# Patient Record
Sex: Female | Born: 1985
Health system: Southern US, Community
[De-identification: ages and names within clinical notes are randomized; demographics above are authoritative.]

## PROBLEM LIST (undated history)

## (undated) ENCOUNTER — Inpatient Hospital Stay (HOSPITAL_COMMUNITY): Payer: Self-pay

## (undated) DIAGNOSIS — F32A Depression, unspecified: Secondary | ICD-10-CM

## (undated) DIAGNOSIS — T7840XA Allergy, unspecified, initial encounter: Secondary | ICD-10-CM

## (undated) DIAGNOSIS — J45909 Unspecified asthma, uncomplicated: Secondary | ICD-10-CM

## (undated) DIAGNOSIS — F419 Anxiety disorder, unspecified: Secondary | ICD-10-CM

## (undated) DIAGNOSIS — N200 Calculus of kidney: Secondary | ICD-10-CM

## (undated) DIAGNOSIS — Z8659 Personal history of other mental and behavioral disorders: Secondary | ICD-10-CM

## (undated) DIAGNOSIS — N159 Renal tubulo-interstitial disease, unspecified: Secondary | ICD-10-CM

## (undated) DIAGNOSIS — Z3400 Encounter for supervision of normal first pregnancy, unspecified trimester: Secondary | ICD-10-CM

## (undated) HISTORY — DX: Allergy, unspecified, initial encounter: T78.40XA

## (undated) HISTORY — PX: APPENDECTOMY: SHX54

## (undated) HISTORY — DX: Encounter for supervision of normal first pregnancy, unspecified trimester: Z34.00

## (undated) HISTORY — DX: Personal history of other mental and behavioral disorders: Z86.59

## (undated) HISTORY — DX: Depression, unspecified: F32.A

## (undated) HISTORY — DX: Anxiety disorder, unspecified: F41.9

---
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
## 2007-05-10 ENCOUNTER — Other Ambulatory Visit: Admission: RE | Admit: 2007-05-10 | Discharge: 2007-05-10 | Payer: Self-pay | Admitting: Obstetrics & Gynecology

## 2007-07-03 ENCOUNTER — Emergency Department (HOSPITAL_COMMUNITY): Admission: EM | Admit: 2007-07-03 | Discharge: 2007-07-03 | Payer: Self-pay | Admitting: Emergency Medicine

## 2008-05-29 ENCOUNTER — Other Ambulatory Visit: Admission: RE | Admit: 2008-05-29 | Discharge: 2008-05-29 | Payer: Self-pay | Admitting: Obstetrics and Gynecology

## 2009-01-14 ENCOUNTER — Emergency Department (HOSPITAL_COMMUNITY): Admission: EM | Admit: 2009-01-14 | Discharge: 2009-01-14 | Payer: Self-pay | Admitting: Emergency Medicine

## 2009-01-14 ENCOUNTER — Ambulatory Visit: Payer: Self-pay | Admitting: *Deleted

## 2009-01-14 ENCOUNTER — Inpatient Hospital Stay (HOSPITAL_COMMUNITY): Admission: AD | Admit: 2009-01-14 | Discharge: 2009-01-17 | Payer: Self-pay | Admitting: *Deleted

## 2009-01-15 ENCOUNTER — Emergency Department (HOSPITAL_COMMUNITY): Admission: EM | Admit: 2009-01-15 | Discharge: 2009-01-15 | Payer: Self-pay | Admitting: Emergency Medicine

## 2009-05-14 ENCOUNTER — Ambulatory Visit: Payer: Self-pay | Admitting: Diagnostic Radiology

## 2009-05-14 ENCOUNTER — Emergency Department (HOSPITAL_BASED_OUTPATIENT_CLINIC_OR_DEPARTMENT_OTHER): Admission: EM | Admit: 2009-05-14 | Discharge: 2009-05-14 | Payer: Self-pay | Admitting: Emergency Medicine

## 2010-07-20 LAB — DIFFERENTIAL
Band Neutrophils: 3 % (ref 0–10)
Basophils Relative: 0 % (ref 0–1)
Eosinophils Relative: 1 % (ref 0–5)
Lymphocytes Relative: 7 % — ABNORMAL LOW (ref 12–46)
Lymphs Abs: 2.1 10*3/uL (ref 0.7–4.0)
Monocytes Absolute: 1.8 10*3/uL — ABNORMAL HIGH (ref 0.1–1.0)

## 2010-07-20 LAB — BASIC METABOLIC PANEL
CO2: 24 mEq/L (ref 19–32)
Chloride: 101 mEq/L (ref 96–112)
GFR calc Af Amer: >60 mL/min (ref >60)
Sodium: 137 mEq/L (ref 135–145)

## 2010-07-20 LAB — URINALYSIS, ROUTINE W REFLEX MICROSCOPIC
Glucose, UA: NEGATIVE mg/dL
Ketones, ur: 15 mg/dL — AB
Nitrite: NEGATIVE
Specific Gravity, Urine: 1.02 (ref 1.005–1.030)
pH: 6.5 (ref 5.0–8.0)

## 2010-07-20 LAB — CBC
Hemoglobin: 12.6 g/dL (ref 12.0–15.0)
MCHC: 34.3 g/dL (ref 30.0–36.0)
MCV: 83.4 fL (ref 78.0–100.0)
RBC: 4.41 MIL/uL (ref 3.87–5.11)
WBC: 29.4 10*3/uL — ABNORMAL HIGH (ref 4.0–10.5)

## 2010-07-20 LAB — PREGNANCY, URINE: Preg Test, Ur: NEGATIVE

## 2010-07-20 LAB — URINE MICROSCOPIC-ADD ON

## 2010-08-08 LAB — BASIC METABOLIC PANEL
BUN: 9 mg/dL (ref 6–23)
CO2: 25 mEq/L (ref 19–32)
Calcium: 9 mg/dL (ref 8.4–10.5)
Creatinine, Ser: 0.87 mg/dL (ref 0.4–1.2)
GFR calc non Af Amer: >60 mL/min (ref >60)
Glucose, Bld: 121 mg/dL — ABNORMAL HIGH (ref 70–99)
Sodium: 136 mEq/L (ref 135–145)

## 2010-08-08 LAB — URINALYSIS, ROUTINE W REFLEX MICROSCOPIC
Bilirubin Urine: NEGATIVE
Bilirubin Urine: NEGATIVE
Glucose, UA: NEGATIVE mg/dL
Leukocytes, UA: NEGATIVE
Nitrite: NEGATIVE
Protein, ur: NEGATIVE mg/dL
Specific Gravity, Urine: 1.029 (ref 1.005–1.030)
Urobilinogen, UA: 0.2 mg/dL (ref 0.0–1.0)
pH: 6 (ref 5.0–8.0)

## 2010-08-08 LAB — HEPATIC FUNCTION PANEL
ALT: 18 U/L (ref 0–35)
AST: 26 U/L (ref 0–37)
Albumin: 3.2 g/dL — ABNORMAL LOW (ref 3.5–5.2)
Total Bilirubin: 0.4 mg/dL (ref 0.3–1.2)

## 2010-08-08 LAB — RAPID URINE DRUG SCREEN, HOSP PERFORMED
Barbiturates: NOT DETECTED
Benzodiazepines: NOT DETECTED
Cocaine: NOT DETECTED

## 2010-08-08 LAB — PREGNANCY, URINE: Preg Test, Ur: NEGATIVE

## 2010-08-08 LAB — URINE MICROSCOPIC-ADD ON

## 2010-08-08 LAB — DIFFERENTIAL
Basophils Absolute: 0 10*3/uL (ref 0.0–0.1)
Basophils Relative: 0 % (ref 0–1)
Lymphocytes Relative: 22 % (ref 12–46)
Monocytes Absolute: 1 10*3/uL (ref 0.1–1.0)
Neutro Abs: 11.7 10*3/uL — ABNORMAL HIGH (ref 1.7–7.7)
Neutrophils Relative %: 71 % (ref 43–77)

## 2010-08-08 LAB — CBC
MCHC: 34.5 g/dL (ref 30.0–36.0)
Platelets: 445 10*3/uL — ABNORMAL HIGH (ref 150–400)
RDW: 12.1 % (ref 11.5–15.5)

## 2010-08-08 LAB — ACETAMINOPHEN LEVEL: Acetaminophen (Tylenol), Serum: <10 ug/mL — ABNORMAL LOW (ref 10–30)

## 2011-01-26 LAB — POCT PREGNANCY, URINE: Operator id: 285491

## 2011-04-10 ENCOUNTER — Emergency Department (HOSPITAL_BASED_OUTPATIENT_CLINIC_OR_DEPARTMENT_OTHER)
Admission: EM | Admit: 2011-04-10 | Discharge: 2011-04-11 | Disposition: A | Discharge status: Home / Self Care | Payer: BC Managed Care – PPO | Attending: Emergency Medicine | Admitting: Emergency Medicine

## 2011-04-10 DIAGNOSIS — R51 Headache: Secondary | ICD-10-CM | POA: Insufficient documentation

## 2011-04-10 DIAGNOSIS — F17200 Nicotine dependence, unspecified, uncomplicated: Secondary | ICD-10-CM | POA: Insufficient documentation

## 2011-04-10 HISTORY — DX: Renal tubulo-interstitial disease, unspecified: N15.9

## 2011-04-10 HISTORY — DX: Calculus of kidney: N20.0

## 2011-04-10 NOTE — ED Notes (Signed)
Pt states that she has a migraine headache with photo and phono-phobia.  Pt states that she also has nausea, pain 10/10.  Pt states that she was followed by headache specialist but stopped going because it wasn't helping, also has RX for nasal imitrex but never took it because "last time it didn't help so this time I took nothing and came here".

## 2011-04-11 MED ORDER — METOCLOPRAMIDE HCL 5 MG/ML IJ SOLN
10.0000 mg | Freq: Once | INTRAMUSCULAR | Status: AC
  Administered 2011-04-11: 10 mg via INTRAVENOUS
  Filled 2011-04-11: qty 2

## 2011-04-11 MED ORDER — KETOROLAC TROMETHAMINE 15 MG/ML IJ SOLN
15.0000 mg | Freq: Once | INTRAMUSCULAR | Status: AC
  Administered 2011-04-11: 15 mg via INTRAVENOUS
  Filled 2011-04-11: qty 1

## 2011-04-11 MED ORDER — SODIUM CHLORIDE 0.9 % IV BOLUS (SEPSIS)
1000.0000 mL | Freq: Once | INTRAVENOUS | Status: AC
  Administered 2011-04-11: 1000 mL via INTRAVENOUS

## 2011-04-11 MED ORDER — DIPHENHYDRAMINE HCL 50 MG/ML IJ SOLN
25.0000 mg | Freq: Once | INTRAMUSCULAR | Status: AC
  Administered 2011-04-11: 25 mg via INTRAVENOUS
  Filled 2011-04-11: qty 1

## 2011-04-11 NOTE — ED Provider Notes (Signed)
History    25yf with ha. No trauma. Gradual onset a few days prior. No fever/chills. Diffuse and reltively constant. No appreciable exacerbating or relieving factors. Nausea. No vomting. Photo/phonopobia. Hx of similar HAs. Denies use of bleeding diathesis.   CSN: [id number]  Arrival date & time: 04/10/2011 11:09 PM   First MD Initiated Contact with Patient 04/11/11 0110      Chief Complaint  Patient presents with  . Headache    (Consider location/radiation/quality/duration/timing/severity/associated sxs/prior treatment) HPI  Past Medical History  Diagnosis Date  . Migraine   . Kidney stone   . Kidney infection   . Concussion     History reviewed. No pertinent past surgical history.  History reviewed. No pertinent family history.  History  Substance Use Topics  . Smoking status: Current Everyday Smoker -- 0.2 packs/day for 6 years    Types: Cigarettes  . Smokeless tobacco: Never Used  . Alcohol Use: Yes    OB History    Grav Para Term Preterm Abortions TAB SAB Ect Mult Living                  Review of Systems   Review of symptoms negative unless otherwise noted in HPI.   Allergies  Review of patient's allergies indicates no known allergies.  Home Medications   Current Outpatient Rx  Name Route Sig Dispense Refill  . ALPRAZOLAM 0.5 MG PO TABS Oral Take 0.5 mg by mouth 2 (two) times daily as needed. For anxiety    . ONE-DAILY MULTI VITAMINS PO TABS Oral Take 1 tablet by mouth daily.      Azzie Roup ACE-ETH ESTRAD-FE 1.5-30 MG-MCG PO TABS Oral Take 1 tablet by mouth daily.      . SUMATRIPTAN 20 MG/ACT NA SOLN Nasal Place 1 spray into the nose every 2 (two) hours as needed. For migraine      BP 102/54  Pulse 104  Temp(Src) 97.9 F (36.6 C) (Oral)  Resp 15  Ht 5\' 1"  (1.549 m)  Wt 180 lb (81.647 kg)  BMI 34.01 kg/m2  SpO2 98%  LMP 03/27/2011  Physical Exam  Nursing note and vitals reviewed. Constitutional: She is oriented to person, place, and  time. She appears well-developed and well-nourished. No distress.  HENT:  Head: Normocephalic and atraumatic.  Right Ear: External ear normal.  Left Ear: External ear normal.  Mouth/Throat: Oropharynx is clear and moist.  Eyes: Conjunctivae are normal. Pupils are equal, round, and reactive to light. Right eye exhibits no discharge. Left eye exhibits no discharge.  Neck: Normal range of motion. Neck supple.  Cardiovascular: Normal rate, regular rhythm and normal heart sounds.  Exam reveals no gallop and no friction rub.   No murmur heard. Pulmonary/Chest: Effort normal and breath sounds normal. No respiratory distress.  Abdominal: Soft. She exhibits no distension. There is no tenderness.  Musculoskeletal: She exhibits no edema and no tenderness.  Lymphadenopathy:    She has no cervical adenopathy.  Neurological: She is alert and oriented to person, place, and time. A cranial nerve deficit is present. She exhibits normal muscle tone. Coordination normal.       Normal appearing gait. Good finger to nose b/l.  Skin: Skin is warm and dry.  Psychiatric: She has a normal mood and affect. Her behavior is normal. Thought content normal.    ED Course  Procedures (including critical care time)  Labs Reviewed - No data to display No results found.   1. Headache  MDM  25yF with HA. Atraumatic. Suspect primary HA. Hx of similar. Nofocal neuro exam. Hx of prior HAs. Consider secondary causes such as bleed, infectious, mass/psuedo tumor, venous thrombosis, CO poisoning but clinically doubt. Symptoms improved after tx and no new complaints prior to DC. Requesting to go home. Outpt fu.         Raeford Razor, MD 04/15/11 [phone]

## 2011-04-11 NOTE — ED Notes (Signed)
Pt states that she now feels better and wishes to go home.

## 2011-08-28 ENCOUNTER — Encounter (HOSPITAL_BASED_OUTPATIENT_CLINIC_OR_DEPARTMENT_OTHER): Payer: Self-pay | Admitting: *Deleted

## 2011-08-28 ENCOUNTER — Emergency Department (HOSPITAL_BASED_OUTPATIENT_CLINIC_OR_DEPARTMENT_OTHER)
Admission: EM | Admit: 2011-08-28 | Discharge: 2011-08-28 | Disposition: A | Discharge status: Home / Self Care | Payer: BC Managed Care – PPO | Attending: Emergency Medicine | Admitting: Emergency Medicine

## 2011-08-28 DIAGNOSIS — F17200 Nicotine dependence, unspecified, uncomplicated: Secondary | ICD-10-CM | POA: Insufficient documentation

## 2011-08-28 DIAGNOSIS — N390 Urinary tract infection, site not specified: Secondary | ICD-10-CM | POA: Insufficient documentation

## 2011-08-28 DIAGNOSIS — M549 Dorsalgia, unspecified: Secondary | ICD-10-CM | POA: Insufficient documentation

## 2011-08-28 DIAGNOSIS — Z87442 Personal history of urinary calculi: Secondary | ICD-10-CM | POA: Insufficient documentation

## 2011-08-28 LAB — URINE MICROSCOPIC-ADD ON

## 2011-08-28 LAB — URINALYSIS, ROUTINE W REFLEX MICROSCOPIC
Nitrite: NEGATIVE
Specific Gravity, Urine: 1.041 — ABNORMAL HIGH (ref 1.005–1.030)
pH: 5.5 (ref 5.0–8.0)

## 2011-08-28 LAB — PREGNANCY, URINE: Preg Test, Ur: NEGATIVE

## 2011-08-28 MED ORDER — SULFAMETHOXAZOLE-TRIMETHOPRIM 800-160 MG PO TABS: 1.0000 | ORAL_TABLET | Freq: Two times a day (BID) | ORAL | Status: AC

## 2011-08-28 MED ORDER — PHENAZOPYRIDINE HCL 200 MG PO TABS: 200.0000 mg | ORAL_TABLET | Freq: Three times a day (TID) | ORAL | Status: AC

## 2011-08-28 MED ORDER — PHENAZOPYRIDINE HCL 200 MG PO TABS: 200.0000 mg | ORAL_TABLET | Freq: Three times a day (TID) | ORAL | Status: DC

## 2011-08-28 NOTE — ED Notes (Signed)
Pt c/o lower back pain, difficulty with urination, painful urination, x 2 days

## 2011-08-28 NOTE — ED Provider Notes (Signed)
History     CSN: [id number]  Arrival date & time 08/28/11  1949   First MD Initiated Contact with Patient 08/28/11 1958      Chief Complaint  Patient presents with  . Back Pain    (Consider location/radiation/quality/duration/timing/severity/associated sxs/prior treatment) HPI Comments: Pt c/o lower back pain bilaterally:pain urination and hesitancy for the last 2 days:pt denies fever, n/v/d, or abdominal pain:pt denies vaginal discharge:pt has a history of kidney stone,but states that this doesn't feel similar  The history is provided by the patient. No language interpreter was used.    Past Medical History  Diagnosis Date  . Migraine   . Kidney stone   . Kidney infection   . Concussion     History reviewed. No pertinent past surgical history.  No family history on file.  History  Substance Use Topics  . Smoking status: Current Everyday Smoker -- 0.2 packs/day for 6 years    Types: Cigarettes  . Smokeless tobacco: Never Used  . Alcohol Use: Yes    OB History    Grav Para Term Preterm Abortions TAB SAB Ect Mult Living                  Review of Systems  Constitutional: Negative.   HENT: Negative.   Respiratory: Negative.   Cardiovascular: Negative.   Neurological: Negative.     Allergies  Review of patient's allergies indicates no known allergies.  Home Medications   Current Outpatient Rx  Name Route Sig Dispense Refill  . ALPRAZOLAM 0.5 MG PO TABS Oral Take 0.5 mg by mouth 2 (two) times daily as needed. For anxiety    . AMOXICILLIN 875 MG PO TABS Oral Take 875 mg by mouth 2 (two) times daily. Patient used this medication for her tooth infection.    Azzie Roup ACE-ETH ESTRAD-FE 1.5-30 MG-MCG PO TABS Oral Take 1 tablet by mouth daily.        BP 122/82  Pulse 120  Temp(Src) 98.4 F (36.9 C) (Oral)  Resp 20  Ht 5\' 1"  (1.549 m)  Wt 180 lb (81.647 kg)  BMI 34.01 kg/m2  SpO2 99%  LMP 08/19/2011  Physical Exam  Nursing note and vitals  reviewed. Constitutional: She is oriented to person, place, and time. She appears well-developed and well-nourished.  HENT:  Head: Normocephalic and atraumatic.  Eyes: EOM are normal.  Neck: Neck supple.  Cardiovascular: Normal rate and regular rhythm.   Pulmonary/Chest: Effort normal and breath sounds normal.  Abdominal: Soft. Bowel sounds are normal. There is no tenderness. There is no CVA tenderness.  Musculoskeletal: Normal range of motion.  Neurological: She is alert and oriented to person, place, and time.  Skin: Skin is warm and dry.  Psychiatric: She has a normal mood and affect.    ED Course  Procedures (including critical care time)  Labs Reviewed  URINALYSIS, ROUTINE W REFLEX MICROSCOPIC - Abnormal; Notable for the following:    Color, Urine AMBER (*) BIOCHEMICALS MAY BE AFFECTED BY COLOR   APPearance CLOUDY (*)    Specific Gravity, Urine 1.041 (*)    Bilirubin Urine SMALL (*)    Ketones, ur 15 (*)    Protein, ur 30 (*)    Leukocytes, UA SMALL (*)    All other components within normal limits  URINE MICROSCOPIC-ADD ON - Abnormal; Notable for the following:    Squamous Epithelial / LPF MANY (*)    Bacteria, UA FEW (*)    Crystals CA OXALATE CRYSTALS (*)  All other components within normal limits  PREGNANCY, URINE   No results found.   1. UTI (lower urinary tract infection)       MDM  Will treat for simple uti:pt is not pregnant        Teressa Lower, NP 08/28/11 2108

## 2011-08-28 NOTE — Discharge Instructions (Signed)

## 2011-08-28 NOTE — ED Notes (Signed)
Lower back/ flank pain. Increase in urinary frequency, burning when voiding.

## 2011-08-30 NOTE — ED Provider Notes (Signed)
Medical screening examination/treatment/procedure(s) were performed by non-physician practitioner and as supervising physician I was immediately available for consultation/collaboration.  Cyndra Numbers, MD 08/30/11 1011

## 2012-06-15 ENCOUNTER — Encounter (HOSPITAL_COMMUNITY): Payer: Self-pay | Admitting: *Deleted

## 2012-06-15 ENCOUNTER — Emergency Department (HOSPITAL_COMMUNITY)
Admission: EM | Admit: 2012-06-15 | Discharge: 2012-06-15 | Disposition: A | Discharge status: Home / Self Care | Payer: BC Managed Care – PPO | Attending: Emergency Medicine | Admitting: Emergency Medicine

## 2012-06-15 ENCOUNTER — Emergency Department (HOSPITAL_COMMUNITY): Payer: BC Managed Care – PPO

## 2012-06-15 DIAGNOSIS — R55 Syncope and collapse: Secondary | ICD-10-CM | POA: Insufficient documentation

## 2012-06-15 DIAGNOSIS — Z87442 Personal history of urinary calculi: Secondary | ICD-10-CM | POA: Insufficient documentation

## 2012-06-15 DIAGNOSIS — G43909 Migraine, unspecified, not intractable, without status migrainosus: Secondary | ICD-10-CM | POA: Insufficient documentation

## 2012-06-15 DIAGNOSIS — Z3202 Encounter for pregnancy test, result negative: Secondary | ICD-10-CM | POA: Insufficient documentation

## 2012-06-15 DIAGNOSIS — Z87828 Personal history of other (healed) physical injury and trauma: Secondary | ICD-10-CM | POA: Insufficient documentation

## 2012-06-15 DIAGNOSIS — R110 Nausea: Secondary | ICD-10-CM | POA: Insufficient documentation

## 2012-06-15 DIAGNOSIS — H53149 Visual discomfort, unspecified: Secondary | ICD-10-CM | POA: Insufficient documentation

## 2012-06-15 DIAGNOSIS — Z87440 Personal history of urinary (tract) infections: Secondary | ICD-10-CM | POA: Insufficient documentation

## 2012-06-15 DIAGNOSIS — Z79899 Other long term (current) drug therapy: Secondary | ICD-10-CM | POA: Insufficient documentation

## 2012-06-15 DIAGNOSIS — F17200 Nicotine dependence, unspecified, uncomplicated: Secondary | ICD-10-CM | POA: Insufficient documentation

## 2012-06-15 DIAGNOSIS — R51 Headache: Secondary | ICD-10-CM

## 2012-06-15 LAB — URINALYSIS, ROUTINE W REFLEX MICROSCOPIC
Bilirubin Urine: NEGATIVE
Glucose, UA: NEGATIVE mg/dL
Hgb urine dipstick: NEGATIVE
Ketones, ur: NEGATIVE mg/dL
Protein, ur: NEGATIVE mg/dL

## 2012-06-15 MED ORDER — DIPHENHYDRAMINE HCL 50 MG/ML IJ SOLN
25.0000 mg | Freq: Once | INTRAMUSCULAR | Status: AC
  Administered 2012-06-15: 25 mg via INTRAVENOUS
  Filled 2012-06-15: qty 1

## 2012-06-15 MED ORDER — SODIUM CHLORIDE 0.9 % IV BOLUS (SEPSIS)
1000.0000 mL | Freq: Once | INTRAVENOUS | Status: AC
  Administered 2012-06-15: 1000 mL via INTRAVENOUS

## 2012-06-15 MED ORDER — METOCLOPRAMIDE HCL 5 MG/ML IJ SOLN
20.0000 mg | Freq: Once | INTRAVENOUS | Status: AC
  Administered 2012-06-15: 20 mg via INTRAVENOUS
  Filled 2012-06-15: qty 4

## 2012-06-15 MED ORDER — DEXAMETHASONE SODIUM PHOSPHATE 10 MG/ML IJ SOLN
10.0000 mg | Freq: Once | INTRAMUSCULAR | Status: AC
  Administered 2012-06-15: 10 mg via INTRAVENOUS
  Filled 2012-06-15: qty 1

## 2012-06-15 MED ORDER — BUTALBITAL-APAP-CAFFEINE 50-325-40 MG PO TABS: 1.0000 | ORAL_TABLET | Freq: Two times a day (BID) | ORAL | Status: DC | PRN

## 2012-06-15 MED ORDER — KETOROLAC TROMETHAMINE 30 MG/ML IJ SOLN
30.0000 mg | Freq: Once | INTRAMUSCULAR | Status: AC
  Administered 2012-06-15: 30 mg via INTRAVENOUS
  Filled 2012-06-15: qty 1

## 2012-06-15 MED ORDER — METOCLOPRAMIDE HCL 10 MG PO TABS: 10.0000 mg | ORAL_TABLET | Freq: Four times a day (QID) | ORAL | Status: DC

## 2012-06-15 NOTE — ED Notes (Signed)
Pt states, "Caleb Popp is going to be picking me up & driving me home."

## 2012-06-15 NOTE — ED Notes (Signed)
Pt reports having migraine x 4 days, hx of same, no relief with meds at home. Had syncopal episode this am when getting out of shower, unsure if she hit her head. Having nausea, denies vomiting.

## 2012-06-15 NOTE — ED Provider Notes (Addendum)
History     CSN: [id number]  Arrival date & time 06/15/12  1046   First MD Initiated Contact with Patient 06/15/12 1129      Chief Complaint  Patient presents with  . Migraine  . Loss of Consciousness    (Consider location/radiation/quality/duration/timing/severity/associated sxs/prior treatment) HPI This patient has a long history of migraine headaches. She notes over the past 3 days she's had a persistent anterior bilateral throbbing sensation. There is an associated mild nausea, but no vomiting, no ataxia, no desquamation, no confusion. There is mild photophobia. Today, in the hours prior to presentation the patient had a syncopal episode. She is amnestic to the actual event, remembers awakening on the floor of her bathroom. No subsequent new head pain, new confusion, new chest pain, new dyspnea.  She states over the past 2 days her headache is not appreciably changed with any medication.  No clear exacerbating factors.  She has seen a neurologist in the past, has a formal diagnosis of migraines.  She has not seen a neurologist recently, nor had any imaging in at least 10 years.  Past Medical History  Diagnosis Date  . Migraine   . Kidney stone   . Kidney infection   . Concussion     History reviewed. No pertinent past surgical history.  History reviewed. No pertinent family history.  History  Substance Use Topics  . Smoking status: Current Every Day Smoker -- 0.25 packs/day for 6 years    Types: Cigarettes  . Smokeless tobacco: Never Used  . Alcohol Use: Yes    OB History   Grav Para Term Preterm Abortions TAB SAB Ect Mult Living                  Review of Systems  Neurological: Positive for syncope and headaches. Negative for weakness and numbness.    Allergies  Review of patient's allergies indicates no known allergies.  Home Medications   Current Outpatient Rx  Name  Route  Sig  Dispense  Refill  . ALPRAZolam (XANAX) 0.5 MG tablet   Oral   Take 0.5  mg by mouth 2 (two) times daily as needed for anxiety.          . butalbital-acetaminophen-caffeine (FIORICET, ESGIC) 50-325-40 MG per tablet   Oral   Take 1 tablet by mouth 2 (two) times daily as needed for headache.          . norethindrone-ethinyl estradiol (JUNEL FE,GILDESS FE,LOESTRIN FE) 1-20 MG-MCG tablet   Oral   Take 1 tablet by mouth daily.         . SUMAtriptan (IMITREX) 100 MG tablet   Oral   Take 100 mg by mouth every 2 (two) hours as needed for migraine.            BP 122/86  Pulse 101  Temp(Src) 98.3 F (36.8 C) (Oral)  Resp 18  SpO2 98%  LMP 06/01/2012  Physical Exam  Nursing note and vitals reviewed. Constitutional: She is oriented to person, place, and time. She appears well-developed and well-nourished. No distress.  HENT:  Head: Normocephalic and atraumatic.  Eyes: Conjunctivae and EOM are normal.  Neck: Full passive range of motion without pain. Neck supple.  Cardiovascular: Normal rate and regular rhythm.   Pulmonary/Chest: Effort normal and breath sounds normal. No stridor. No respiratory distress.  Abdominal: She exhibits no distension.  Musculoskeletal: She exhibits no edema.  Neurological: She is alert and oriented to person, place, and time. No cranial nerve  deficit. She exhibits normal muscle tone. Coordination normal.  Skin: Skin is warm and dry.  Psychiatric: She has a normal mood and affect.    ED Course  Procedures (including critical care time)  Labs Reviewed  URINALYSIS, ROUTINE W REFLEX MICROSCOPIC  POCT PREGNANCY, URINE   Ct Head Wo Contrast  06/15/2012  *RADIOLOGY REPORT*  Clinical Data: Headache.  Syncope.  CT HEAD WITHOUT CONTRAST  Technique:  Contiguous axial images were obtained from the base of the skull through the vertex without contrast.  Comparison: None.  Findings: The brain appears normal without infarct, hemorrhage, mass lesion, mass effect, midline shift or abnormal extra-axial fluid collection.  No  hydrocephalus or pneumocephalus.  Calvarium intact.  Imaged paranasal sinuses and mastoid air cells are clear.  IMPRESSION: Negative study.   Original Report Authenticated By: Holley Dexter, M.D.    O2- 99%ra, normal  No diagnosis found.  2:34 PM Patient is substantially better   MDM  This young female with history of migraine headaches presents after a more severe, more prolonged headache, and a syncopal episode.  The patient's amnesia to the event, unknown downtime, indicated the need for imaging.  This was unremarkable, and the patient improved substantially with analgesics, fluids.  She was discharged in stable condition.  With neurology follow-up        Gerhard Munch, MD 06/15/12 1435  Gerhard Munch, MD 06/15/12 1601

## 2012-06-15 NOTE — ED Notes (Signed)
Patient transported to CT 

## 2012-06-20 ENCOUNTER — Emergency Department (HOSPITAL_BASED_OUTPATIENT_CLINIC_OR_DEPARTMENT_OTHER): Payer: BC Managed Care – PPO

## 2012-06-20 ENCOUNTER — Emergency Department (HOSPITAL_BASED_OUTPATIENT_CLINIC_OR_DEPARTMENT_OTHER)
Admission: EM | Admit: 2012-06-20 | Discharge: 2012-06-20 | Disposition: A | Discharge status: Home / Self Care | Payer: BC Managed Care – PPO | Attending: Emergency Medicine | Admitting: Emergency Medicine

## 2012-06-20 ENCOUNTER — Encounter (HOSPITAL_BASED_OUTPATIENT_CLINIC_OR_DEPARTMENT_OTHER): Payer: Self-pay | Admitting: Family Medicine

## 2012-06-20 DIAGNOSIS — R300 Dysuria: Secondary | ICD-10-CM | POA: Insufficient documentation

## 2012-06-20 DIAGNOSIS — Z79899 Other long term (current) drug therapy: Secondary | ICD-10-CM | POA: Insufficient documentation

## 2012-06-20 DIAGNOSIS — N200 Calculus of kidney: Secondary | ICD-10-CM | POA: Insufficient documentation

## 2012-06-20 DIAGNOSIS — Z3202 Encounter for pregnancy test, result negative: Secondary | ICD-10-CM | POA: Insufficient documentation

## 2012-06-20 DIAGNOSIS — R319 Hematuria, unspecified: Secondary | ICD-10-CM | POA: Insufficient documentation

## 2012-06-20 DIAGNOSIS — Z87448 Personal history of other diseases of urinary system: Secondary | ICD-10-CM | POA: Insufficient documentation

## 2012-06-20 DIAGNOSIS — F17200 Nicotine dependence, unspecified, uncomplicated: Secondary | ICD-10-CM | POA: Insufficient documentation

## 2012-06-20 DIAGNOSIS — Z87828 Personal history of other (healed) physical injury and trauma: Secondary | ICD-10-CM | POA: Insufficient documentation

## 2012-06-20 DIAGNOSIS — G43909 Migraine, unspecified, not intractable, without status migrainosus: Secondary | ICD-10-CM | POA: Insufficient documentation

## 2012-06-20 LAB — CBC WITH DIFFERENTIAL/PLATELET
Basophils Relative: 0 % (ref 0–1)
Eosinophils Absolute: 0.3 10*3/uL (ref 0.0–0.7)
HCT: 39.6 % (ref 36.0–46.0)
Hemoglobin: 13.5 g/dL (ref 12.0–15.0)
Lymphs Abs: 4.1 10*3/uL — ABNORMAL HIGH (ref 0.7–4.0)
MCH: 28.7 pg (ref 26.0–34.0)
MCHC: 34.1 g/dL (ref 30.0–36.0)
MCV: 84.1 fL (ref 78.0–100.0)
Monocytes Absolute: 1.1 10*3/uL — ABNORMAL HIGH (ref 0.1–1.0)
Monocytes Relative: 6 % (ref 3–12)
RBC: 4.71 MIL/uL (ref 3.87–5.11)

## 2012-06-20 LAB — URINALYSIS, ROUTINE W REFLEX MICROSCOPIC
Hgb urine dipstick: NEGATIVE
Ketones, ur: 15 mg/dL — AB
Nitrite: NEGATIVE
Specific Gravity, Urine: 1.023 (ref 1.005–1.030)
Urobilinogen, UA: 1 mg/dL (ref 0.0–1.0)
pH: 6 (ref 5.0–8.0)

## 2012-06-20 LAB — BASIC METABOLIC PANEL
BUN: 12 mg/dL (ref 6–23)
Creatinine, Ser: 1 mg/dL (ref 0.50–1.10)
GFR calc Af Amer: >90 mL/min (ref >90)
GFR calc non Af Amer: 78 mL/min — ABNORMAL LOW (ref >90)
Glucose, Bld: 90 mg/dL (ref 70–99)

## 2012-06-20 MED ORDER — OXYCODONE-ACETAMINOPHEN 5-325 MG PO TABS: 2.0000 | ORAL_TABLET | ORAL | Status: DC | PRN

## 2012-06-20 MED ORDER — MORPHINE SULFATE 4 MG/ML IJ SOLN
4.0000 mg | Freq: Once | INTRAMUSCULAR | Status: AC
  Administered 2012-06-20: 4 mg via INTRAVENOUS
  Filled 2012-06-20: qty 1

## 2012-06-20 MED ORDER — HYDROMORPHONE HCL PF 1 MG/ML IJ SOLN
1.0000 mg | Freq: Once | INTRAMUSCULAR | Status: AC
  Administered 2012-06-20: 1 mg via INTRAVENOUS
  Filled 2012-06-20: qty 1

## 2012-06-20 MED ORDER — SODIUM CHLORIDE 0.9 % IV BOLUS (SEPSIS)
1000.0000 mL | Freq: Once | INTRAVENOUS | Status: AC
  Administered 2012-06-20: 1000 mL via INTRAVENOUS

## 2012-06-20 MED ORDER — ONDANSETRON HCL 4 MG/2ML IJ SOLN
4.0000 mg | Freq: Once | INTRAMUSCULAR | Status: AC
  Administered 2012-06-20: 4 mg via INTRAVENOUS
  Filled 2012-06-20: qty 2

## 2012-06-20 MED ORDER — KETOROLAC TROMETHAMINE 30 MG/ML IJ SOLN
30.0000 mg | Freq: Once | INTRAMUSCULAR | Status: AC
  Administered 2012-06-20: 30 mg via INTRAVENOUS
  Filled 2012-06-20: qty 1

## 2012-06-20 NOTE — ED Notes (Signed)
Pt states that while her pain has improved she would like another dose of pain medication.  Pt states that she was also told she would have a CT scan performed, none ordered at present.  EDP notified.

## 2012-06-20 NOTE — ED Provider Notes (Signed)
History     CSN: [id number]  Arrival date & time 06/20/12  1825   First MD Initiated Contact with Patient 06/20/12 1831      Chief Complaint  Patient presents with  . Flank Pain  . Hematuria    (Consider location/radiation/quality/duration/timing/severity/associated sxs/prior treatment) HPI Comments: Patient is a 27 year old female with a past medical history of kidney stones who presents with left flank pain that started this morning. Patient reports sudden onset and progressive worsening of pain since the onset. The pain is sharp and severe and radiates down her back. Patient reports this is exactly what her kidney stones feel like. She has not tried anything for pain. No aggravating/alleviating factors. Associated symptoms include hematuria and dysuria.    Past Medical History  Diagnosis Date  . Migraine   . Kidney stone   . Kidney infection   . Concussion     History reviewed. No pertinent past surgical history.  No family history on file.  History  Substance Use Topics  . Smoking status: Current Every Day Smoker -- 0.25 packs/day for 6 years    Types: Cigarettes  . Smokeless tobacco: Never Used  . Alcohol Use: Yes    OB History   Grav Para Term Preterm Abortions TAB SAB Ect Mult Living                  Review of Systems  Genitourinary: Positive for dysuria, hematuria and flank pain.  All other systems reviewed and are negative.    Allergies  Review of patient's allergies indicates no known allergies.  Home Medications   Current Outpatient Rx  Name  Route  Sig  Dispense  Refill  . ALPRAZolam (XANAX) 0.5 MG tablet   Oral   Take 0.5 mg by mouth 2 (two) times daily as needed for anxiety.          . butalbital-acetaminophen-caffeine (FIORICET, ESGIC) 50-325-40 MG per tablet   Oral   Take 1 tablet by mouth 2 (two) times daily as needed for headache.   14 tablet   0   . metoCLOPramide (REGLAN) 10 MG tablet   Oral   Take 1 tablet (10 mg total) by  mouth every 6 (six) hours.   20 tablet   0   . norethindrone-ethinyl estradiol (JUNEL FE,GILDESS FE,LOESTRIN FE) 1-20 MG-MCG tablet   Oral   Take 1 tablet by mouth daily.         . SUMAtriptan (IMITREX) 100 MG tablet   Oral   Take 100 mg by mouth every 2 (two) hours as needed for migraine.            BP 127/76  Pulse 90  Temp(Src) 97.8 F (36.6 C) (Oral)  Resp 18  SpO2 100%  LMP 06/18/2012  Physical Exam  Nursing note and vitals reviewed. Constitutional: She is oriented to person, place, and time. She appears well-developed and well-nourished. No distress.  HENT:  Head: Normocephalic and atraumatic.  Eyes: Conjunctivae and EOM are normal.  Neck: Normal range of motion.  Cardiovascular: Normal rate and regular rhythm.  Exam reveals no gallop and no friction rub.   No murmur heard. Pulmonary/Chest: Effort normal and breath sounds normal. She has no wheezes. She has no rales. She exhibits no tenderness.  Abdominal: Soft. She exhibits no distension. There is no tenderness. There is no rebound.  Genitourinary:  Left CVA tenderness   Musculoskeletal: Normal range of motion.  Neurological: She is alert and oriented to person,  place, and time. Coordination normal.  Speech is goal-oriented. Moves limbs without ataxia.   Skin: Skin is warm and dry.  Psychiatric: She has a normal mood and affect. Her behavior is normal.    ED Course  Procedures (including critical care time)  Labs Reviewed  CBC WITH DIFFERENTIAL - Abnormal; Notable for the following:    WBC 18.7 (*)    Platelets 446 (*)    Neutro Abs 13.2 (*)    Lymphs Abs 4.1 (*)    Monocytes Absolute 1.1 (*)    All other components within normal limits  BASIC METABOLIC PANEL - Abnormal; Notable for the following:    GFR calc non Af Amer 78 (*)    All other components within normal limits  URINALYSIS, ROUTINE W REFLEX MICROSCOPIC - Abnormal; Notable for the following:    APPearance CLOUDY (*)    Bilirubin Urine  SMALL (*)    Ketones, ur 15 (*)    All other components within normal limits  PREGNANCY, URINE   Ct Abdomen Pelvis Wo Contrast  06/20/2012  *RADIOLOGY REPORT*  Clinical Data: Right flank pain  CT ABDOMEN AND PELVIS WITHOUT CONTRAST  Technique:  Multidetector CT imaging of the abdomen and pelvis was performed following the standard protocol without intravenous contrast.  Comparison: Prior CT abdomen/pelvis 05/14/2009  Findings:  Lower Chest:  The lung bases are clear.  Imaged cardiac structures within normal limits for size.  Unremarkable distal thoracic esophagus.  Abdomen: Limited evaluation of the abdominal organs and vasculature in the absence of intravenous contrast material.  Within these limitations, unremarkable CT appearance of the stomach, duodenum, spleen, adrenal glands, pancreas, and liver. Gallbladder is unremarkable. No intra or extrahepatic biliary ductal dilatation.  Moderate left hydronephrosis.  There are bilateral renal calculi. At least three punctate calculi are identified in the left renal collecting system measuring up to 3 mm in diameter.  There is a single 1 - 2 mm calculus in the lower pole collecting system in the right kidney. Overall, the stone disease appears slightly progressed compared to 05/14/2009.  Nondilated bowel throughout the abdomen.  No evidence of obstruction or focal bowel wall thickening.  Normal appendix in the right lower quadrant.  Pelvis: There is a 5 x 6 mm at least partially obstructing stone in the distal left ureter just proximal to the UVJ.  The bladder is underdistended.  There is mild left ureterectasis.  No free fluid or suspicious adenopathy.  Unremarkable uterus and adnexa.  Bones: No acute fracture or aggressive appearing lytic or blastic osseous lesion.  Vascular: Limited evaluation the absence of intravenous contrast material.  No focal vascular abnormality.  IMPRESSION:  1.  At least partially obstructing 5 x 6 mm distal left ureteral stone with  associated moderate left hydronephrosis.  2.  Additional nonobstructing renal calculi bilaterally with the stone burden greater on the left than the right.   Original Report Authenticated By: Malachy Moan, M.D.      1. Kidney stone       MDM  7:05 PM Labs and urinalysis pending. Patient will have fluids, toradol, morphine and zofran for symptoms.   10:37 PM Patient reports relief with pain medication. CT scan shows a 5x52mm stone which is likely causing the patient's pain. Labs unremarkable. WBC elevation noted on previous lab studies. Urinalysis shows no infection. Patient not pregnant. Patient instructed to drink plenty of fluids. Vitals stable and patient afebrile. Patient will be discharged with Percocet for pain and Urology follow up. Patient  instructed to return with worsening or concerning symptoms.       Emilia Beck, PA-C 06/20/12 2242

## 2012-06-20 NOTE — ED Provider Notes (Signed)
  Medical screening examination/treatment/procedure(s) were performed by non-physician practitioner and as supervising physician I was immediately available for consultation/collaboration.    Derius Ghosh, MD 06/20/12 2334 

## 2012-06-20 NOTE — ED Notes (Signed)
Pt sent here from [facility] for acute onset left flank pain this morning and hematuria. Pt has h/o kidney stones.

## 2012-08-23 ENCOUNTER — Encounter (HOSPITAL_COMMUNITY): Payer: Self-pay | Admitting: Emergency Medicine

## 2012-08-23 ENCOUNTER — Emergency Department (HOSPITAL_COMMUNITY)
Admission: EM | Admit: 2012-08-23 | Discharge: 2012-08-23 | Disposition: A | Discharge status: Home / Self Care | Payer: BC Managed Care – PPO | Attending: Emergency Medicine | Admitting: Emergency Medicine

## 2012-08-23 DIAGNOSIS — Z87442 Personal history of urinary calculi: Secondary | ICD-10-CM | POA: Insufficient documentation

## 2012-08-23 DIAGNOSIS — K0381 Cracked tooth: Secondary | ICD-10-CM | POA: Insufficient documentation

## 2012-08-23 DIAGNOSIS — G43909 Migraine, unspecified, not intractable, without status migrainosus: Secondary | ICD-10-CM | POA: Insufficient documentation

## 2012-08-23 DIAGNOSIS — K0889 Other specified disorders of teeth and supporting structures: Secondary | ICD-10-CM

## 2012-08-23 DIAGNOSIS — Z87828 Personal history of other (healed) physical injury and trauma: Secondary | ICD-10-CM | POA: Insufficient documentation

## 2012-08-23 DIAGNOSIS — Z79899 Other long term (current) drug therapy: Secondary | ICD-10-CM | POA: Insufficient documentation

## 2012-08-23 DIAGNOSIS — F17200 Nicotine dependence, unspecified, uncomplicated: Secondary | ICD-10-CM | POA: Insufficient documentation

## 2012-08-23 DIAGNOSIS — K089 Disorder of teeth and supporting structures, unspecified: Secondary | ICD-10-CM | POA: Insufficient documentation

## 2012-08-23 DIAGNOSIS — Z87440 Personal history of urinary (tract) infections: Secondary | ICD-10-CM | POA: Insufficient documentation

## 2012-08-23 DIAGNOSIS — K029 Dental caries, unspecified: Secondary | ICD-10-CM | POA: Insufficient documentation

## 2012-08-23 MED ORDER — HYDROCODONE-ACETAMINOPHEN 5-325 MG PO TABS: 1.0000 | ORAL_TABLET | ORAL | Status: DC | PRN

## 2012-08-23 MED ORDER — PENICILLIN V POTASSIUM 500 MG PO TABS: 500.0000 mg | ORAL_TABLET | Freq: Three times a day (TID) | ORAL | Status: DC

## 2012-08-23 NOTE — ED Provider Notes (Signed)
Medical screening examination/treatment/procedure(s) were performed by non-physician practitioner and as supervising physician I was immediately available for consultation/collaboration.  Caden Fukushima M Nesanel Aguila, MD 08/23/12 0604 

## 2012-08-23 NOTE — ED Notes (Signed)
PT. REPORTS LEFT LOWER MOLAR PAIN " IT CRACKED"  ONSET THIS EVENING UNRELIEVED BY OTC TYLENOL AND IBUPROFEN .

## 2012-08-23 NOTE — ED Provider Notes (Signed)
History     CSN: [id number]  Arrival date & time 08/23/12  0120   None     Chief Complaint  Patient presents with  . Dental Pain    (Consider location/radiation/quality/duration/timing/severity/associated sxs/prior treatment) HPI History provided by pt.   Pt presents w/ severe L lower toothache x 2 days, since breaking it on piece of steak.  Radiates to entire L jaw.  No associated fever.  Relieved by ibuprofen initially only.  Has a dentist but has not scheduled follow up yet.  Past Medical History  Diagnosis Date  . Migraine   . Kidney stone   . Kidney infection   . Concussion     History reviewed. No pertinent past surgical history.  No family history on file.  History  Substance Use Topics  . Smoking status: Current Every Day Smoker -- 0.25 packs/day for 6 years    Types: Cigarettes  . Smokeless tobacco: Never Used  . Alcohol Use: Yes    OB History   Grav Para Term Preterm Abortions TAB SAB Ect Mult Living                  Review of Systems  All other systems reviewed and are negative.    Allergies  Review of patient's allergies indicates no known allergies.  Home Medications   Current Outpatient Rx  Name  Route  Sig  Dispense  Refill  . ALPRAZolam (XANAX) 0.5 MG tablet   Oral   Take 0.5 mg by mouth 2 (two) times daily as needed for anxiety.          Marland Kitchen buPROPion (WELLBUTRIN XL) 150 MG 24 hr tablet   Oral   Take 150 mg by mouth daily.         . butalbital-acetaminophen-caffeine (FIORICET, ESGIC) 50-325-40 MG per tablet   Oral   Take 1 tablet by mouth 2 (two) times daily as needed for headache.   14 tablet   0   . norethindrone-ethinyl estradiol (JUNEL FE,GILDESS FE,LOESTRIN FE) 1-20 MG-MCG tablet   Oral   Take 1 tablet by mouth daily.         . ondansetron (ZOFRAN) 4 MG tablet   Oral   Take 4 mg by mouth every 8 (eight) hours as needed for nausea.         . SUMAtriptan (IMITREX) 100 MG tablet   Oral   Take 100 mg by mouth every  2 (two) hours as needed for migraine.          Marland Kitchen HYDROcodone-acetaminophen (NORCO/VICODIN) 5-325 MG per tablet   Oral   Take 1 tablet by mouth every 4 (four) hours as needed for pain.   12 tablet   0   . penicillin v potassium (VEETID) 500 MG tablet   Oral   Take 1 tablet (500 mg total) by mouth 3 (three) times daily.   30 tablet   0     BP 138/92  Pulse 102  Temp(Src) 98.7 F (37.1 C) (Oral)  Resp 14  SpO2 98%  LMP 08/15/2012  Physical Exam  Nursing note and vitals reviewed. Constitutional: She is oriented to person, place, and time. She appears well-developed and well-nourished.  HENT:  Head: Normocephalic and atraumatic. No trismus in the jaw.  Mouth/Throat: Uvula is midline and oropharynx is clear and moist.  Fracture of left lower second molar.  Carie in center of tooth.  Ttp.  Adjacent gingiva appears normal.  No edema of buccal mucosa.  Eyes:  Normal appearance  Neck: Normal range of motion. Neck supple.  No submandibular edema  Lymphadenopathy:    She has no cervical adenopathy.  Neurological: She is alert and oriented to person, place, and time.  Psychiatric: She has a normal mood and affect. Her behavior is normal.    ED Course  Procedures (including critical care time)  Labs Reviewed - No data to display No results found.   1. Pain, dental       MDM  27yo F presents w/ dental fracture/pain.  Carie in center of tooth and ttp.  Will treat for possible periapical abscess w/ penicillin as well as 12 vicodin.  She will contact her dentist in am for f/u.  Return precautions discussed. 3:41 AM   Otilio Miu, PA-C 08/23/12 [phone]

## 2012-08-23 NOTE — ED Notes (Signed)
The patient is AOx4 and comfortable with the discharge instructions. 

## 2012-10-31 ENCOUNTER — Ambulatory Visit: Payer: BC Managed Care – PPO | Admitting: Family Medicine

## 2012-10-31 DIAGNOSIS — Z0289 Encounter for other administrative examinations: Secondary | ICD-10-CM

## 2012-11-23 ENCOUNTER — Encounter (HOSPITAL_COMMUNITY): Payer: Self-pay | Admitting: *Deleted

## 2012-11-23 ENCOUNTER — Emergency Department (HOSPITAL_COMMUNITY)
Admission: EM | Admit: 2012-11-23 | Discharge: 2012-11-23 | Disposition: A | Discharge status: Home / Self Care | Payer: BC Managed Care – PPO | Attending: Emergency Medicine | Admitting: Emergency Medicine

## 2012-11-23 DIAGNOSIS — R110 Nausea: Secondary | ICD-10-CM | POA: Insufficient documentation

## 2012-11-23 DIAGNOSIS — R300 Dysuria: Secondary | ICD-10-CM | POA: Insufficient documentation

## 2012-11-23 DIAGNOSIS — Z87828 Personal history of other (healed) physical injury and trauma: Secondary | ICD-10-CM | POA: Insufficient documentation

## 2012-11-23 DIAGNOSIS — R109 Unspecified abdominal pain: Secondary | ICD-10-CM | POA: Insufficient documentation

## 2012-11-23 DIAGNOSIS — Z79899 Other long term (current) drug therapy: Secondary | ICD-10-CM | POA: Insufficient documentation

## 2012-11-23 DIAGNOSIS — M545 Low back pain, unspecified: Secondary | ICD-10-CM | POA: Insufficient documentation

## 2012-11-23 DIAGNOSIS — Z87442 Personal history of urinary calculi: Secondary | ICD-10-CM

## 2012-11-23 DIAGNOSIS — G43909 Migraine, unspecified, not intractable, without status migrainosus: Secondary | ICD-10-CM | POA: Insufficient documentation

## 2012-11-23 DIAGNOSIS — F17200 Nicotine dependence, unspecified, uncomplicated: Secondary | ICD-10-CM | POA: Insufficient documentation

## 2012-11-23 DIAGNOSIS — R319 Hematuria, unspecified: Secondary | ICD-10-CM | POA: Insufficient documentation

## 2012-11-23 DIAGNOSIS — Z3202 Encounter for pregnancy test, result negative: Secondary | ICD-10-CM | POA: Insufficient documentation

## 2012-11-23 DIAGNOSIS — Z87448 Personal history of other diseases of urinary system: Secondary | ICD-10-CM | POA: Insufficient documentation

## 2012-11-23 LAB — URINALYSIS, ROUTINE W REFLEX MICROSCOPIC
Bilirubin Urine: NEGATIVE
Glucose, UA: NEGATIVE mg/dL
Ketones, ur: NEGATIVE mg/dL
Nitrite: NEGATIVE
Protein, ur: NEGATIVE mg/dL
Specific Gravity, Urine: 1.017 (ref 1.005–1.030)
Urobilinogen, UA: 0.2 mg/dL (ref 0.0–1.0)
pH: 6.5 (ref 5.0–8.0)

## 2012-11-23 LAB — URINE MICROSCOPIC-ADD ON

## 2012-11-23 LAB — POCT PREGNANCY, URINE: Preg Test, Ur: NEGATIVE

## 2012-11-23 MED ORDER — IBUPROFEN 800 MG PO TABS: 800.0000 mg | ORAL_TABLET | Freq: Three times a day (TID) | ORAL | Status: DC

## 2012-11-23 MED ORDER — OXYCODONE-ACETAMINOPHEN 5-325 MG PO TABS: ORAL_TABLET | ORAL | Status: DC

## 2012-11-23 NOTE — ED Provider Notes (Signed)
History    CSN: [id number] Arrival date & time 11/23/12  1021  First MD Initiated Contact with Patient 11/23/12 1130     Chief Complaint  Patient presents with  . Back Pain  . Nausea   (Consider location/radiation/quality/duration/timing/severity/associated sxs/prior Treatment) HPI Pt is a 27yo female with hx of kidney stone and kidney infection c/o right lower back pain and dysuria associated with some nausea that started this morning.  Pain is achy and sharp in right lower back, 7/10, radiating into suprapubic region.  Pt states its difficult to get into comfortable position.  Has noticed burning with urination and very dark urine. Has not taken any medication PTA.  Reports similar symptoms as last time when she was dx with kidney stone in Feb 2014.  Reports 3-4 other previous episodes of stones and infections but has never required stents or lithotripsy.  Has been referred to urology but has not gone yet. States she feels like it's time to go now that she keeps getting these symptoms.  Reports younger sister has required stents in past for stones.  Denies fever, vomiting or diarrhea.   Past Medical History  Diagnosis Date  . Migraine   . Kidney stone   . Kidney infection   . Concussion    History reviewed. No pertinent past surgical history. No family history on file. History  Substance Use Topics  . Smoking status: Current Every Day Smoker -- 0.25 packs/day for 6 years    Types: Cigarettes  . Smokeless tobacco: Never Used  . Alcohol Use: Yes   OB History   Grav Para Term Preterm Abortions TAB SAB Ect Mult Living                 Review of Systems  Constitutional: Negative for fever and chills.  Gastrointestinal: Positive for nausea. Negative for vomiting, abdominal pain and diarrhea.  Genitourinary: Positive for dysuria, hematuria and flank pain. Negative for urgency, frequency, decreased urine volume, vaginal bleeding, vaginal discharge, vaginal pain and pelvic pain.   All other systems reviewed and are negative.    Allergies  Review of patient's allergies indicates no known allergies.  Home Medications   Current Outpatient Rx  Name  Route  Sig  Dispense  Refill  . buPROPion (WELLBUTRIN XL) 150 MG 24 hr tablet   Oral   Take 150 mg by mouth daily.         Marland Kitchen ibuprofen (ADVIL,MOTRIN) 200 MG tablet   Oral   Take 800 mg by mouth every 6 (six) hours as needed for pain.         Marland Kitchen norethindrone-ethinyl estradiol (JUNEL FE,GILDESS FE,LOESTRIN FE) 1-20 MG-MCG tablet   Oral   Take 1 tablet by mouth daily.         . ondansetron (ZOFRAN) 4 MG tablet   Oral   Take 4 mg by mouth every 8 (eight) hours as needed for nausea.         . SUMAtriptan (IMITREX) 100 MG tablet   Oral   Take 100 mg by mouth every 2 (two) hours as needed for migraine.          Marland Kitchen ibuprofen (ADVIL,MOTRIN) 800 MG tablet   Oral   Take 1 tablet (800 mg total) by mouth 3 (three) times daily.   21 tablet   0   . oxyCODONE-acetaminophen (PERCOCET/ROXICET) 5-325 MG per tablet      Take 1-2 tabs every 4-6 hours as needed for pain.   6 tablet  0    BP 123/83  Pulse 94  Temp(Src) 97.6 F (36.4 C) (Oral)  Resp 18  Ht 5\' 1"  (1.549 m)  Wt 190 lb (86.183 kg)  BMI 35.92 kg/m2  SpO2 100% Physical Exam  Nursing note and vitals reviewed. Constitutional: She appears well-developed and well-nourished.  Pt lying in exam bed, appears uncomfortable.   HENT:  Head: Normocephalic and atraumatic.  Eyes: Conjunctivae are normal. No scleral icterus.  Neck: Normal range of motion. Neck supple.  Cardiovascular: Normal rate, regular rhythm and normal heart sounds.   Pulmonary/Chest: Effort normal and breath sounds normal. No respiratory distress. She has no wheezes. She has no rales. She exhibits no tenderness.  Abdominal: Soft. Bowel sounds are normal. She exhibits no distension and no mass. There is no tenderness. There is no rebound and no guarding.  Right CVAT.  Abd-soft, NDNT   Musculoskeletal: Normal range of motion.  Neurological: She is alert.  Skin: Skin is warm and dry.    ED Course  Procedures (including critical care time) Labs Reviewed  URINALYSIS, ROUTINE W REFLEX MICROSCOPIC - Abnormal; Notable for the following:    APPearance CLOUDY (*)    Hgb urine dipstick LARGE (*)    Leukocytes, UA TRACE (*)    All other components within normal limits  URINE MICROSCOPIC-ADD ON - Abnormal; Notable for the following:    Squamous Epithelial / LPF MANY (*)    All other components within normal limits  POCT PREGNANCY, URINE   No results found. 1. History of kidney stones   2. Hematuria   3. Right low back pain   4. Dysuria     MDM  Pt reports hx of kidney stones, last being in Feb 2014.  Similar symptoms today.  UA: hematuria, not concerning for UTI.  Will have pt f/u with [facility] as this is her 3 or 4th episode of probable kidney stones and reports family hx of same in younger sister.  Provided info packet on kidney stones and proper diet. Rx: percocet and ibuprofen.  Encouraged to drink plenty of water and clear fluids today to help facilitate passage of stone.  Strict return precautions given for signs of possible infection.   Pt verbalized understanding and agreement with tx plan. Vitals: unremarkable. Discharged in stable condition.       Junius Finner, PA-C 11/23/12 1337

## 2012-11-23 NOTE — ED Notes (Signed)
Pt is here with right lower back pain since early am associated with some nausea.  History of kidney stones

## 2012-11-27 NOTE — ED Provider Notes (Signed)
Medical screening examination/treatment/procedure(s) were performed by non-physician practitioner and as supervising physician I was immediately available for consultation/collaboration.  Raeford Razor, MD 11/27/12 1022

## 2013-04-24 ENCOUNTER — Encounter: Payer: Self-pay | Admitting: Family Medicine

## 2013-04-24 ENCOUNTER — Ambulatory Visit (INDEPENDENT_AMBULATORY_CARE_PROVIDER_SITE_OTHER): Payer: BC Managed Care – PPO | Admitting: Family Medicine

## 2013-04-24 VITALS — BP 126/82 | HR 96 | Temp 98.2°F | Ht 61.0 in | Wt 183.5 lb

## 2013-04-24 DIAGNOSIS — F341 Dysthymic disorder: Secondary | ICD-10-CM

## 2013-04-24 DIAGNOSIS — Z0000 Encounter for general adult medical examination without abnormal findings: Secondary | ICD-10-CM | POA: Insufficient documentation

## 2013-04-24 DIAGNOSIS — Z23 Encounter for immunization: Secondary | ICD-10-CM

## 2013-04-24 DIAGNOSIS — Z131 Encounter for screening for diabetes mellitus: Secondary | ICD-10-CM

## 2013-04-24 DIAGNOSIS — F418 Other specified anxiety disorders: Secondary | ICD-10-CM

## 2013-04-24 DIAGNOSIS — L680 Hirsutism: Secondary | ICD-10-CM | POA: Insufficient documentation

## 2013-04-24 DIAGNOSIS — N912 Amenorrhea, unspecified: Secondary | ICD-10-CM

## 2013-04-24 DIAGNOSIS — Z87442 Personal history of urinary calculi: Secondary | ICD-10-CM

## 2013-04-24 DIAGNOSIS — L709 Acne, unspecified: Secondary | ICD-10-CM

## 2013-04-24 DIAGNOSIS — Z13220 Encounter for screening for lipoid disorders: Secondary | ICD-10-CM

## 2013-04-24 DIAGNOSIS — G43909 Migraine, unspecified, not intractable, without status migrainosus: Secondary | ICD-10-CM

## 2013-04-24 DIAGNOSIS — L708 Other acne: Secondary | ICD-10-CM

## 2013-04-24 MED ORDER — ONDANSETRON HCL 4 MG PO TABS: 4.0000 mg | ORAL_TABLET | Freq: Three times a day (TID) | ORAL | Status: DC | PRN

## 2013-04-24 MED ORDER — DROSPIRENONE-ETHINYL ESTRADIOL 3-0.03 MG PO TABS: 1.0000 | ORAL_TABLET | Freq: Every day | ORAL | Status: DC

## 2013-04-24 MED ORDER — ALPRAZOLAM 0.5 MG PO TABS: 0.5000 mg | ORAL_TABLET | Freq: Two times a day (BID) | ORAL | Status: DC | PRN

## 2013-04-24 MED ORDER — SUMATRIPTAN SUCCINATE 100 MG PO TABS: 100.0000 mg | ORAL_TABLET | ORAL | Status: DC | PRN

## 2013-04-24 MED ORDER — BUPROPION HCL ER (XL) 150 MG PO TB24: 150.0000 mg | ORAL_TABLET | Freq: Every day | ORAL | Status: DC

## 2013-04-24 NOTE — Assessment & Plan Note (Signed)
Pt uses zofran and imitrex for rescue  Refills given

## 2013-04-24 NOTE — Assessment & Plan Note (Signed)
Lab today Strong fam hx CAD Also adv to quit nicotine entirely

## 2013-04-24 NOTE — Assessment & Plan Note (Signed)
Suspect poss PCOS Glucose level today Disc the poss of insulin insensitivity - and adv to work on wt loss

## 2013-04-24 NOTE — Progress Notes (Signed)
Pre-visit discussion using our clinic review tool. No additional management support is needed unless otherwise documented below in the visit note.  

## 2013-04-24 NOTE — Assessment & Plan Note (Signed)
?   If poss PCOD- did change OC to Yasmin and will disc further at f/u

## 2013-04-24 NOTE — Assessment & Plan Note (Signed)
Will change OC to yasmin - to see if alt progestin helps  Also disc poss of PCOD  Will disc at f/u in Feb

## 2013-04-24 NOTE — Patient Instructions (Signed)
Finish this pill pack and placebo week  Then start the generic yasmin  Schedule physical and gyn appt end of Feb (any 30 min visit)  Work on [employer] and exercise  Stop the ecig and choose a non nicotine option   Labs today
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
## 2013-04-24 NOTE — Assessment & Plan Note (Addendum)
Lab today Expect from stress and lo est pill  Change to yasmin  Long discussion re: way to take OC properly and avoidance of smoking  Risks of blood clots outlined as well as possible side eff Pt aware that this does not prevent stds and condoms should still be used inst that it may take up to 3 months for menses to fall into rhythm or side eff to stop  Adv to call if problems or questions   F/u Feb

## 2013-04-24 NOTE — Assessment & Plan Note (Signed)
Stable on current meds Refilled  Would eventually like to get her off xanax

## 2013-04-25 ENCOUNTER — Encounter: Payer: Self-pay | Admitting: Family Medicine

## 2013-04-25 DIAGNOSIS — E785 Hyperlipidemia, unspecified: Secondary | ICD-10-CM | POA: Insufficient documentation

## 2013-04-25 LAB — CBC WITH DIFFERENTIAL/PLATELET
Basophils Absolute: 0.1 10*3/uL (ref 0.0–0.1)
Basophils Relative: 1 % (ref 0.0–3.0)
Eosinophils Absolute: 0.2 10*3/uL (ref 0.0–0.7)
Lymphocytes Relative: 23.4 % (ref 12.0–46.0)
MCHC: 33.8 g/dL (ref 30.0–36.0)
MCV: 84.5 fl (ref 78.0–100.0)
Monocytes Absolute: 0.5 10*3/uL (ref 0.1–1.0)
Neutrophils Relative %: 70.7 % (ref 43.0–77.0)
RBC: 4.62 Mil/uL (ref 3.87–5.11)
RDW: 12.8 % (ref 11.5–14.6)

## 2013-04-25 LAB — COMPREHENSIVE METABOLIC PANEL
ALT: 14 U/L (ref 0–35)
AST: 18 U/L (ref 0–37)
Albumin: 3.6 g/dL (ref 3.5–5.2)
Alkaline Phosphatase: 64 U/L (ref 39–117)
BUN: 9 mg/dL (ref 6–23)
Calcium: 8.9 mg/dL (ref 8.4–10.5)
Chloride: 105 mEq/L (ref 96–112)
Creatinine, Ser: 0.8 mg/dL (ref 0.4–1.2)
Potassium: 3.9 mEq/L (ref 3.5–5.1)

## 2013-04-25 LAB — LIPID PANEL
Cholesterol: 263 mg/dL — ABNORMAL HIGH (ref 0–200)
Total CHOL/HDL Ratio: 8
Triglycerides: 162 mg/dL — ABNORMAL HIGH (ref 0.0–149.0)
VLDL: 32.4 mg/dL (ref 0.0–40.0)

## 2013-04-25 LAB — HCG, SERUM, QUALITATIVE: Preg, Serum: NEGATIVE

## 2013-06-09 ENCOUNTER — Encounter: Payer: Self-pay | Admitting: Radiology

## 2013-06-12 ENCOUNTER — Encounter: Payer: Self-pay | Admitting: Family Medicine

## 2013-06-12 ENCOUNTER — Other Ambulatory Visit (HOSPITAL_COMMUNITY)
Admission: RE | Admit: 2013-06-12 | Discharge: 2013-06-12 | Disposition: A | Discharge status: Home / Self Care | Payer: BC Managed Care – PPO | Source: Ambulatory Visit | Attending: Family Medicine | Admitting: Family Medicine

## 2013-06-12 ENCOUNTER — Ambulatory Visit (INDEPENDENT_AMBULATORY_CARE_PROVIDER_SITE_OTHER): Payer: BC Managed Care – PPO | Admitting: Family Medicine

## 2013-06-12 VITALS — BP 122/82 | HR 95 | Temp 98.1°F | Ht 61.0 in | Wt 180.0 lb

## 2013-06-12 DIAGNOSIS — Z113 Encounter for screening for infections with a predominantly sexual mode of transmission: Secondary | ICD-10-CM | POA: Insufficient documentation

## 2013-06-12 DIAGNOSIS — E785 Hyperlipidemia, unspecified: Secondary | ICD-10-CM

## 2013-06-12 DIAGNOSIS — Z01419 Encounter for gynecological examination (general) (routine) without abnormal findings: Secondary | ICD-10-CM | POA: Insufficient documentation

## 2013-06-12 DIAGNOSIS — Z0000 Encounter for general adult medical examination without abnormal findings: Secondary | ICD-10-CM

## 2013-06-12 DIAGNOSIS — D72829 Elevated white blood cell count, unspecified: Secondary | ICD-10-CM

## 2013-06-12 LAB — CBC WITH DIFFERENTIAL/PLATELET
BASOS PCT: 0.2 % (ref 0.0–3.0)
Basophils Absolute: 0 10*3/uL (ref 0.0–0.1)
EOS PCT: 1.4 % (ref 0.0–5.0)
Eosinophils Absolute: 0.2 10*3/uL (ref 0.0–0.7)
HCT: 40.3 % (ref 36.0–46.0)
HEMOGLOBIN: 13.4 g/dL (ref 12.0–15.0)
LYMPHS PCT: 19.2 % (ref 12.0–46.0)
Lymphs Abs: 2.4 10*3/uL (ref 0.7–4.0)
MCHC: 33.3 g/dL (ref 30.0–36.0)
MCV: 87.1 fl (ref 78.0–100.0)
MONOS PCT: 6.5 % (ref 3.0–12.0)
Monocytes Absolute: 0.8 10*3/uL (ref 0.1–1.0)
NEUTROS ABS: 8.9 10*3/uL — AB (ref 1.4–7.7)
Neutrophils Relative %: 72.7 % (ref 43.0–77.0)
Platelets: 451 10*3/uL — ABNORMAL HIGH (ref 150.0–400.0)
RBC: 4.63 Mil/uL (ref 3.87–5.11)
RDW: 12.9 % (ref 11.5–14.6)
WBC: 12.3 10*3/uL — AB (ref 4.5–10.5)

## 2013-06-12 MED ORDER — ATORVASTATIN CALCIUM 20 MG PO TABS: 20.0000 mg | ORAL_TABLET | Freq: Every day | ORAL | Status: DC

## 2013-06-12 NOTE — Assessment & Plan Note (Signed)
Gc/ chlamydia and rpr/ hiv done today No symptoms  Disc std protection/prevention

## 2013-06-12 NOTE — Assessment & Plan Note (Signed)
Reviewed health habits including diet and exercise and skin cancer prevention Reviewed appropriate screening tests for age  Also reviewed health mt list, fam hx and immunization status , as well as social and family history   Labs reviewed  

## 2013-06-12 NOTE — Assessment & Plan Note (Signed)
Doing better with yasmin - will need several more mo to see if it helps with menses/ acne/ hirsuitism Pap done today Also STD screening

## 2013-06-12 NOTE — Assessment & Plan Note (Signed)
Lipids are very high Disc goals for lipids and reasons to control them Rev labs with pt Rev low sat fat diet in detail Strong fam hx of lipidemia and cad  Will tx with atorvastatin 20 mg daily Re check in 6 wk

## 2013-06-12 NOTE — Patient Instructions (Signed)
We will update you when labs return  Start atorvastatin (lipitor) 20 mg once daily - in evening  Avoid red meat/ fried foods/ egg yolks/ fatty breakfast meats/ butter, cheese and high fat dairy/ and shellfish   Schedule fasting labs in about 6 weeks   Fat and Cholesterol Control Diet Fat and cholesterol levels in your blood and organs are influenced by your diet. High levels of fat and cholesterol may lead to diseases of the heart, small and large blood vessels, gallbladder, liver, and pancreas. CONTROLLING FAT AND CHOLESTEROL WITH DIET Although exercise and lifestyle factors are important, your diet is key. That is because certain foods are known to raise cholesterol and others to lower it. The goal is to balance foods for their effect on cholesterol and more importantly, to replace saturated and trans fat with other types of fat, such as monounsaturated fat, polyunsaturated fat, and omega-3 fatty acids. On average, a person should consume no more than 15 to 17 g of saturated fat daily. Saturated and trans fats are considered "bad" fats, and they will raise LDL cholesterol. Saturated fats are primarily found in animal products such as meats, butter, and cream. However, that does not mean you need to give up all your favorite foods. Today, there are good tasting, low-fat, low-cholesterol substitutes for most of the things you like to eat. Choose low-fat or nonfat alternatives. Choose round or loin cuts of red meat. These types of cuts are lowest in fat and cholesterol. Chicken (without the skin), fish, veal, and ground [place] breast are great choices. Eliminate fatty meats, such as hot dogs and salami. Even shellfish have little or no saturated fat. Have a 3 oz (85 g) portion when you eat lean meat, poultry, or fish. Trans fats are also called "partially hydrogenated oils." They are oils that have been scientifically manipulated so that they are solid at room temperature resulting in a longer shelf life  and improved taste and texture of foods in which they are added. Trans fats are found in stick margarine, some tub margarines, cookies, crackers, and baked goods.  When baking and cooking, oils are a great substitute for butter. The monounsaturated oils are especially beneficial since it is believed they lower LDL and raise HDL. The oils you should avoid entirely are saturated tropical oils, such as coconut and palm.  Remember to eat a lot from food groups that are naturally free of saturated and trans fat, including fish, fruit, vegetables, beans, grains (barley, rice, couscous, bulgur wheat), and pasta (without cream sauces).  IDENTIFYING FOODS THAT LOWER FAT AND CHOLESTEROL  Soluble fiber may lower your cholesterol. This type of fiber is found in fruits such as apples, vegetables such as broccoli, potatoes, and carrots, legumes such as beans, peas, and lentils, and grains such as barley. Foods fortified with plant sterols (phytosterol) may also lower cholesterol. You should eat at least 2 g per day of these foods for a cholesterol lowering effect.  Read package labels to identify low-saturated fats, trans fat free, and low-fat foods at the supermarket. Select cheeses that have only 2 to 3 g saturated fat per ounce. Use a heart-healthy tub margarine that is free of trans fats or partially hydrogenated oil. When buying baked goods (cookies, crackers), avoid partially hydrogenated oils. Breads and muffins should be made from whole grains (whole-wheat or whole oat flour, instead of "flour" or "enriched flour"). Buy non-creamy canned soups with reduced salt and no added fats.  FOOD PREPARATION TECHNIQUES  Never deep-fry.  If you must fry, either stir-fry, which uses very little fat, or use non-stick cooking sprays. When possible, broil, bake, or roast meats, and steam vegetables. Instead of putting butter or margarine on vegetables, use lemon and herbs, applesauce, and cinnamon (for squash and sweet potatoes).  Use nonfat yogurt, salsa, and low-fat dressings for salads.  LOW-SATURATED FAT / LOW-FAT FOOD SUBSTITUTES Meats / Saturated Fat (g)  Avoid: Steak, marbled (3 oz/85 g) / 11 g  Choose: Steak, lean (3 oz/85 g) / 4 g  Avoid: Hamburger (3 oz/85 g) / 7 g  Choose: Hamburger, lean (3 oz/85 g) / 5 g  Avoid: Ham (3 oz/85 g) / 6 g  Choose: Ham, lean cut (3 oz/85 g) / 2.4 g  Avoid: Chicken, with skin, dark meat (3 oz/85 g) / 4 g  Choose: Chicken, skin removed, dark meat (3 oz/85 g) / 2 g  Avoid: Chicken, with skin, light meat (3 oz/85 g) / 2.5 g  Choose: Chicken, skin removed, light meat (3 oz/85 g) / 1 g Dairy / Saturated Fat (g)  Avoid: Whole milk (1 cup) / 5 g  Choose: Low-fat milk, 2% (1 cup) / 3 g  Choose: Low-fat milk, 1% (1 cup) / 1.5 g  Choose: Skim milk (1 cup) / 0.3 g  Avoid: Hard cheese (1 oz/28 g) / 6 g  Choose: Skim milk cheese (1 oz/28 g) / 2 to 3 g  Avoid: Cottage cheese, 4% fat (1 cup) / 6.5 g  Choose: Low-fat cottage cheese, 1% fat (1 cup) / 1.5 g  Avoid: Ice cream (1 cup) / 9 g  Choose: Sherbet (1 cup) / 2.5 g  Choose: Nonfat frozen yogurt (1 cup) / 0.3 g  Choose: Frozen fruit bar / trace  Avoid: Whipped cream (1 tbs) / 3.5 g  Choose: Nondairy whipped topping (1 tbs) / 1 g Condiments / Saturated Fat (g)  Avoid: Mayonnaise (1 tbs) / 2 g  Choose: Low-fat mayonnaise (1 tbs) / 1 g  Avoid: Butter (1 tbs) / 7 g  Choose: Extra light margarine (1 tbs) / 1 g  Avoid: Coconut oil (1 tbs) / 11.8 g  Choose: Olive oil (1 tbs) / 1.8 g  Choose: Corn oil (1 tbs) / 1.7 g  Choose: Safflower oil (1 tbs) / 1.2 g  Choose: Sunflower oil (1 tbs) / 1.4 g  Choose: Soybean oil (1 tbs) / 2.4 g  Choose: Canola oil (1 tbs) / 1 g Document Released: 04/20/2005 Document Revised: 08/15/2012 Document Reviewed: 10/09/2010 [employer] Patient Information 2014 [place], [place].

## 2013-06-12 NOTE — Progress Notes (Signed)
Pre-visit discussion using our clinic review tool. No additional management support is needed unless otherwise documented below in the visit note.  

## 2013-06-12 NOTE — Progress Notes (Signed)
Subjective:    Patient ID: Yvonne Trujillo, female    DOB: May 13, 1985, 28 y.o.   MRN: [id number]  HPI Here for health maintenance exam and to review chronic medical problems    Doing well overall  Went to [facility] on Friday- thought she had the flu - and had neg flu test and strep test  Still gave her tamiflu  - because she had classic symptoms    Wt is down 3 lb - she has been working on it - eating less and eating healthier  Next step will be exercise   Flu vaccine 9/14  Pap 2/14 - due today  Menses has had one on yasmin- lasted 6 days and pretty normal  On yasmin -no problems- likes it so far  Does want to get std tests today incl HIV   Wonders about poss PCOS -? And not ready to eval further with endo yet  Has had one ovarian cyst   hpv status - never had it  Has had the series of vaccines   Tdap 12/14   Last labs - wbc up  No symptoms  No urinary symptoms  Lab Results  Component Value Date   WBC 13.9* 04/24/2013   HGB 13.2 04/24/2013   HCT 39.0 04/24/2013   MCV 84.5 04/24/2013   PLT 430.0* 04/24/2013     Hyperlipidemia Lab Results  Component Value Date   CHOL 263* 04/24/2013   Lab Results  Component Value Date   HDL 34.50* 04/24/2013   No results found for this basename: [facility]   Lab Results  Component Value Date   TRIG 162.0* 04/24/2013   Lab Results  Component Value Date   CHOLHDL 8 04/24/2013   Lab Results  Component Value Date   LDLDIRECT 223.2 04/24/2013   no exercise  Has been eating better  Parents both have high cholesterol Mother CAD at 5934 - with bypass    Mood- has been good overall  On wellbutrin  Xanax - takes it occ -has not had it lately  Due for toxicology screen today  Patient Active Problem List   Diagnosis Date Noted  . Encounter for routine gynecological examination 06/12/2013  . Screen for STD (sexually transmitted disease) 06/12/2013  . Leukocytosis, unspecified 06/12/2013  . Hyperlipidemia 04/25/2013  . Amenorrhea  04/24/2013  . Migraine 04/24/2013  . Depression with anxiety 04/24/2013  . Hirsutism 04/24/2013  . Acne 04/24/2013  . History of kidney stones 04/24/2013  . Screening for lipoid disorders 04/24/2013  . Screening for diabetes mellitus 04/24/2013  . Routine general medical examination at a health care facility 04/24/2013   Past Medical History  Diagnosis Date  . Migraine   . Kidney stone   . Kidney infection   . Concussion   . History of depression    No past surgical history on file. History  Substance Use Topics  . Smoking status: Former Smoker -- 6 years    Types: Cigarettes  . Smokeless tobacco: Never Used  . Alcohol Use: Yes     Comment: socially   Family History  Problem Relation Age of Onset  . Alcohol abuse Maternal Uncle   . Hyperlipidemia Mother   . Hyperlipidemia Father   . Hypertension Father   . Hypertension Mother   . Heart disease Mother   . Diabetes Cousin    No Known Allergies Current Outpatient Prescriptions on File Prior to Visit  Medication Sig Dispense Refill  . ALPRAZolam (XANAX) 0.5 MG tablet Take 1  tablet (0.5 mg total) by mouth 2 (two) times daily as needed for sleep.  60 tablet  3  . buPROPion (WELLBUTRIN XL) 150 MG 24 hr tablet Take 1 tablet (150 mg total) by mouth daily.  90 tablet  3  . drospirenone-ethinyl estradiol (YASMIN,ZARAH,SYEDA) 3-0.03 MG tablet Take 1 tablet by mouth daily.  1 Package  11  . ondansetron (ZOFRAN) 4 MG tablet Take 1 tablet (4 mg total) by mouth every 8 (eight) hours as needed for nausea or vomiting.  90 tablet  3  . SUMAtriptan (IMITREX) 100 MG tablet Take 1 tablet (100 mg total) by mouth every 2 (two) hours as needed for migraine.  30 tablet  3  . valACYclovir (VALTREX) 1000 MG tablet Take 1,000 mg by mouth as needed.       No current facility-administered medications on file prior to visit.    Review of Systems Review of Systems  Constitutional: Negative for fever, appetite change, fatigue and unexpected weight  change.  Eyes: Negative for pain and visual disturbance.  Respiratory: Negative for cough and shortness of breath.   Cardiovascular: Negative for cp or palpitations    Gastrointestinal: Negative for nausea, diarrhea and constipation.  Genitourinary: Negative for urgency and frequency.  Skin: Negative for pallor or rash  pos for acne and facial hair growth  Neurological: Negative for weakness, light-headedness, numbness and headaches.  Hematological: Negative for adenopathy. Does not bruise/bleed easily.  Psychiatric/Behavioral: Negative for dysphoric mood. The patient is anxious at times        Objective:   Physical Exam  Constitutional: She appears well-developed and well-nourished. No distress.  obese and well appearing   HENT:  Head: Normocephalic and atraumatic.  Right Ear: External ear normal.  Left Ear: External ear normal.  Nose: Nose normal.  Mouth/Throat: Oropharynx is clear and moist.  Eyes: Conjunctivae and EOM are normal. Pupils are equal, round, and reactive to light. Right eye exhibits no discharge. Left eye exhibits no discharge. No scleral icterus.  Neck: Normal range of motion. Neck supple. No JVD present. No thyromegaly present.  Cardiovascular: Normal rate, regular rhythm, normal heart sounds and intact distal pulses.  Exam reveals no gallop.   Pulmonary/Chest: Effort normal and breath sounds normal. No respiratory distress. She has no wheezes. She has no rales.  Abdominal: Soft. Bowel sounds are normal. She exhibits no distension and no mass. There is no tenderness.  Genitourinary: No breast swelling, tenderness, discharge or bleeding. There is no rash, tenderness or lesion on the right labia. There is no rash, tenderness or lesion on the left labia. Uterus is not enlarged and not tender. Cervix exhibits no motion tenderness, no discharge and no friability. Right adnexum displays no mass, no tenderness and no fullness. Left adnexum displays no mass, no tenderness and  no fullness. No erythema, tenderness or bleeding around the vagina.  Breast exam: No mass, nodules, thickening, tenderness, bulging, retraction, inflamation, nipple discharge or skin changes noted.  No axillary or clavicular LA.  Chaperoned exam.    Musculoskeletal: She exhibits no edema and no tenderness.  Lymphadenopathy:    She has no cervical adenopathy.  Neurological: She is alert. She has normal reflexes. No cranial nerve deficit. She exhibits normal muscle tone. Coordination normal.  Skin: Skin is warm and dry. No rash noted. No erythema. No pallor.  Psychiatric: She has a normal mood and affect.          Assessment & Plan:

## 2013-06-12 NOTE — Assessment & Plan Note (Signed)
No symptoms of infection  Re check today

## 2013-06-13 LAB — RPR

## 2013-06-13 LAB — HIV ANTIBODY (ROUTINE TESTING W REFLEX): HIV: NONREACTIVE

## 2013-07-25 ENCOUNTER — Other Ambulatory Visit: Payer: BC Managed Care – PPO

## 2013-07-27 ENCOUNTER — Encounter: Payer: Self-pay | Admitting: Internal Medicine

## 2013-07-27 ENCOUNTER — Other Ambulatory Visit (INDEPENDENT_AMBULATORY_CARE_PROVIDER_SITE_OTHER): Payer: BC Managed Care – PPO

## 2013-07-27 ENCOUNTER — Ambulatory Visit (INDEPENDENT_AMBULATORY_CARE_PROVIDER_SITE_OTHER): Payer: BC Managed Care – PPO | Admitting: Internal Medicine

## 2013-07-27 VITALS — BP 114/70 | HR 99 | Temp 98.4°F | Ht 61.0 in | Wt 185.2 lb

## 2013-07-27 DIAGNOSIS — E785 Hyperlipidemia, unspecified: Secondary | ICD-10-CM

## 2013-07-27 DIAGNOSIS — N390 Urinary tract infection, site not specified: Secondary | ICD-10-CM

## 2013-07-27 DIAGNOSIS — IMO0001 Reserved for inherently not codable concepts without codable children: Secondary | ICD-10-CM

## 2013-07-27 DIAGNOSIS — R3915 Urgency of urination: Secondary | ICD-10-CM

## 2013-07-27 DIAGNOSIS — R350 Frequency of micturition: Secondary | ICD-10-CM

## 2013-07-27 DIAGNOSIS — R300 Dysuria: Secondary | ICD-10-CM

## 2013-07-27 LAB — LIPID PANEL
CHOLESTEROL: 182 mg/dL (ref 0–200)
HDL: 49.9 mg/dL (ref >39.00)
LDL Cholesterol: 96 mg/dL (ref 0–99)
TRIGLYCERIDES: 181 mg/dL — AB (ref 0.0–149.0)
Total CHOL/HDL Ratio: 4
VLDL: 36.2 mg/dL (ref 0.0–40.0)

## 2013-07-27 LAB — AST: AST: 13 U/L (ref 0–37)

## 2013-07-27 LAB — POCT URINALYSIS DIPSTICK
Bilirubin, UA: NEGATIVE
Glucose, UA: NEGATIVE
KETONES UA: NEGATIVE
Nitrite, UA: POSITIVE
PH UA: 7.5
Spec Grav, UA: 1.01
Urobilinogen, UA: 0.2

## 2013-07-27 LAB — ALT: ALT: 15 U/L (ref 0–35)

## 2013-07-27 MED ORDER — NITROFURANTOIN MONOHYD MACRO 100 MG PO CAPS: 100.0000 mg | ORAL_CAPSULE | Freq: Two times a day (BID) | ORAL | Status: DC

## 2013-07-27 NOTE — Progress Notes (Signed)
HPI  Pt presents to the clinic today with c/o urinary urgency, frequency and dysuria. She reports this started 2 days ago. She denies fever, chills, low back pain, nausea and vomiting. She has taken AZO OTC.   Review of Systems  Past Medical History  Diagnosis Date  . Migraine   . Kidney stone   . Kidney infection   . Concussion   . History of depression     Family History  Problem Relation Age of Onset  . Alcohol abuse Maternal Uncle   . Hyperlipidemia Mother   . Hyperlipidemia Father   . Hypertension Father   . Hypertension Mother   . Heart disease Mother   . Diabetes Cousin     History   Social History  . Marital Status: Single    Spouse Name: N/A    Number of Children: N/A  . Years of Education: N/A   Occupational History  . Not on file.   Social History Main Topics  . Smoking status: Former Smoker -- 6 years    Types: Cigarettes  . Smokeless tobacco: Never Used  . Alcohol Use: Yes     Comment: socially  . Drug Use: No  . Sexual Activity: Not on file   Other Topics Concern  . Not on file   Social History Narrative  . No narrative on file    No Known Allergies  Constitutional: Denies fever, malaise, fatigue, headache or abrupt weight changes.   GU: Pt reports urgency, frequency and pain with urination. Denies burning sensation, blood in urine, odor or discharge. Skin: Denies redness, rashes, lesions or ulcercations.   No other specific complaints in a complete review of systems (except as listed in HPI above).    Objective:   Physical Exam  BP 114/70  Pulse 99  Temp(Src) 98.4 F (36.9 C) (Oral)  Ht 5\' 1"  (1.549 m)  Wt 185 lb 4 oz (84.029 kg)  BMI 35.02 kg/m2  SpO2 97% Wt Readings from Last 3 Encounters:  07/27/13 185 lb 4 oz (84.029 kg)  06/12/13 180 lb (81.647 kg)  04/24/13 183 lb 8 oz (83.235 kg)    General: Appears her stated age, well developed, well nourished in NAD. Cardiovascular: Normal rate and rhythm. S1,S2 noted.  No  murmur, rubs or gallops noted. No JVD or BLE edema. No carotid bruits noted. Pulmonary/Chest: Normal effort and positive vesicular breath sounds. No respiratory distress. No wheezes, rales or ronchi noted.  Abdomen: Soft and nontender. Normal bowel sounds, no bruits noted. No distention or masses noted. Liver, spleen and kidneys non palpable. Tender to palpation over the bladder area. No CVA tenderness.      Assessment & Plan:   Urgency, Frequency, Dysuria secondary to UTI:  Urinalysis: large blood, mod leuks eRx sent if for Macrobid 100 mg BID x 5 days OK to take AZO OTC Drink plenty of fluids  RTC as needed or if symptoms persist.

## 2013-07-27 NOTE — Addendum Note (Signed)
Addended by: Patience MuscaISLEY, RENA M on: 07/27/2013 01:33 PM   Modules accepted: Orders

## 2013-07-27 NOTE — Patient Instructions (Addendum)
Urinary Tract Infection  Urinary tract infections (UTIs) can develop anywhere along your urinary tract. Your urinary tract is your body's drainage system for removing wastes and extra water. Your urinary tract includes two kidneys, two ureters, a bladder, and a urethra. Your kidneys are a pair of bean-shaped organs. Each kidney is about the size of your fist. They are located below your ribs, one on each side of your spine.  CAUSES  Infections are caused by microbes, which are microscopic organisms, including fungi, viruses, and bacteria. These organisms are so small that they can only be seen through a microscope. Bacteria are the microbes that most commonly cause UTIs.  SYMPTOMS   Symptoms of UTIs may vary by age and gender of the patient and by the location of the infection. Symptoms in young women typically include a frequent and intense urge to urinate and a painful, burning feeling in the bladder or urethra during urination. Older women and men are more likely to be tired, shaky, and weak and have muscle aches and abdominal pain. A fever may mean the infection is in your kidneys. Other symptoms of a kidney infection include pain in your back or sides below the ribs, nausea, and vomiting.  DIAGNOSIS  To diagnose a UTI, your caregiver will ask you about your symptoms. Your caregiver also will ask to provide a urine sample. The urine sample will be tested for bacteria and white blood cells. White blood cells are made by your body to help fight infection.  TREATMENT   Typically, UTIs can be treated with medication. Because most UTIs are caused by a bacterial infection, they usually can be treated with the use of antibiotics. The choice of antibiotic and length of treatment depend on your symptoms and the type of bacteria causing your infection.  HOME CARE INSTRUCTIONS   If you were prescribed antibiotics, take them exactly as your caregiver instructs you. Finish the medication even if you feel better after you  have only taken some of the medication.   Drink enough water and fluids to keep your urine clear or pale yellow.   Avoid caffeine, tea, and carbonated beverages. They tend to irritate your bladder.   Empty your bladder often. Avoid holding urine for long periods of time.   Empty your bladder before and after sexual intercourse.   After a bowel movement, women should cleanse from front to back. Use each tissue only once.  SEEK MEDICAL CARE IF:    You have back pain.   You develop a fever.   Your symptoms do not begin to resolve within 3 days.  SEEK IMMEDIATE MEDICAL CARE IF:    You have severe back pain or lower abdominal pain.   You develop chills.   You have nausea or vomiting.   You have continued burning or discomfort with urination.  MAKE SURE YOU:    Understand these instructions.   Will watch your condition.   Will get help right away if you are not doing well or get worse.  Document Released: 01/28/2005 Document Revised: 10/20/2011 Document Reviewed: 05/29/2011  [employer] Patient Information 2014 [employer].

## 2013-07-27 NOTE — Progress Notes (Signed)
Pre visit review using our clinic review tool, if applicable. No additional management support is needed unless otherwise documented below in the visit note. 

## 2013-07-28 ENCOUNTER — Encounter: Payer: Self-pay | Admitting: *Deleted

## 2013-09-27 ENCOUNTER — Other Ambulatory Visit: Payer: Self-pay | Admitting: Family Medicine

## 2013-09-27 NOTE — Telephone Encounter (Signed)
Rx called in as prescribed 

## 2013-09-27 NOTE — Telephone Encounter (Signed)
Electronic refill request, please advise  

## 2013-09-27 NOTE — Telephone Encounter (Signed)
Px written for call in   

## 2013-10-24 ENCOUNTER — Ambulatory Visit (INDEPENDENT_AMBULATORY_CARE_PROVIDER_SITE_OTHER): Payer: BC Managed Care – PPO | Admitting: Family Medicine

## 2013-10-24 ENCOUNTER — Encounter: Payer: Self-pay | Admitting: Family Medicine

## 2013-10-24 VITALS — BP 100/62 | HR 112 | Temp 98.3°F | Wt 183.2 lb

## 2013-10-24 DIAGNOSIS — Z113 Encounter for screening for infections with a predominantly sexual mode of transmission: Secondary | ICD-10-CM

## 2013-10-24 NOTE — Progress Notes (Signed)
Subjective:   Patient ID: Yvonne Trujillo, female    DOB: [birth year], 28 y.o.   MRN: [id number]  Yvonne Trujillo is a pleasant 28 y.o. year old female pt of Dr. Milinda Antisower who presents to clinic today for  STD testing  on 10/24/2013  HPI: Sexually active with new partner. Wants HIV testing only.  Not using condoms.  She is on OCPs.  Denies any vaginal burning, dysuria, abnormal vaginal discharge or bleeding. No genital sores.  Current Outpatient Prescriptions on File Prior to Visit  Medication Sig Dispense Refill  . ALPRAZolam (XANAX) 0.5 MG tablet Take 1 tablet (0.5 mg total) by mouth 2 (two) times daily as needed for anxiety or sleep.  60 tablet  3  . atorvastatin (LIPITOR) 20 MG tablet Take 1 tablet (20 mg total) by mouth daily.  30 tablet  11  . buPROPion (WELLBUTRIN XL) 150 MG 24 hr tablet Take 1 tablet (150 mg total) by mouth daily.  90 tablet  3  . drospirenone-ethinyl estradiol (YASMIN,ZARAH,SYEDA) 3-0.03 MG tablet Take 1 tablet by mouth daily.  1 Package  11  . ondansetron (ZOFRAN) 4 MG tablet Take 1 tablet (4 mg total) by mouth every 8 (eight) hours as needed for nausea or vomiting.  90 tablet  3  . SUMAtriptan (IMITREX) 100 MG tablet Take 1 tablet (100 mg total) by mouth every 2 (two) hours as needed for migraine.  30 tablet  3  . valACYclovir (VALTREX) 1000 MG tablet Take 1,000 mg by mouth as needed.       No current facility-administered medications on file prior to visit.    No Known Allergies  Past Medical History  Diagnosis Date  . Migraine   . Kidney stone   . Kidney infection   . Concussion   . History of depression     No past surgical history on file.  Family History  Problem Relation Age of Onset  . Alcohol abuse Maternal Uncle   . Hyperlipidemia Mother   . Hyperlipidemia Father   . Hypertension Father   . Hypertension Mother   . Heart disease Mother   . Diabetes Cousin     History   Social History  . Marital Status: Single    Spouse Name: N/A   Number of Children: N/A  . Years of Education: N/A   Occupational History  . Not on file.   Social History Main Topics  . Smoking status: Former Smoker -- 6 years    Types: Cigarettes  . Smokeless tobacco: Never Used  . Alcohol Use: Yes     Comment: socially  . Drug Use: No  . Sexual Activity: Not on file   Other Topics Concern  . Not on file   Social History Narrative  . No narrative on file   The PMH, PSH, Social History, Family History, Medications, and allergies [facility], and have been updated if relevant.   Review of Systems    See HPI  Objective:    BP 100/62  Pulse 112  Temp(Src) 98.3 F (36.8 C) (Oral)  Wt 183 lb 4 oz (83.122 kg)  SpO2 95%  LMP 10/03/2013   Physical Exam  Nursing note and vitals reviewed. Constitutional: She is oriented to person, place, and time. She appears well-developed and well-nourished. No distress.  HENT:  Head: Normocephalic.  Neurological: She is alert and oriented to person, place, and time. No cranial nerve deficit. Coordination normal.  Skin: Skin is warm and  dry.  Psychiatric: She has a normal mood and affect. Her behavior is normal. Judgment and thought content normal.          Assessment & Plan:   Screen for STD (sexually transmitted disease) - Plan: HIV antibody (with reflex) No Follow-up on file.

## 2013-10-24 NOTE — Patient Instructions (Signed)
Great to see you. Please start using condoms.  I will call you with your lab results.

## 2013-10-24 NOTE — Progress Notes (Signed)
Pre visit review using our clinic review tool, if applicable. No additional management support is needed unless otherwise documented below in the visit note. 

## 2013-10-24 NOTE — Assessment & Plan Note (Signed)
Discussed dangers of smoking, alcohol, and drug abuse.  Also discussed sexual activity, pregnancy risk, and STD risk.  Encouraged condom use and screening for other STDs as well. She wanted HIV testing only today. Order entered.

## 2013-10-25 LAB — HIV ANTIBODY (ROUTINE TESTING W REFLEX): HIV 1&2 Ab, 4th Generation: NONREACTIVE

## 2014-02-26 ENCOUNTER — Encounter (HOSPITAL_BASED_OUTPATIENT_CLINIC_OR_DEPARTMENT_OTHER): Payer: Self-pay | Admitting: Emergency Medicine

## 2014-02-26 ENCOUNTER — Emergency Department (HOSPITAL_BASED_OUTPATIENT_CLINIC_OR_DEPARTMENT_OTHER)
Admission: EM | Admit: 2014-02-26 | Discharge: 2014-02-26 | Disposition: A | Discharge status: Home / Self Care | Payer: BC Managed Care – PPO | Attending: Emergency Medicine | Admitting: Emergency Medicine

## 2014-02-26 DIAGNOSIS — Z79899 Other long term (current) drug therapy: Secondary | ICD-10-CM | POA: Insufficient documentation

## 2014-02-26 DIAGNOSIS — F329 Major depressive disorder, single episode, unspecified: Secondary | ICD-10-CM | POA: Diagnosis not present

## 2014-02-26 DIAGNOSIS — Z87448 Personal history of other diseases of urinary system: Secondary | ICD-10-CM | POA: Diagnosis not present

## 2014-02-26 DIAGNOSIS — G43009 Migraine without aura, not intractable, without status migrainosus: Secondary | ICD-10-CM

## 2014-02-26 DIAGNOSIS — Z87442 Personal history of urinary calculi: Secondary | ICD-10-CM | POA: Insufficient documentation

## 2014-02-26 DIAGNOSIS — Z87891 Personal history of nicotine dependence: Secondary | ICD-10-CM | POA: Insufficient documentation

## 2014-02-26 DIAGNOSIS — Z87828 Personal history of other (healed) physical injury and trauma: Secondary | ICD-10-CM | POA: Insufficient documentation

## 2014-02-26 DIAGNOSIS — G43909 Migraine, unspecified, not intractable, without status migrainosus: Secondary | ICD-10-CM | POA: Diagnosis not present

## 2014-02-26 MED ORDER — METOCLOPRAMIDE HCL 5 MG/ML IJ SOLN
10.0000 mg | Freq: Once | INTRAMUSCULAR | Status: AC
  Administered 2014-02-26: 10 mg via INTRAMUSCULAR
  Filled 2014-02-26: qty 2

## 2014-02-26 MED ORDER — DIPHENHYDRAMINE HCL 50 MG/ML IJ SOLN
25.0000 mg | Freq: Once | INTRAMUSCULAR | Status: AC
  Administered 2014-02-26: 25 mg via INTRAMUSCULAR
  Filled 2014-02-26: qty 1

## 2014-02-26 MED ORDER — KETOROLAC TROMETHAMINE 60 MG/2ML IM SOLN
60.0000 mg | Freq: Once | INTRAMUSCULAR | Status: AC
  Administered 2014-02-26: 60 mg via INTRAMUSCULAR
  Filled 2014-02-26: qty 2

## 2014-02-26 NOTE — ED Provider Notes (Signed)
CSN: [id number]     Arrival date & time 02/26/14  [id number] History   First MD Initiated Contact with Patient 02/26/14 0919     Chief Complaint  Patient presents with  . Migraine     (Consider location/radiation/quality/duration/timing/severity/associated sxs/prior Treatment) HPI Comments: Pt comes in today with migraine times 2 days. Pt states that she has history of similar symptoms she just doesn't seem to be able to stop this headache. She has tried imitrex, zofran and hydrocodone with short term relief. Headache is similar to previous migraines but she is just unable to get it to go away. No aura. Does have nausea. No vomiting, blurred vision, syncope, or fever. She state that she did have her wisdom teeth pulled last week  The history is provided by the patient. No language interpreter was used.    Past Medical History  Diagnosis Date  . Migraine   . Kidney stone   . Kidney infection   . Concussion   . History of depression    History reviewed. No pertinent past surgical history. Family History  Problem Relation Age of Onset  . Alcohol abuse Maternal Uncle   . Hyperlipidemia Mother   . Hyperlipidemia Father   . Hypertension Father   . Hypertension Mother   . Heart disease Mother   . Diabetes Cousin    History  Substance Use Topics  . Smoking status: Former Smoker -- 6 years    Types: Cigarettes  . Smokeless tobacco: Never Used  . Alcohol Use: Yes     Comment: socially   OB History   Grav Para Term Preterm Abortions TAB SAB Ect Mult Living                 Review of Systems  All other systems reviewed and are negative.     Allergies  Review of patient's allergies indicates no known allergies.  Home Medications   Prior to Admission medications   Medication Sig Start Date End Date Taking? Authorizing Provider  ALPRAZolam Prudy Feeler(XANAX) 0.5 MG tablet Take 1 tablet (0.5 mg total) by mouth 2 (two) times daily as needed for anxiety or sleep. 09/27/13  Yes Judy PimpleMarne A Tower,  MD  atorvastatin (LIPITOR) 20 MG tablet Take 1 tablet (20 mg total) by mouth daily. 06/12/13  Yes Judy PimpleMarne A Tower, MD  buPROPion (WELLBUTRIN XL) 150 MG 24 hr tablet Take 1 tablet (150 mg total) by mouth daily. 04/24/13  Yes Judy PimpleMarne A Tower, MD  drospirenone-ethinyl estradiol (YASMIN,ZARAH,SYEDA) 3-0.03 MG tablet Take 1 tablet by mouth daily. 04/24/13  Yes Judy PimpleMarne A Tower, MD  ondansetron (ZOFRAN) 4 MG tablet Take 1 tablet (4 mg total) by mouth every 8 (eight) hours as needed for nausea or vomiting. 04/24/13  Yes Judy PimpleMarne A Tower, MD  SUMAtriptan (IMITREX) 100 MG tablet Take 1 tablet (100 mg total) by mouth every 2 (two) hours as needed for migraine. 04/24/13  Yes Judy PimpleMarne A Tower, MD  valACYclovir (VALTREX) 1000 MG tablet Take 1,000 mg by mouth as needed.   Yes Historical Provider, MD   BP 118/62  Pulse 89  Temp(Src) 98.1 F (36.7 C) (Oral)  Resp 18  Ht 5\' 1"  (1.549 m)  Wt 180 lb (81.647 kg)  BMI 34.03 kg/m2  SpO2 100%  LMP 02/12/2014 Physical Exam  Nursing note and vitals reviewed. Constitutional: She is oriented to person, place, and time. She appears well-developed and well-nourished.  HENT:  Head: Normocephalic and atraumatic.  Eyes: Conjunctivae and EOM are normal. Pupils are equal,  round, and reactive to light.  Neck: Normal range of motion. Neck supple.  Cardiovascular: Normal rate and regular rhythm.   Pulmonary/Chest: Effort normal and breath sounds normal.  Musculoskeletal: Normal range of motion.  Neurological: She is alert and oriented to person, place, and time.  Skin: Skin is warm and dry.  Psychiatric: She has a normal mood and affect.    ED Course  Procedures (including critical care time) Labs Review Labs Reviewed - No data to display  Imaging Review No results found.   EKG Interpretation None      MDM   Final diagnoses:  Migraine without aura and without status migrainosus, not intractable    Pt is feeling better at this time and is ready to go home after  migraine cocktail. No septic and meningeal symptoms. Pt is okay to follow up as needed    Teressa LowerVrinda Glendene Wyer, NP 02/26/14 1103

## 2014-02-26 NOTE — ED Provider Notes (Signed)
Medical screening examination/treatment/procedure(s) were performed by non-physician practitioner and as supervising physician I was immediately available for consultation/collaboration.   EKG Interpretation None        Dorwin Fitzhenry, MD 02/26/14 1318 

## 2014-02-26 NOTE — Discharge Instructions (Signed)

## 2014-02-26 NOTE — ED Notes (Signed)
Patient states she has had a migraine headache for the last two days.  History of migraines.  Has taken Imitrex, hydrocodone, zofran, and ibuprofen with short term relief.

## 2014-02-26 NOTE — ED Notes (Signed)
PA at bedside.

## 2014-02-26 NOTE — ED Notes (Signed)
Note for work given- d/c home with ride

## 2014-02-27 ENCOUNTER — Emergency Department (HOSPITAL_COMMUNITY): Payer: BC Managed Care – PPO

## 2014-02-27 ENCOUNTER — Emergency Department (HOSPITAL_COMMUNITY)
Admission: EM | Admit: 2014-02-27 | Discharge: 2014-02-27 | Disposition: A | Discharge status: Home / Self Care | Payer: BC Managed Care – PPO | Attending: Emergency Medicine | Admitting: Emergency Medicine

## 2014-02-27 ENCOUNTER — Encounter (HOSPITAL_COMMUNITY): Payer: Self-pay | Admitting: Emergency Medicine

## 2014-02-27 DIAGNOSIS — R0981 Nasal congestion: Secondary | ICD-10-CM | POA: Diagnosis not present

## 2014-02-27 DIAGNOSIS — R0602 Shortness of breath: Secondary | ICD-10-CM | POA: Diagnosis not present

## 2014-02-27 DIAGNOSIS — M549 Dorsalgia, unspecified: Secondary | ICD-10-CM | POA: Diagnosis not present

## 2014-02-27 DIAGNOSIS — Z87448 Personal history of other diseases of urinary system: Secondary | ICD-10-CM | POA: Insufficient documentation

## 2014-02-27 DIAGNOSIS — Z8659 Personal history of other mental and behavioral disorders: Secondary | ICD-10-CM | POA: Diagnosis not present

## 2014-02-27 DIAGNOSIS — Z87891 Personal history of nicotine dependence: Secondary | ICD-10-CM | POA: Insufficient documentation

## 2014-02-27 DIAGNOSIS — R1011 Right upper quadrant pain: Secondary | ICD-10-CM | POA: Diagnosis not present

## 2014-02-27 DIAGNOSIS — R05 Cough: Secondary | ICD-10-CM | POA: Diagnosis not present

## 2014-02-27 DIAGNOSIS — K8020 Calculus of gallbladder without cholecystitis without obstruction: Secondary | ICD-10-CM | POA: Insufficient documentation

## 2014-02-27 DIAGNOSIS — Z87442 Personal history of urinary calculi: Secondary | ICD-10-CM | POA: Insufficient documentation

## 2014-02-27 DIAGNOSIS — G43909 Migraine, unspecified, not intractable, without status migrainosus: Secondary | ICD-10-CM | POA: Diagnosis not present

## 2014-02-27 DIAGNOSIS — Z79899 Other long term (current) drug therapy: Secondary | ICD-10-CM | POA: Insufficient documentation

## 2014-02-27 DIAGNOSIS — R079 Chest pain, unspecified: Secondary | ICD-10-CM | POA: Diagnosis not present

## 2014-02-27 DIAGNOSIS — Z87828 Personal history of other (healed) physical injury and trauma: Secondary | ICD-10-CM | POA: Diagnosis not present

## 2014-02-27 DIAGNOSIS — Z3202 Encounter for pregnancy test, result negative: Secondary | ICD-10-CM | POA: Diagnosis not present

## 2014-02-27 LAB — URINE MICROSCOPIC-ADD ON

## 2014-02-27 LAB — HEPATIC FUNCTION PANEL
ALK PHOS: 71 U/L (ref 39–117)
ALT: 16 U/L (ref 0–35)
AST: 15 U/L (ref 0–37)
Albumin: 2.9 g/dL — ABNORMAL LOW (ref 3.5–5.2)
TOTAL PROTEIN: 7.3 g/dL (ref 6.0–8.3)
Total Bilirubin: <0.2 mg/dL — ABNORMAL LOW (ref 0.3–1.2)

## 2014-02-27 LAB — URINALYSIS, ROUTINE W REFLEX MICROSCOPIC
Bilirubin Urine: NEGATIVE
Glucose, UA: NEGATIVE mg/dL
KETONES UR: NEGATIVE mg/dL
LEUKOCYTES UA: NEGATIVE
Nitrite: NEGATIVE
PH: 6 (ref 5.0–8.0)
PROTEIN: NEGATIVE mg/dL
Specific Gravity, Urine: 1.024 (ref 1.005–1.030)
Urobilinogen, UA: 0.2 mg/dL (ref 0.0–1.0)

## 2014-02-27 LAB — LIPASE, BLOOD: LIPASE: 24 U/L (ref 11–59)

## 2014-02-27 LAB — CBC
HEMATOCRIT: 36.7 % (ref 36.0–46.0)
HEMOGLOBIN: 12.5 g/dL (ref 12.0–15.0)
MCH: 28.9 pg (ref 26.0–34.0)
MCHC: 34.1 g/dL (ref 30.0–36.0)
MCV: 85 fL (ref 78.0–100.0)
Platelets: 426 10*3/uL — ABNORMAL HIGH (ref 150–400)
RBC: 4.32 MIL/uL (ref 3.87–5.11)
RDW: 12.1 % (ref 11.5–15.5)
WBC: 12.1 10*3/uL — AB (ref 4.0–10.5)

## 2014-02-27 LAB — RAPID URINE DRUG SCREEN, HOSP PERFORMED
AMPHETAMINES: NOT DETECTED
BENZODIAZEPINES: NOT DETECTED
Barbiturates: NOT DETECTED
COCAINE: NOT DETECTED
OPIATES: NOT DETECTED
TETRAHYDROCANNABINOL: NOT DETECTED

## 2014-02-27 LAB — BASIC METABOLIC PANEL
Anion gap: 15 (ref 5–15)
BUN: 10 mg/dL (ref 6–23)
CHLORIDE: 101 meq/L (ref 96–112)
CO2: 21 meq/L (ref 19–32)
CREATININE: 0.76 mg/dL (ref 0.50–1.10)
Calcium: 9.4 mg/dL (ref 8.4–10.5)
GFR calc Af Amer: >90 mL/min (ref >90)
GFR calc non Af Amer: >90 mL/min (ref >90)
GLUCOSE: 96 mg/dL (ref 70–99)
POTASSIUM: 3.9 meq/L (ref 3.7–5.3)
Sodium: 137 mEq/L (ref 137–147)

## 2014-02-27 LAB — I-STAT TROPONIN, ED
Troponin i, poc: 0 ng/mL (ref 0.00–0.08)
Troponin i, poc: 0 ng/mL (ref 0.00–0.08)

## 2014-02-27 LAB — D-DIMER, QUANTITATIVE: D-Dimer, Quant: 0.54 ug/mL-FEU — ABNORMAL HIGH (ref 0.00–0.48)

## 2014-02-27 LAB — POC URINE PREG, ED: PREG TEST UR: NEGATIVE

## 2014-02-27 MED ORDER — MORPHINE SULFATE 2 MG/ML IJ SOLN
2.0000 mg | Freq: Once | INTRAMUSCULAR | Status: AC
  Administered 2014-02-27: 2 mg via INTRAVENOUS
  Filled 2014-02-27 (×2): qty 1

## 2014-02-27 MED ORDER — GI COCKTAIL ~~LOC~~
30.0000 mL | Freq: Once | ORAL | Status: AC
  Administered 2014-02-27: 30 mL via ORAL
  Filled 2014-02-27: qty 30

## 2014-02-27 MED ORDER — OXYCODONE-ACETAMINOPHEN 5-325 MG PO TABS: 1.0000 | ORAL_TABLET | Freq: Three times a day (TID) | ORAL | Status: DC | PRN

## 2014-02-27 MED ORDER — ONDANSETRON 4 MG PO TBDP: ORAL_TABLET | ORAL | Status: DC

## 2014-02-27 MED ORDER — SODIUM CHLORIDE 0.9 % IV BOLUS (SEPSIS)
500.0000 mL | Freq: Once | INTRAVENOUS | Status: AC
  Administered 2014-02-27: 500 mL via INTRAVENOUS

## 2014-02-27 MED ORDER — MORPHINE SULFATE 2 MG/ML IJ SOLN
2.0000 mg | Freq: Once | INTRAMUSCULAR | Status: DC
  Filled 2014-02-27: qty 1

## 2014-02-27 MED ORDER — OMEPRAZOLE 20 MG PO CPDR
20.0000 mg | DELAYED_RELEASE_CAPSULE | Freq: Every day | ORAL | Status: DC
Start: 2014-02-27 — End: 2014-09-24

## 2014-02-27 MED ORDER — MORPHINE SULFATE 2 MG/ML IJ SOLN
2.0000 mg | Freq: Once | INTRAMUSCULAR | Status: AC
  Administered 2014-02-27: 2 mg via INTRAVENOUS

## 2014-02-27 MED ORDER — OXYCODONE-ACETAMINOPHEN 5-325 MG PO TABS
1.0000 | ORAL_TABLET | Freq: Once | ORAL | Status: AC
  Administered 2014-02-27: 1 via ORAL
  Filled 2014-02-27: qty 1

## 2014-02-27 MED ORDER — SODIUM CHLORIDE 0.9 % IV BOLUS (SEPSIS)
1000.0000 mL | Freq: Once | INTRAVENOUS | Status: AC
  Administered 2014-02-27: 1000 mL via INTRAVENOUS

## 2014-02-27 MED ORDER — ONDANSETRON 4 MG PO TBDP
4.0000 mg | ORAL_TABLET | Freq: Once | ORAL | Status: AC
  Administered 2014-02-27: 4 mg via ORAL
  Filled 2014-02-27: qty 1

## 2014-02-27 MED ORDER — IOHEXOL 350 MG/ML SOLN
80.0000 mL | Freq: Once | INTRAVENOUS | Status: AC | PRN
  Administered 2014-02-27: 80 mL via INTRAVENOUS

## 2014-02-27 NOTE — ED Provider Notes (Signed)
CSN: [id number]     Arrival date & time 02/27/14  0615 History   First MD Initiated Contact with Patient 02/27/14 0615     Chief Complaint  Patient presents with  . Chest Pain     (Consider location/radiation/quality/duration/timing/severity/associated sxs/prior Treatment) The history is provided by the patient. No language interpreter was used.  Yvonne Trujillo is a 28 y/o F with PMhx of migraines, depression, kidney stones presenting to the ED with chest pain that woke the patient up at 4:00AM this morning. Patient reported that the pain is localized to the center of her chest described as a sharp pain that radiates towards her back with associated shortness of breath. Stated that nothing makes the pain worse. Reported that she took Peptobismol and Tums this morning without relief. Stated that she has been having a cough for the past couple of days with a whitish-yellowish phlegm. Stated that she has also been experiencing nasal congestion. Stated that she has been having intermittent episodes of nausea. Reported that she does smoke cigarettes, but has stopped a couple of days ago. Stated that she is on birth control - Yasmin for one year. Denied eye complaints, ear complaints, fever, chills, neck pain, neck stiffness, vomiting, travels, history of blood clots, hemoptysis, abdominal pain. PCP Dr. Milinda Antis  Past Medical History  Diagnosis Date  . Migraine   . Kidney stone   . Kidney infection   . Concussion   . History of depression    History reviewed. No pertinent past surgical history. Family History  Problem Relation Age of Onset  . Alcohol abuse Maternal Uncle   . Hyperlipidemia Mother   . Hyperlipidemia Father   . Hypertension Father   . Hypertension Mother   . Heart disease Mother   . Diabetes Cousin    History  Substance Use Topics  . Smoking status: Former Smoker -- 6 years    Types: Cigarettes  . Smokeless tobacco: Never Used  . Alcohol Use: Yes     Comment: socially    OB History   Grav Para Term Preterm Abortions TAB SAB Ect Mult Living                 Review of Systems  Constitutional: Negative for fever and chills.  HENT: Positive for congestion. Negative for ear discharge, ear pain, sore throat and trouble swallowing.   Respiratory: Positive for cough and shortness of breath. Negative for chest tightness.   Cardiovascular: Positive for chest pain. Negative for leg swelling.  Gastrointestinal: Positive for nausea. Negative for vomiting.  Musculoskeletal: Positive for back pain. Negative for neck pain and neck stiffness.  Neurological: Negative for dizziness, weakness, numbness and headaches.      Allergies  Review of patient's allergies indicates no known allergies.  Home Medications   Prior to Admission medications   Medication Sig Start Date End Date Taking? Authorizing Provider  acetaminophen (TYLENOL) 500 MG tablet Take 500 mg by mouth every 6 (six) hours as needed for moderate pain.   Yes Historical Provider, MD  ALPRAZolam Prudy Feeler) 0.5 MG tablet Take 1 tablet (0.5 mg total) by mouth 2 (two) times daily as needed for anxiety or sleep. 09/27/13  Yes Judy Pimple, MD  drospirenone-ethinyl estradiol (YASMIN,ZARAH,SYEDA) 3-0.03 MG tablet Take 1 tablet by mouth daily. 04/24/13  Yes Judy Pimple, MD  ibuprofen (ADVIL,MOTRIN) 200 MG tablet Take 400 mg by mouth every 6 (six) hours as needed for moderate pain.   Yes Historical Provider, MD  ondansetron (  ZOFRAN) 4 MG tablet Take 1 tablet (4 mg total) by mouth every 8 (eight) hours as needed for nausea or vomiting. 04/24/13  Yes Judy Pimple, MD  SUMAtriptan (IMITREX) 100 MG tablet Take 1 tablet (100 mg total) by mouth every 2 (two) hours as needed for migraine. 04/24/13  Yes Judy Pimple, MD  omeprazole (PRILOSEC) 20 MG capsule Take 1 capsule (20 mg total) by mouth daily. 02/27/14   Jove Beyl, PA-C  ondansetron (ZOFRAN ODT) 4 MG disintegrating tablet 4mg  ODT q4 hours prn nausea/vomit  02/27/14   Kaion Tisdale, PA-C  oxyCODONE-acetaminophen (PERCOCET/ROXICET) 5-325 MG per tablet Take 1 tablet by mouth every 8 (eight) hours as needed for moderate pain or severe pain. 02/27/14   Yoon Barca, PA-C   BP 104/78  Pulse 66  Temp(Src) 98.6 F (37 C) (Oral)  Resp 12  Ht 5\' 1"  (1.549 m)  Wt 180 lb (81.647 kg)  BMI 34.03 kg/m2  SpO2 98%  LMP 02/12/2014 Physical Exam  Nursing note and vitals reviewed. Constitutional: She is oriented to person, place, and time. She appears well-developed and well-nourished. No distress.  HENT:  Head: Normocephalic and atraumatic.  Mouth/Throat: Oropharynx is clear and moist. No oropharyngeal exudate.  Eyes: Conjunctivae and EOM are normal. Pupils are equal, round, and reactive to light. Right eye exhibits no discharge. Left eye exhibits no discharge.  Neck: Normal range of motion. Neck supple. No tracheal deviation present.  Negative neck stiffness Negative nuchal rigidity  Negative cervical lymphadenopathy  Negative meningeal signs  Cardiovascular: Normal rate, regular rhythm and normal heart sounds.  Exam reveals no friction rub.   No murmur heard. Cap refill < 3 seconds Negative swelling or pitting edema noted to the lower extremities bilaterally   Pulmonary/Chest: Effort normal and breath sounds normal. No respiratory distress. She has no wheezes. She has no rales. She exhibits tenderness.    Patient is able to speak in full sentences without difficulty  Negative use of accessory muscles Negative stridor  Discomfort upon palpation to the chest wall - pleuritic - pain is reproducible upon palpation   Abdominal: Soft. Bowel sounds are normal. She exhibits no distension. There is tenderness. There is no rebound and no guarding.  Negative abdominal distention Bowel sounds normoactive in all 4 quadrants Abdomen soft upon palpation Discomfort upon palpation to right upper quadrant epigastric region Negative peritoneal signs   Musculoskeletal: Normal range of motion.  Full ROM to upper and lower extremities without difficulty noted, negative ataxia noted.  Lymphadenopathy:    She has no cervical adenopathy.  Neurological: She is alert and oriented to person, place, and time. No cranial nerve deficit. She exhibits normal muscle tone. Coordination normal.  Cranial nerves III-XII grossly intact Strength 5+/5+ to upper and lower extremities bilaterally with resistance applied, equal distribution noted Equal grip strength Patient follows commands well  Patient responds to questions appropriately  Skin: Skin is warm and dry. No rash noted. She is not diaphoretic. No erythema.  Psychiatric: She has a normal mood and affect. Her behavior is normal. Thought content normal.    ED Course  Procedures (including critical care time)  Results for orders placed during the hospital encounter of 02/27/14  CBC      Result Value Ref Range   WBC 12.1 (*) 4.0 - 10.5 K/uL   RBC 4.32  3.87 - 5.11 MIL/uL   Hemoglobin 12.5  12.0 - 15.0 g/dL   HCT 16.1  09.6 - 04.5 %   MCV 85.0  78.0 - 100.0 fL   MCH 28.9  26.0 - 34.0 pg   MCHC 34.1  30.0 - 36.0 g/dL   RDW 40.9  81.1 - 91.4 %   Platelets 426 (*) 150 - 400 K/uL  BASIC METABOLIC PANEL      Result Value Ref Range   Sodium 137  137 - 147 mEq/L   Potassium 3.9  3.7 - 5.3 mEq/L   Chloride 101  96 - 112 mEq/L   CO2 21  19 - 32 mEq/L   Glucose, Bld 96  70 - 99 mg/dL   BUN 10  6 - 23 mg/dL   Creatinine, Ser 7.82  0.50 - 1.10 mg/dL   Calcium 9.4  8.4 - 95.6 mg/dL   GFR calc non Af Amer >90  >90 mL/min   GFR calc Af Amer >90  >90 mL/min   Anion gap 15  5 - 15  URINE RAPID DRUG SCREEN (HOSP PERFORMED)      Result Value Ref Range   Opiates NONE DETECTED  NONE DETECTED   Cocaine NONE DETECTED  NONE DETECTED   Benzodiazepines NONE DETECTED  NONE DETECTED   Amphetamines NONE DETECTED  NONE DETECTED   Tetrahydrocannabinol NONE DETECTED  NONE DETECTED   Barbiturates NONE DETECTED   NONE DETECTED  D-DIMER, QUANTITATIVE      Result Value Ref Range   D-Dimer, Quant 0.54 (*) 0.00 - 0.48 ug/mL-FEU  URINALYSIS, ROUTINE W REFLEX MICROSCOPIC      Result Value Ref Range   Color, Urine YELLOW  YELLOW   APPearance HAZY (*) CLEAR   Specific Gravity, Urine 1.024  1.005 - 1.030   pH 6.0  5.0 - 8.0   Glucose, UA NEGATIVE  NEGATIVE mg/dL   Hgb urine dipstick TRACE (*) NEGATIVE   Bilirubin Urine NEGATIVE  NEGATIVE   Ketones, ur NEGATIVE  NEGATIVE mg/dL   Protein, ur NEGATIVE  NEGATIVE mg/dL   Urobilinogen, UA 0.2  0.0 - 1.0 mg/dL   Nitrite NEGATIVE  NEGATIVE   Leukocytes, UA NEGATIVE  NEGATIVE  LIPASE, BLOOD      Result Value Ref Range   Lipase 24  11 - 59 U/L  URINE MICROSCOPIC-ADD ON      Result Value Ref Range   Squamous Epithelial / LPF FEW (*) RARE   WBC, UA 0-2  <3 WBC/hpf   RBC / HPF 0-2  <3 RBC/hpf   Bacteria, UA FEW (*) RARE   Crystals CA OXALATE CRYSTALS (*) NEGATIVE   Urine-Other MUCOUS PRESENT    HEPATIC FUNCTION PANEL      Result Value Ref Range   Total Protein 7.3  6.0 - 8.3 g/dL   Albumin 2.9 (*) 3.5 - 5.2 g/dL   AST 15  0 - 37 U/L   ALT 16  0 - 35 U/L   Alkaline Phosphatase 71  39 - 117 U/L   Total Bilirubin <0.2 (*) 0.3 - 1.2 mg/dL   Bilirubin, Direct <2.1  0.0 - 0.3 mg/dL   Indirect Bilirubin NOT CALCULATED  0.3 - 0.9 mg/dL  POC URINE PREG, ED      Result Value Ref Range   Preg Test, Ur NEGATIVE  NEGATIVE  I-STAT TROPOININ, ED      Result Value Ref Range   Troponin i, poc 0.00  0.00 - 0.08 ng/mL   Comment 3           I-STAT TROPOININ, ED      Result Value Ref Range   Troponin  i, poc 0.00  0.00 - 0.08 ng/mL   Comment 3             Labs Review Labs Reviewed  CBC - Abnormal; Notable for the following:    WBC 12.1 (*)    Platelets 426 (*)    All other components within normal limits  D-DIMER, QUANTITATIVE - Abnormal; Notable for the following:    D-Dimer, Quant 0.54 (*)    All other components within normal limits  URINALYSIS, ROUTINE W  REFLEX MICROSCOPIC - Abnormal; Notable for the following:    APPearance HAZY (*)    Hgb urine dipstick TRACE (*)    All other components within normal limits  URINE MICROSCOPIC-ADD ON - Abnormal; Notable for the following:    Squamous Epithelial / LPF FEW (*)    Bacteria, UA FEW (*)    Crystals CA OXALATE CRYSTALS (*)    All other components within normal limits  HEPATIC FUNCTION PANEL - Abnormal; Notable for the following:    Albumin 2.9 (*)    Total Bilirubin <0.2 (*)    All other components within normal limits  BASIC METABOLIC PANEL  URINE RAPID DRUG SCREEN (HOSP PERFORMED)  LIPASE, BLOOD  POC URINE PREG, ED  I-STAT TROPOININ, ED  I-STAT TROPOININ, ED    Imaging Review Dg Chest 2 View  02/27/2014   CLINICAL DATA:  Right upper chest pain radiating into upper back region; cough  EXAM: CHEST  2 VIEW  COMPARISON:  None.  FINDINGS: Lungs are clear. Heart size and pulmonary vascularity are normal. No pneumothorax. No adenopathy. No bone lesions.  IMPRESSION: No abnormality noted.   Electronically Signed   By: Bretta Bang M.D.   On: 02/27/2014 07:17   Ct Angio Chest Pe W/cm &/or Wo Cm  02/27/2014   CLINICAL DATA:  Chest pain.  EXAM: CT ANGIOGRAPHY CHEST WITH CONTRAST  TECHNIQUE: Multidetector CT imaging of the chest was performed using the standard protocol during bolus administration of intravenous contrast. Multiplanar CT image reconstructions and MIPs were obtained to evaluate the vascular anatomy.  CONTRAST:  80mL OMNIPAQUE IOHEXOL 350 MG/ML SOLN  COMPARISON:  Chest radiograph same day.  FINDINGS: No pneumothorax or pleural effusion is noted. No significant pulmonary parenchymal abnormality is noted. There is no evidence of pulmonary embolus. Thoracic aorta appears normal without evidence of aneurysm or dissection. There is no evidence of mediastinal mass or adenopathy. Visualized portion of upper abdomen appears normal. No osseous abnormality is noted.  Review of the MIP images  confirms the above findings.  IMPRESSION: No evidence of pulmonary embolus. No significant abnormality seen in the chest.   Electronically Signed   By: Roque Lias M.D.   On: 02/27/2014 08:28   US Abdomen Limited Ruq  02/27/2014   CLINICAL DATA:  RIGHT upper quadrant pain, history smoking, personal history of kidney stones  EXAM: US ABDOMEN LIMITED - RIGHT UPPER QUADRANT  COMPARISON:  None  FINDINGS: Gallbladder:  Normally distended. Upper normal wall thickness. Small amount of sludge within gallbladder. Single small non shadowing mobile echogenic focus within gallbladder lumen, question additional sludge versus tiny non shadowing calculus. Generalized RIGHT upper quadrant tenderness with transducer pressure without focal sonographic Murphy sign.  Common bile duct:  Diameter: 3 mm diameter, normal  Liver:  Normal echogenicity. Incomplete intrahepatic visualization cranially in RIGHT lobe due to shadowing from gas at lung bases. No definite focal hepatic mass or nodularity. Hepatopetal portal venous flow.  No RIGHT upper quadrant free fluid.  IMPRESSION:  Sludge and question single tiny nonobstructing calculus within gallbladder.  Otherwise negative exam.   Electronically Signed   By: Ulyses SouthwardMark  Boles M.D.   On: 02/27/2014 09:06     EKG Interpretation   Date/Time:  Tuesday February 27 2014 06:19:16 EDT Ventricular Rate:  85 PR Interval:  156 QRS Duration: 85 QT Interval:  358 QTC Calculation: 426 R Axis:   41 Text Interpretation:  Age not entered, assumed to be  28 years old for  purpose of ECG interpretation Sinus rhythm Confirmed by Wilkie AyeHORTON  MD,  COURTNEY ([id number]) on 02/27/2014 6:24:47 AM     10:37 AM This provider re-assessed the patient. Patient reported that pain has improved, but continues to have sharp pain radiating to her back.   10:52 AM Dr. Kirtland BouchardK. Powless at bedside assessing patient.   MDM   Final diagnoses:  Chest pain  RUQ pain  Gallstones without obstruction of gallbladder     Medications  gi cocktail (Maalox,Lidocaine,Donnatal) (not administered)  sodium chloride 0.9 % bolus 500 mL (0 mLs Intravenous Stopped 02/27/14 0736)  morphine 2 MG/ML injection 2 mg (2 mg Intravenous Given 02/27/14 0738)  ondansetron (ZOFRAN-ODT) disintegrating tablet 4 mg (4 mg Oral Given 02/27/14 0738)  iohexol (OMNIPAQUE) 350 MG/ML injection 80 mL (80 mLs Intravenous Contrast Given 02/27/14 0759)  morphine 2 MG/ML injection 2 mg (2 mg Intravenous Given 02/27/14 0918)  sodium chloride 0.9 % bolus 1,000 mL (1,000 mLs Intravenous New Bag/Given 02/27/14 0938)  oxyCODONE-acetaminophen (PERCOCET/ROXICET) 5-325 MG per tablet 1 tablet (1 tablet Oral Given 02/27/14 1100)   Filed Vitals:   02/27/14 1000 02/27/14 1030 02/27/14 1100 02/27/14 1130  BP: 116/69 106/70 107/81 104/78  Pulse: 64 71 89 66  Temp:      TempSrc:      Resp: 18 14 18 12   Height:      Weight:      SpO2: 100% 98% 98% 98%    EKG noted normal sinus rhythm with a heart rate of 85 bpm. i-STAT troponin negative elevation. Second i-STAT troponin negative elevation. CBC noted a mildly elevated white blood cell count 12.1. BMP unremarkable - negative anion gap. D-dimer elevated at 0.54. Lipase negative elevation. Hepatic function panel unremarkable-negative elevated liver enzymes or bilirubin. UA negative for infections - negative nitrites or leukocytes. Urine drug screen. Ketones not indicated. Urine pregnancy negative. Chest xray negative findings. CT angiogram of the chest no evidence of pulmonary embolism-no significant abnormality seen. Right upper quadrant ultrasound identified sludge and questionable tiny nonobstructing calculus within the gallbladder. Negative findings of PE or dissection. Doubt acute appendicitis. Doubt acute pancreatitis. Gallbladder sludge with a nonobstructing calculus noted on ultrasound--negative elevated bilirubin or leukocytosis noted-negative findings of cholecystitis or cholangitis. Doubt ACS based  on age and decreased risk factors. Doubt pneumonia. Suspicion of chest pain with radiation towards back referred from gallbladder. IV fluids and pain medications administered in the descending with relief. Patient able to tolerate fluids by mouth without difficulty. Patient seen and assessed by attending physician, Dr. Elesa MassedWard who agrees to plan of discharge recommends patient to be followed up as outpatient with surgery. Patient stable, afebrile. Patient not septic appearing. Discussed with patient proper diet. Discussed with patient to increase hydration. Refer to PCP and general surgery. Discussed with patient to closely monitor symptoms and if symptoms are to worsen or change to report back to the ED - strict return instructions given.  Patient agreed to plan of care, understood, all questions answered.   Yvonne MuttonMarissa Miamarie Moll, PA-C 02/27/14  [address]1142  Lititia Sen, PA-C 02/27/14 1155

## 2014-02-27 NOTE — ED Notes (Signed)
Patient transported to Ultrasound 

## 2014-02-27 NOTE — ED Notes (Signed)
Patient given oral fluids to drink, patient is tolerating oral medication and water.

## 2014-02-27 NOTE — Discharge Instructions (Signed)
Please call your doctor for a followup appointment within 24-48 hours. When you talk to your doctor please let them know that you were seen in the emergency department and have them acquire all of your records so that they can discuss the findings with you and formulate a treatment plan to fully care for your new and ongoing problems. Please call and set an appointment with your primary care provider to be seen and reassessed by the end of this week Please call and set-up an appointment with [facility] to be monitored regarding gallstones in the gallbladder Please rest to stay hydrated Please increase intake of water. Please decrease intake of fatty greasy foods, spicy foods, foods high in cholesterol Please avoid alcohol. Please take medications as prescribed - while on pain medications there is to be no drinking alcohol, driving, operating any heavy machinery. If extra please dispose in a proper manner. Please do not take any extra Tylenol with this medication for this can lead to Tylenol overdose and liver issues.  Please continue to monitor symptoms closely and if symptoms are to worsen or change (fever greater than 101, chills, sweating, nausea, vomiting, chest pain, shortness of breathe, difficulty breathing, weakness, numbness, tingling, worsening or changes to pain pattern, inability keep food or fluids down, blood in the stools, black tarry stools) please report back to the Emergency Department immediately.   Cholelithiasis Cholelithiasis (also called gallstones) is a form of gallbladder disease in which gallstones form in your gallbladder. The gallbladder is an organ that stores bile made in the liver, which helps digest fats. Gallstones begin as small crystals and slowly grow into stones. Gallstone pain occurs when the gallbladder spasms and a gallstone is blocking the duct. Pain can also occur when a stone passes out of the duct.  RISK FACTORS  Being female.   Having  multiple pregnancies. Health care providers sometimes advise removing diseased gallbladders before future pregnancies.   Being obese.  Eating a diet heavy in fried foods and fat.   Being older than 60 years and increasing age.   Prolonged use of medicines containing female hormones.   Having diabetes mellitus.   Rapidly losing weight.   Having a family history of gallstones (heredity).  SYMPTOMS  Nausea.   Vomiting.  Abdominal pain.   Yellowing of the skin (jaundice).   Sudden pain. It may persist from several minutes to several hours.  Fever.   Tenderness to the touch. In some cases, when gallstones do not move into the bile duct, people have no pain or symptoms. These are called "silent" gallstones.  TREATMENT Silent gallstones do not need treatment. In severe cases, emergency surgery may be required. Options for treatment include:  Surgery to remove the gallbladder. This is the most common treatment.  Medicines. These do not always work and may take 6-12 months or more to work.  Shock wave treatment (extracorporeal biliary lithotripsy). In this treatment an ultrasound machine sends shock waves to the gallbladder to break gallstones into smaller pieces that can pass into the intestines or be dissolved by medicine. HOME CARE INSTRUCTIONS   Only take over-the-counter or prescription medicines for pain, discomfort, or fever as directed by your health care provider.   Follow a low-fat diet until seen again by your health care provider. Fat causes the gallbladder to contract, which can result in pain.   Follow up with your health care provider as directed. Attacks are almost always recurrent and surgery is usually required for permanent treatment.  SEEK IMMEDIATE MEDICAL CARE IF:   Your pain increases and is not controlled by medicines.   You have a fever or persistent symptoms for more than 2-3 days.   You have a fever and your symptoms suddenly get  worse.   You have persistent nausea and vomiting.  MAKE SURE YOU:   Understand these instructions.  Will watch your condition.  Will get help right away if you are not doing well or get worse. Document Released: 04/16/2005 Document Revised: 12/21/2012 Document Reviewed: 10/12/2012 [facility] Patient Information 2015 [employer] [place]. This information is not intended to replace advice given to you by your health care provider. Make sure you discuss any questions you have with your health care provider.

## 2014-02-27 NOTE — ED Notes (Signed)
Pt has had 4 tums and 2 peptobismol pills.

## 2014-02-27 NOTE — ED Notes (Signed)
Dr. Elesa MassedWard at bedside assessing patient.  Family at bedside.

## 2014-02-27 NOTE — ED Notes (Signed)
Pt reports sharp central cp which radiates to her back. Pt also reports SOB and nausea. NAD at this time. Pt alert x 4.

## 2014-02-27 NOTE — ED Provider Notes (Signed)
Medical screening examination/treatment/procedure(s) were conducted as a shared visit with non-physician practitioner(s) and myself.  I personally evaluated the patient during the encounter.   EKG Interpretation   Date/Time:  Tuesday February 27 2014 06:19:16 EDT Ventricular Rate:  85 PR Interval:  156 QRS Duration: 85 QT Interval:  358 QTC Calculation: 426 R Axis:   41 Text Interpretation:  Age not entered, assumed to be  28 years old for  purpose of ECG interpretation Sinus rhythm Confirmed by HORTON  MD,  COURTNEY ([id number]) on 02/27/2014 6:24:47 AM      Pt is a 28 y.o. F with history of anxiety, migraines, kidney stones who presents emergency department with chest pain that radiates into her back. Patient is well-appearing, nontoxic, hemodynamically stable. Her lung sounds are normal. Abdomen is slightly tender to palpation in epigastric region and right upper quadrant with a negative Murphy sign. She is tender to palpation over her chest wall and back without step-off or deformity or lesions noted on the skin. Workup is remarkable for cholelithiasis with sludge but no signs of cholecystitis. LFTs, lipase are normal. She has a chronic leukocytosis which is unchanged. CT of her chest shows no dissection or pulmonary embolus. EKG shows no ischemic changes. I feel patient is safe to be discharged home with pain medication for her cholelithiasis without outpatient surgery follow-up information. I feel some of her pain is likely musculoskeletal in nature.  Layla MawKristen N Spoerl, DO 02/27/14 1118

## 2014-02-27 NOTE — ED Notes (Signed)
Pt returned from U/S, amb to the bathroom.

## 2014-02-28 ENCOUNTER — Telehealth: Payer: Self-pay | Admitting: Family Medicine

## 2014-02-28 NOTE — Telephone Encounter (Signed)
If her pain has calmed down - mid November should be fine -but keep me posted if things change or symptoms worsen Avoid all fatty foods most importantly

## 2014-02-28 NOTE — Telephone Encounter (Signed)
Pt called starting she went to [facility] yesterday with chest pain.  They treated her and released her she stated she had a gallstone but it wasn't blocking anything  They gave her the number [facility]  The couldn't get her in until mid November.  She wanted to know what she needed to do Please advise

## 2014-02-28 NOTE — Telephone Encounter (Addendum)
Pt notified of Dr. Tower's comments/recommendations and verbalized understanding  

## 2014-04-23 ENCOUNTER — Other Ambulatory Visit: Payer: Self-pay | Admitting: Family Medicine

## 2014-04-23 NOTE — Telephone Encounter (Signed)
Electronic refill request, please advise  

## 2014-04-23 NOTE — Telephone Encounter (Signed)
Px written for call in   

## 2014-04-24 NOTE — Telephone Encounter (Signed)
Rx called in as prescribed 

## 2014-05-23 ENCOUNTER — Other Ambulatory Visit: Payer: Self-pay | Admitting: Family Medicine

## 2014-05-23 NOTE — Telephone Encounter (Signed)
Please schedule annual exam spring or early summer (any 30 min) and refill until then  Thanks

## 2014-05-23 NOTE — Telephone Encounter (Signed)
Electronic refill request, no recent/future appt., please advise  

## 2014-05-24 NOTE — Telephone Encounter (Signed)
Called pt and # was off, letter mailed requesting pt to call office

## 2014-05-30 ENCOUNTER — Telehealth: Payer: Self-pay | Admitting: Family Medicine

## 2014-05-30 MED ORDER — DROSPIRENONE-ETHINYL ESTRADIOL 3-0.03 MG PO TABS
1.0000 | ORAL_TABLET | Freq: Every day | ORAL | Status: DC
Start: 2014-05-30 — End: 2015-02-18

## 2014-05-30 NOTE — Telephone Encounter (Signed)
Ok to refill? Next cpe appt on 12/12/2014.

## 2014-05-30 NOTE — Telephone Encounter (Signed)
Sent in Rx to pharmacy until Sept 2016.

## 2014-05-30 NOTE — Telephone Encounter (Signed)
Pt walked in today needed to get her birth control refilled [facility] Pt is complete out Appointment for cpx  Made 12/12/14 Please advise pt when called in

## 2014-05-30 NOTE — Telephone Encounter (Signed)
Please refill through sept 2016, thanks

## 2014-06-15 ENCOUNTER — Encounter (HOSPITAL_COMMUNITY): Payer: Self-pay

## 2014-06-15 ENCOUNTER — Emergency Department (HOSPITAL_COMMUNITY)
Admission: EM | Admit: 2014-06-15 | Discharge: 2014-06-15 | Disposition: A | Discharge status: Home / Self Care | Payer: BLUE CROSS/BLUE SHIELD | Attending: Emergency Medicine | Admitting: Emergency Medicine

## 2014-06-15 DIAGNOSIS — Z79899 Other long term (current) drug therapy: Secondary | ICD-10-CM | POA: Insufficient documentation

## 2014-06-15 DIAGNOSIS — Z87442 Personal history of urinary calculi: Secondary | ICD-10-CM | POA: Diagnosis not present

## 2014-06-15 DIAGNOSIS — Z87820 Personal history of traumatic brain injury: Secondary | ICD-10-CM | POA: Insufficient documentation

## 2014-06-15 DIAGNOSIS — Z87891 Personal history of nicotine dependence: Secondary | ICD-10-CM | POA: Insufficient documentation

## 2014-06-15 DIAGNOSIS — G43009 Migraine without aura, not intractable, without status migrainosus: Secondary | ICD-10-CM | POA: Diagnosis not present

## 2014-06-15 DIAGNOSIS — Z87448 Personal history of other diseases of urinary system: Secondary | ICD-10-CM | POA: Insufficient documentation

## 2014-06-15 DIAGNOSIS — F329 Major depressive disorder, single episode, unspecified: Secondary | ICD-10-CM | POA: Insufficient documentation

## 2014-06-15 DIAGNOSIS — G43909 Migraine, unspecified, not intractable, without status migrainosus: Secondary | ICD-10-CM | POA: Diagnosis present

## 2014-06-15 MED ORDER — SODIUM CHLORIDE 0.9 % IV BOLUS (SEPSIS)
1000.0000 mL | Freq: Once | INTRAVENOUS | Status: AC
  Administered 2014-06-15: 1000 mL via INTRAVENOUS

## 2014-06-15 MED ORDER — ONDANSETRON HCL 4 MG/2ML IJ SOLN
4.0000 mg | Freq: Once | INTRAMUSCULAR | Status: AC
  Administered 2014-06-15: 4 mg via INTRAVENOUS
  Filled 2014-06-15: qty 2

## 2014-06-15 MED ORDER — METOCLOPRAMIDE HCL 5 MG/ML IJ SOLN
10.0000 mg | Freq: Once | INTRAMUSCULAR | Status: AC
  Administered 2014-06-15: 10 mg via INTRAVENOUS
  Filled 2014-06-15: qty 2

## 2014-06-15 MED ORDER — DIPHENHYDRAMINE HCL 50 MG/ML IJ SOLN
25.0000 mg | Freq: Once | INTRAMUSCULAR | Status: AC
  Administered 2014-06-15: 25 mg via INTRAVENOUS
  Filled 2014-06-15: qty 1

## 2014-06-15 NOTE — ED Notes (Signed)
Pt states she has had a migraine X3 days, has history of. Takes imbitrex at home and headache went away but returned. Pt reports nausea and blurred vision as well.

## 2014-06-15 NOTE — Discharge Instructions (Signed)

## 2014-06-15 NOTE — ED Provider Notes (Signed)
CSN: [id number]     Arrival date & time 06/15/14  4098 History  This chart was scribed for Glynn Octave, MD by Leone Payor, ED Scribe. This patient was seen in room B19C/B19C and the patient's care was started 8:45 AM.    Chief Complaint  Patient presents with  . Migraine   The history is provided by the patient. No language interpreter was used.     HPI Comments: Yvonne Trujillo is a 29 y.o. female with past medical history of migraines who presents to the Emergency Department complaining of 3 days of a gradual onset, gradually worsened, constant frontal migraine headache. She reports associated photophobia, phonophobia, nausea, blurry vision, fatigue. She reports a history of similar symptoms with prior migraines which are usually relieved by Imitrex. She tried taking Imitrex and Zofran at home with relief but states the HA returned the following morning. She states she has received migraine cocktails in the past with relief. She denies vomiting, extremity weakness or numbness, CP, abdominal pain. LMP was 2 weeks ago.   Past Medical History  Diagnosis Date  . Migraine   . Kidney stone   . Kidney infection   . Concussion   . History of depression    History reviewed. No pertinent past surgical history. Family History  Problem Relation Age of Onset  . Alcohol abuse Maternal Uncle   . Hyperlipidemia Mother   . Hyperlipidemia Father   . Hypertension Father   . Hypertension Mother   . Heart disease Mother   . Diabetes Cousin    History  Substance Use Topics  . Smoking status: Former Smoker -- 6 years    Types: Cigarettes  . Smokeless tobacco: Never Used  . Alcohol Use: Yes     Comment: socially   OB History    No data available     Review of Systems  A complete 10 system review of systems was obtained and all systems are negative except as noted in the HPI and PMH.    Allergies  Review of patient's allergies indicates no known allergies.  Home Medications   Prior to  Admission medications   Medication Sig Start Date End Date Taking? Authorizing Provider  ALPRAZolam (XANAX) 0.5 MG tablet TAKE 1 TABLET BY MOUTH TWICE DAILY AS NEEDED FOR ANXIETY OR SLEEP 04/23/14  Yes Judy Pimple, MD  drospirenone-ethinyl estradiol (YASMIN,ZARAH,SYEDA) 3-0.03 MG tablet Take 1 tablet by mouth daily. 05/30/14  Yes Judy Pimple, MD  ibuprofen (ADVIL,MOTRIN) 200 MG tablet Take 200 mg by mouth every 6 (six) hours as needed for moderate pain.    Yes Historical Provider, MD  ondansetron (ZOFRAN) 4 MG tablet Take 1 tablet (4 mg total) by mouth every 8 (eight) hours as needed for nausea or vomiting. 04/24/13  Yes Judy Pimple, MD  SUMAtriptan (IMITREX) 100 MG tablet Take 1 tablet (100 mg total) by mouth every 2 (two) hours as needed for migraine. 04/24/13  Yes Judy Pimple, MD  omeprazole (PRILOSEC) 20 MG capsule Take 1 capsule (20 mg total) by mouth daily. Patient not taking: Reported on 06/15/2014 02/27/14   Marissa Sciacca, PA-C  ondansetron (ZOFRAN ODT) 4 MG disintegrating tablet  ODT q4 hours prn nausea/vomit 02/27/14   Marissa Sciacca, PA-C  oxyCODONE-acetaminophen (PERCOCET/ROXICET) 5-325 MG per tablet Take 1 tablet by mouth every 8 (eight) hours as needed for moderate pain or severe pain. Patient not taking: Reported on 06/15/2014 02/27/14   Marissa Sciacca, PA-C   BP 93/72 mmHg  Pulse 92  Temp(Src) 98 F (36.7 C) (Oral)  Resp 16  Ht 5\' 1"  (1.549 m)  Wt 180 lb (81.647 kg)  BMI 34.03 kg/m2  SpO2 100%  LMP 06/01/2014 Physical Exam  Constitutional: She is oriented to person, place, and time. She appears well-developed and well-nourished. No distress.  HENT:  Head: Normocephalic and atraumatic.  Mouth/Throat: Oropharynx is clear and moist. No oropharyngeal exudate.  Eyes: Conjunctivae and EOM are normal. Pupils are equal, round, and reactive to light.  Photophobic   Neck: Normal range of motion. Neck supple.  No meningismus.  Cardiovascular: Normal rate, regular  rhythm, normal heart sounds and intact distal pulses.   No murmur heard. Pulmonary/Chest: Effort normal and breath sounds normal. No respiratory distress.  Abdominal: Soft. There is no tenderness. There is no rebound and no guarding.  Musculoskeletal: Normal range of motion. She exhibits no edema or tenderness.  Neurological: She is alert and oriented to person, place, and time. No cranial nerve deficit. She exhibits normal muscle tone. Coordination normal.  No ataxia on finger to nose bilaterally. No pronator drift. 5/5 strength throughout. CN 2-12 intact. Negative Romberg. Equal grip strength. Sensation intact. Gait is normal.   Skin: Skin is warm.  Psychiatric: She has a normal mood and affect. Her behavior is normal.  Nursing note and vitals reviewed.   ED Course  Procedures (including critical care time)  DIAGNOSTIC STUDIES: Oxygen Saturation is 100% on RA, normal by my interpretation.    COORDINATION OF CARE: 8:48 AM Will give IV fluids, Zofran, Reglan, and Benadryl. Discussed treatment plan with pt at bedside and pt agreed to plan.  10:38 AM Upon recheck, patient states her migraine has resolved. Patient will be discharged home.    Labs Review Labs Reviewed - No data to display  Imaging Review No results found.   EKG Interpretation None      MDM   Final diagnoses:  Migraine without aura and without status migrainosus, not intractable   Typical migraine headache with photophobia and nausea x 3 days.  No fever, no thunderclap onset. No weakness, numbness, tingling.  Doubt SAH, meningitis, temporal arteritis.  Headache improved with Toradol, Reglan and Benadryl. Patient requesting to go home. She is feeling better and tolerating by mouth.  Will follow up with primary care physician. Return precautions discussed.   I personally performed the services described in this documentation, which was scribed in my presence. The recorded information has been reviewed and is  accurate.   Glynn OctaveStephen Ohn Bostic, MD 06/15/14 [phone]

## 2014-07-20 ENCOUNTER — Observation Stay: Payer: Self-pay | Admitting: Surgery

## 2014-07-31 ENCOUNTER — Other Ambulatory Visit: Payer: Self-pay | Admitting: Family Medicine

## 2014-07-31 MED ORDER — ALPRAZOLAM 0.5 MG PO TABS: ORAL_TABLET | ORAL | Status: DC

## 2014-07-31 NOTE — Telephone Encounter (Signed)
Xanax refill request.  Last seen 10/24/2013.  Last filled 04/23/2014.  Please advise.

## 2014-07-31 NOTE — Addendum Note (Signed)
Addended by: Judd GaudierLEVENS, SHANNON M on: 07/31/2014 04:27 PM   Modules accepted: Orders

## 2014-08-02 ENCOUNTER — Other Ambulatory Visit: Payer: Self-pay | Admitting: Family Medicine

## 2014-08-02 NOTE — Telephone Encounter (Signed)
Valtrex refill request.  Last seen 10/24/2013.  Last filled 02/27/2014.  Please advise.

## 2014-08-27 LAB — SURGICAL PATHOLOGY

## 2014-09-02 NOTE — H&P (Signed)
PATIENT NAME:  Yvonne Trujillo, Yvonne Trujillo MR#:  [id number] DATE OF BIRTH:  [birth year]  DATE OF ADMISSION:  07/20/2014  CHIEF COMPLAINT:  Right lower quadrant pain.   HISTORY OF PRESENT ILLNESS:  This is a patient who has never had an episode like this before, but presents with right lower quadrant pain and right-sided pain that started last night at 2100 hours. She did eat a fatty meal last night and the initial impression was that she might have gallstone disease, but an ultrasound was negative and a CT scan was positive for appendicitis. I was asked to see the patient for this diagnosis.   She has nausea, but no emesis. No melena or hematochezia, but has had 4 loose stools since last night and, again, has never had an episode like this before. She points to the right lower quadrant as the source of her pain and tenderness.   PAST MEDICAL HISTORY:  Migraines.   PAST SURGICAL HISTORY:  None.   ALLERGIES:  None.   MEDICATIONS:  Imitrex and Zofran periodically, last taken last week.   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 She does not smoke or drink. She is accompanied by her significant other and mother.   FAMILY HISTORY:  No history of appendicitis.   REVIEW OF SYSTEMS:  A complete system review is performed and negative with the exception of that mentioned in the HPI.   PHYSICAL EXAMINATION: GENERAL:  Obese, comfortable-appearing female patient with a BMI of 34, temperature 97, pulse 106, respirations 18, blood pressure 125/71, pain scale 7, and 100% room air saturation.  HEENT:  No scleral icterus.  NECK:  No palpable neck nodes.  CHEST:  Clear to auscultation.  CARDIAC:  Regular rate and rhythm.  ABDOMEN:  Soft, nondistended. There is a tattoo on the right lower quadrant at McBurney point, where the patient points. She has tenderness at McBurney point. No right upper quadrant tenderness. She has a positive Rovsing sign with guarding and rebound and percussion tenderness.   EXTREMITIES:  Without edema.  NEUROLOGIC:  Grossly intact.  INTEGUMENT:  Shows tattoos, but no jaundice.   LABORATORY DATA:  White blood cell count is elevated at 21,000. CT scan is personally reviewed, showing appendicitis. Electrolytes are within normal limits, but she is slightly acidotic with a CO2 of 21.   ASSESSMENT AND PLAN:  This is a patient with a first episode of right lower quadrant pain with a workup showing peritoneal signs, an elevated white blood cell count, and a positive CT scan. I have recommended laparoscopic appendectomy. Dr. Egbert GaribaldiBird will be performing the surgery later today. I have recommended admission and hydration and starting IV antibiotics. The options have been discussed, as well as the risks of bleeding, infection, conversion to an open procedure, bowel injury, and negative laparoscopy. This was all reviewed for her and her family. They understood and agreed to proceed.     ____________________________ Adah Salvageichard E. Excell Seltzerooper, MD rec:nb D: 07/20/2014 05:29:29 ET T: 07/20/2014 05:36:51 ET JOB#: [id number]  cc: Adah Salvageichard E. Excell Seltzerooper, MD, <Dictator> Lattie HawICHARD E Clydell Alberts MD ELECTRONICALLY SIGNED 07/20/2014 6:54

## 2014-09-02 NOTE — H&P (Signed)
Subjective/Chief Complaint rt side abd pain   History of Present Illness started last night 2100 no prior episode nausea, no emesis no f/c loose stools, no melena or hematoch points to RLQ no dysuria   Past History PMH migraines PSH none   Past Med/Surgical Hx:  Kidney Stones:   Denies surgical history.:   ALLERGIES:  No Known Allergies:   Family and Social History:  Family History Non-Contributory   Social History negative tobacco, negative ETOH, pharm tech, [employer]   Place of Living Home   Review of Systems:  Fever/Chills No   Cough No   Abdominal Pain Yes   Diarrhea Yes   Constipation No   Nausea/Vomiting Yes   SOB/DOE No   Chest Pain Yes   Dysuria No   Tolerating Diet No  Nauseated   Physical Exam:  GEN no acute distress, obese   HEENT pink conjunctivae   NECK supple   RESP normal resp effort  clear BS   CARD regular rate   ABD positive tenderness  no hernia  RLQ tenderness, guarding, rebound perc tenderness   LYMPH negative neck   EXTR negative edema   SKIN normal to palpation, tattoos   PSYCH alert, A+O to time, place, person, good insight   Lab Results: Hepatic:  18-Mar-16 00:18   Bilirubin, Total  < 0.1 (0.3-1.2 NOTE: New Reference Range  06/26/14)  Alkaline Phosphatase 76 (38-126 NOTE: New Reference Range  06/26/14)  SGPT (ALT)  13 (14-54 NOTE: New Reference Range  06/26/14)  SGOT (AST) 19 (15-41 NOTE: New Reference Range  06/26/14)  Total Protein, Serum 7.6 (6.5-8.1 NOTE: New Reference Range  06/26/14)  Albumin, Serum  3.2 (3.5-5.0 NOTE: New reference range  06/26/14)  Routine Chem:  18-Mar-16 00:18   Glucose, Serum  130 (65-99 NOTE: New Reference Range  06/26/14)  BUN 9 (6-20 NOTE: New Reference Range  06/26/14)  Creatinine (comp) 0.78 (0.44-1.00 NOTE: New Reference Range  06/26/14)  Sodium, Serum 135 (135-145 NOTE: New Reference Range  06/26/14)  Potassium, Serum 3.8 (3.5-5.1 NOTE: New Reference  Range  06/26/14)  Chloride, Serum 106 (101-111 NOTE: New Reference Range  06/26/14)  CO2, Serum  21 (22-32 NOTE: New Reference Range  06/26/14)  Calcium (Total), Serum  8.7 (8.9-10.3 NOTE: New Reference Range  06/26/14)  eGFR (African American) >60  eGFR (Non-African American) >60 (eGFR values <52m/min/1.73 m2 may be an indication of chronic kidney disease (CKD). Calculated eGFR is useful in patients with stable renal function. The eGFR calculation will not be reliable in acutely ill patients when serum creatinine is changing rapidly. It is not useful in patients on dialysis. The eGFR calculation may not be applicable to patients at the low and high extremes of body sizes, pregnant women, and vegetarians.)  Anion Gap 8  Lipase 49 (22-51 NOTE: New Reference Range  06/26/14)  Routine UA:  18-Mar-16 00:18   Color (UA) Straw  Clarity (UA) Clear  Glucose (UA) Negative  Bilirubin (UA) Negative  Ketones (UA) Negative  Specific Gravity (UA) 1.010  Blood (UA) Negative  pH (UA) 6.0  Protein (UA) Negative  Nitrite (UA) Negative  Leukocyte Esterase (UA) Negative (Result(s) reported on 20 Jul 2014 at 12:57AM.)  RBC (UA) <1 /HPF  WBC (UA) 1 /HPF  Bacteria (UA) NONE SEEN  Epithelial Cells (UA) 1 /HPF  Mucous (UA) PRESENT (Result(s) reported on 20 Jul 2014 at 12:57AM.)  Routine Hem:  18-Mar-16 00:18   WBC (CBC)  21.1  RBC (CBC) 4.54  Hemoglobin (CBC) 12.4  Hematocrit (CBC) 37.6  Platelet Count (CBC) 436  MCV 83  MCH 27.4  MCHC 33.0  RDW 12.4  Neutrophil % 74.8  Lymphocyte % 17.4  Monocyte % 5.9  Eosinophil % 0.9  Basophil % 1.0  Neutrophil #  15.8  Lymphocyte #  3.7  Monocyte #  1.3  Eosinophil # 0.2  Basophil #  0.2 (Result(s) reported on 20 Jul 2014 at 12:45AM.)   Radiology Results: [place]:    18-Mar-16 02:11, US Abdomen Limited Survey  US Abdomen Limited Survey  REASON FOR EXAM:    ruq pain  COMMENTS:   Body Site: Gallbladder, Liver, Common Bile  Duct    PROCEDURE: [place]  - US ABDOMEN LIMITED SURVEY  - Jul 20 2014  2:11AM     CLINICAL DATA:  Acute onset of right lower quadrant abdominal pain  after eating pizza.Initial encounter.    EXAM:  US ABDOMEN LIMITED - RIGHT UPPER QUADRANT    COMPARISON:  CT of the abdomen and pelvis from 06/20/2012    FINDINGS:  Gallbladder:  No gallstones or wall thickening visualized. No sonographic Murphy  sign noted.    Common bile duct:    Diameter: 0.3 cm, within normal limits in caliber.    Liver:    No focal lesion identified. Within normal limits in parenchymal  echogenicity.     IMPRESSION:  Unremarkable ultrasound of the right upper quadrant.  Electronically Signed   By: Garald Balding M.D.    On: 07/20/2014 02:54         Verified By: JEFFREY . CHANG, M.D.,  CT:    18-Mar-16 04:39, CT Abdomen and Pelvis With Contrast  CT Abdomen and Pelvis With Contrast  REASON FOR EXAM:    (1) rigth sided abd pain; (2) rigth sided abd pain  COMMENTS:       PROCEDURE: CT  - CT ABDOMEN / PELVIS  W  - Jul 20 2014  4:39AM     CLINICAL DATA:  Right upper quadrant pain radiating into back    EXAM:  CT ABDOMEN AND PELVIS WITH CONTRAST    TECHNIQUE:  Multidetector CT imaging of the abdomen and pelvis was performed  using the standard protocol following bolus administration of  intravenous contrast.  CONTRAST:  125 mL Omnipaque 300 intravenous    COMPARISON:  None.    FINDINGS:  There is acute inflammation surrounding the enlarged appendix. The  findings are typical of acute appendicitis. There are appendicoliths  within the lumen. There is no abscess. There is no extraluminal air.    There are normal appearances of the liver, spleen, pancreas,  adrenals and right kidney. There are several collecting system  calculi in the left kidney measuring up to 5 mm. There is no bowel  obstruction. There is no adenopathy. There is a small volume free  pelvic fluid in the cul-de-sac. No  significant abnormality is  evident in the lower chest. No significant musculoskeletal  abnormality is evident.     IMPRESSION:  Acute appendicitis    Left nephrolithiasis    These results were called by telephone at the time of interpretation  on 07/20/2014 at 4:44 am to Dr. Nance Pear , who verbally  acknowledged these results.      Electronically Signed    By: Andreas Newport M.D.    On: 07/20/2014 04:46     Verified By: Andreas Newport, M.D.,    Assessment/Admission Diagnosis acute appy lap appy risks options,  see dictation   Electronic Signatures: Florene Glen (MD)  (Signed 18-Mar-16 05:25)  Authored: CHIEF COMPLAINT and HISTORY, PAST MEDICAL/SURGIAL HISTORY, ALLERGIES, FAMILY AND SOCIAL HISTORY, REVIEW OF SYSTEMS, PHYSICAL EXAM, LABS, Radiology, ASSESSMENT AND PLAN   Last Updated: 18-Mar-16 05:25 by Florene Glen (MD)

## 2014-09-02 NOTE — Op Note (Signed)
PATIENT NAME:  Yvonne Trujillo, Yvonne Trujillo MR#:  [id number] DATE OF BIRTH:  07-10-1985  DATE OF PROCEDURE:  07/20/2014  PREOPERATIVE DIAGNOSIS: Acute appendicitis.   POSTOPERATIVE DIAGNOSIS: Acute appendicitis.   PROCEDURE PERFORMED: Laparoscopic appendectomy.   SURGEON: Natale LayMark Zanobia Griebel, M.D.   ASSISTANT: None.   ANESTHESIA: General endotracheal.   FINDINGS: Acute suppurative appendicitis without perforation.   SPECIMENS: Appendix to pathology.   ESTIMATED BLOOD LOSS: Minimal.   DESCRIPTION OF PROCEDURE: With informed consent obtained from the patient, she was brought to the operating room and positioned supine. General oroendotracheal anesthesia was induced. The patient's abdomen was widely clipped of hair, prepped and draped utilizing ChloraPrep solution.   Timeout was observed.   A 12 mm blunt Hassan trocar was placed through an open technique through an infraumbilical transverse oriented skin incision with stay sutures being passed through the fascia. Pneumoperitoneum was established. Laparoscopic evaluation of the abdomen demonstrated no obvious perforation or pus in the abdomen. A 5 mm trocar was placed in the right upper quadrant. A 5 mm bladeless trocar was placed in the left lower quadrant. The patient was then positioned in Trendelenburg and airplane right side up. The appendix was identified in the right lower quadrant. Omentum was loosely adherent to it and this was taken down. The appendix appeared to be acutely inflamed with suppurative changes. The appendix was elevated towards the anterior abdominal wall. A window was fashioned at the base of the appendix and the mesoappendix and the bowel. Mesentery was then divided with the Harmonic scalpel with advanced hemostasis feature. The confluence of the tinea was identified at this point. The base of the appendix was transected with 1 fire of the GIA stapler with blue load application.   The specimen was captured in an Endo Catch device and retrieved  and pneumoperitoneum was then re-established. The right lower quadrant was irrigated with 250 to 500 mL of normal saline and aspirated dry. Hemostasis on the operative field was found. Ports were then removed under direct visualization. The patient was then returned flat. The infraumbilical fascial defect was reapproximated with a figure-of-eight #0 Vicryl suture. A total of 30 mL of 0.25% plain Marcaine was infiltrated along all skin and fascial incisions prior to closure. Then 4-0 Vicryl subcuticular was applied to all skin edges. The patient was then subsequently extubated and taken to the recovery room in stable and satisfactory condition by anesthesia services.   ____________________________ Redge GainerMark A. Egbert GaribaldiBird, MD mab:sb D: 07/20/2014 13:09:14 ET T: 07/20/2014 13:17:12 ET JOB#: [id number]  cc: Loraine LericheMark A. Egbert GaribaldiBird, MD, <Dictator> Raynald KempMARK A Ahilyn Nell MD ELECTRONICALLY SIGNED 07/25/2014 13:42

## 2014-09-03 ENCOUNTER — Other Ambulatory Visit: Payer: Self-pay | Admitting: Family Medicine

## 2014-09-03 NOTE — Telephone Encounter (Signed)
Last office visit 10/24/2013 with Dr. Dayton MartesAron.  CPE scheduled for 12/12/2014 with Dr Milinda Antisower.  Ok to refill?

## 2014-09-03 NOTE — Telephone Encounter (Signed)
done

## 2014-09-03 NOTE — Telephone Encounter (Signed)
Please refill to get by to PE

## 2014-09-24 ENCOUNTER — Ambulatory Visit (INDEPENDENT_AMBULATORY_CARE_PROVIDER_SITE_OTHER): Payer: BLUE CROSS/BLUE SHIELD | Admitting: Primary Care

## 2014-09-24 ENCOUNTER — Encounter: Payer: Self-pay | Admitting: Primary Care

## 2014-09-24 VITALS — BP 122/74 | HR 99 | Temp 97.8°F | Ht 61.0 in | Wt 200.0 lb

## 2014-09-24 DIAGNOSIS — G43901 Migraine, unspecified, not intractable, with status migrainosus: Secondary | ICD-10-CM | POA: Diagnosis not present

## 2014-09-24 MED ORDER — KETOROLAC TROMETHAMINE 60 MG/2ML IM SOLN
60.0000 mg | Freq: Once | INTRAMUSCULAR | Status: AC
  Administered 2014-09-24: 60 mg via INTRAMUSCULAR

## 2014-09-24 MED ORDER — TOPIRAMATE 25 MG PO TABS: 25.0000 mg | ORAL_TABLET | Freq: Every day | ORAL | Status: DC

## 2014-09-24 NOTE — Progress Notes (Signed)
Pre visit review using our clinic review tool, if applicable. No additional management support is needed unless otherwise documented below in the visit note. 

## 2014-09-24 NOTE — Progress Notes (Signed)
Subjective:    Patient ID: Yvonne Trujillo, female    DOB: Nov 20, 1985, 29 y.o.   MRN: [id number]  HPI  [name] is a 29 year old female who presents today with a chief complaint of migraine headache. She has a history of chronic migraines that have been present since her teenage years. She was once evaluated by a headache specialist who couldn't find an exact trigger to her migraines and that they were not correlated to her menstrual cycle.  Typically her migraines occur once to twice monthly, but over the past 2-3 months her migraines have been weekly. They are mostly located to the temporal region.  She's never been on any medication for migraine prevention, but has been taught mechinisms of managing without medication which has not helped. Reports MRI one year ago which was normal. She is interested in preventative medication today.  Her recent migraine started to days ago and is located to the bilateral temporal region. She's been taking Imitrex (2 tabs) daily for the past 2 days without relief. Yesterday she also started experiencing sinus pressure to the frontal sinuses with some sneezing and itching to eyes. She denies cough, nasal congestion, fevers.  Review of Systems  Constitutional: Negative for fever and chills.  HENT: Positive for sinus pressure and sneezing. Negative for congestion, ear pain and sore throat.   Eyes: Positive for photophobia and itching.  Respiratory: Negative for cough and shortness of breath.   Cardiovascular: Negative for chest pain.  Gastrointestinal: Positive for nausea.  Neurological: Positive for headaches. Negative for dizziness.       Past Medical History  Diagnosis Date  . Migraine   . Kidney stone   . Kidney infection   . Concussion   . History of depression     History   Social History  . Marital Status: Single    Spouse Name: N/A  . Number of Children: N/A  . Years of Education: N/A   Occupational History  . Not on file.   Social  History Main Topics  . Smoking status: Former Smoker -- 6 years    Types: Cigarettes  . Smokeless tobacco: Never Used  . Alcohol Use: 0.0 oz/week    0 Standard drinks or equivalent per week     Comment: socially  . Drug Use: No  . Sexual Activity: Not on file   Other Topics Concern  . Not on file   Social History Narrative    No past surgical history on file.  Family History  Problem Relation Age of Onset  . Alcohol abuse Maternal Uncle   . Hyperlipidemia Mother   . Hyperlipidemia Father   . Hypertension Father   . Hypertension Mother   . Heart disease Mother   . Diabetes Cousin     No Known Allergies  Current Outpatient Prescriptions on File Prior to Visit  Medication Sig Dispense Refill  . ALPRAZolam (XANAX) 0.5 MG tablet TAKE 1 TABLET BY MOUTH TWICE DAILY AS NEEDED FOR ANXIETY/SLEEP 30 tablet 0  . drospirenone-ethinyl estradiol (YASMIN,ZARAH,SYEDA) 3-0.03 MG tablet Take 1 tablet by mouth daily. 1 Package 7  . ibuprofen (ADVIL,MOTRIN) 200 MG tablet Take 200 mg by mouth every 6 (six) hours as needed for moderate pain.     Marland Kitchen. ondansetron (ZOFRAN) 4 MG tablet TAKE 1 TABLET (4 MG TOTAL) BY MOUTH EVERY 8 (EIGHT) HOURS AS NEEDED FOR NAUSEA OR VOMITING. 90 tablet 0  . SUMAtriptan (IMITREX) 100 MG tablet TAKE 1 TABLET BY MOUTH EVERY  2 HOURS AS NEEDED FOR MIGRAINE 10 tablet 3  . valACYclovir (VALTREX) 1000 MG tablet TAKE 2 TABLETS BY MOUTH 2 TIMES A DAY FOR 1 DAY 20 tablet 0   No current facility-administered medications on file prior to visit.    BP 122/74 mmHg  Pulse 99  Temp(Src) 97.8 F (36.6 C) (Oral)  Ht  (1.549 m)  Wt 200 lb (90.719 kg)  BMI 37.81 kg/m2  SpO2 97%  LMP 09/14/2014    Objective:   Physical Exam  Constitutional: She is oriented to person, place, and time. She appears well-nourished.  Appears to be in pain  HENT:  Right Ear: Tympanic membrane and ear canal normal.  Left Ear: Tympanic membrane and ear canal normal.  Nose: Nose normal.    Mouth/Throat: Oropharynx is clear and moist.  Eyes: Conjunctivae and EOM are normal. Pupils are equal, round, and reactive to light.  Neck: Neck supple.  Cardiovascular: Normal rate and regular rhythm.   Pulmonary/Chest: Effort normal and breath sounds normal.  Lymphadenopathy:    She has no cervical adenopathy.  Neurological: She is alert and oriented to person, place, and time.  Skin: Skin is warm and dry.          Assessment & Plan:  Start daily allegra, zyrtec, claritin for allergy symptoms once migraine is aborted.

## 2014-09-24 NOTE — Patient Instructions (Signed)
You've been provided with a shot of Toradol for your migraine. You may take a benadryl once you get home. If you have no resolve of this migraine despite medication, then you may need to go to the urgent care. Start Topamax tablets daily for migraines. Take 1 tablet by mouth at bedtime for migraine prevention. Continue taking Claritin for allergy symptoms. Please notify me if your sinus headache does not improve. Follow up in 1 month for re-evaluation.

## 2014-09-24 NOTE — Assessment & Plan Note (Signed)
Present for 2 days to temporal region (photophobia, phonophobia, nausea) without relief from 2 day treatment of imitrex. Toradol 60 mg IM provided today. She is driving so no phenergan was administered. Start Topamax 25 mg tablets at bedtime for prevention. Follow up in one month for re-evaluation.

## 2014-10-26 ENCOUNTER — Ambulatory Visit: Payer: BLUE CROSS/BLUE SHIELD | Admitting: Primary Care

## 2014-11-05 ENCOUNTER — Encounter (HOSPITAL_COMMUNITY): Payer: Self-pay | Admitting: Emergency Medicine

## 2014-11-05 ENCOUNTER — Emergency Department (HOSPITAL_COMMUNITY)
Admission: EM | Admit: 2014-11-05 | Discharge: 2014-11-05 | Disposition: A | Discharge status: Home / Self Care | Payer: BLUE CROSS/BLUE SHIELD | Attending: Emergency Medicine | Admitting: Emergency Medicine

## 2014-11-05 DIAGNOSIS — Z3202 Encounter for pregnancy test, result negative: Secondary | ICD-10-CM | POA: Insufficient documentation

## 2014-11-05 DIAGNOSIS — Z87828 Personal history of other (healed) physical injury and trauma: Secondary | ICD-10-CM | POA: Insufficient documentation

## 2014-11-05 DIAGNOSIS — Z79899 Other long term (current) drug therapy: Secondary | ICD-10-CM | POA: Insufficient documentation

## 2014-11-05 DIAGNOSIS — F329 Major depressive disorder, single episode, unspecified: Secondary | ICD-10-CM | POA: Insufficient documentation

## 2014-11-05 DIAGNOSIS — G43809 Other migraine, not intractable, without status migrainosus: Secondary | ICD-10-CM | POA: Insufficient documentation

## 2014-11-05 DIAGNOSIS — Z87442 Personal history of urinary calculi: Secondary | ICD-10-CM | POA: Insufficient documentation

## 2014-11-05 DIAGNOSIS — Z87891 Personal history of nicotine dependence: Secondary | ICD-10-CM | POA: Insufficient documentation

## 2014-11-05 DIAGNOSIS — Z8619 Personal history of other infectious and parasitic diseases: Secondary | ICD-10-CM | POA: Insufficient documentation

## 2014-11-05 LAB — POC URINE PREG, ED: PREG TEST UR: NEGATIVE

## 2014-11-05 MED ORDER — METOCLOPRAMIDE HCL 5 MG/ML IJ SOLN
10.0000 mg | Freq: Once | INTRAMUSCULAR | Status: AC
  Administered 2014-11-05: 10 mg via INTRAMUSCULAR
  Filled 2014-11-05: qty 2

## 2014-11-05 MED ORDER — DIPHENHYDRAMINE HCL 50 MG/ML IJ SOLN
25.0000 mg | Freq: Once | INTRAMUSCULAR | Status: AC
  Administered 2014-11-05: 25 mg via INTRAMUSCULAR
  Filled 2014-11-05: qty 1

## 2014-11-05 MED ORDER — KETOROLAC TROMETHAMINE 60 MG/2ML IM SOLN
60.0000 mg | Freq: Once | INTRAMUSCULAR | Status: AC
  Administered 2014-11-05: 60 mg via INTRAMUSCULAR
  Filled 2014-11-05: qty 2

## 2014-11-05 NOTE — ED Provider Notes (Signed)
CSN: [id number]     Arrival date & time 11/05/14  0754 History   First MD Initiated Contact with Patient 11/05/14 [phone]     Chief Complaint  Patient presents with  . Migraine     (Consider location/radiation/quality/duration/timing/severity/associated sxs/prior Treatment) HPI Comments: H/o migraine and this is similar, bi-temporal and with photophobia and phonophobia--no fever or chills --no rashes--used her home meds without relief, no focal neuro deficits  Patient is a 29 y.o. female presenting with migraines. The history is provided by the patient.  Migraine This is a recurrent problem. The current episode started more than 2 days ago. The problem occurs constantly. The problem has not changed since onset.Associated symptoms include headaches. Pertinent negatives include no chest pain and no shortness of breath.    Past Medical History  Diagnosis Date  . Migraine   . Kidney stone   . Kidney infection   . Concussion   . History of depression    Past Surgical History  Procedure Laterality Date  . Appendectomy     Family History  Problem Relation Age of Onset  . Alcohol abuse Maternal Uncle   . Hyperlipidemia Mother   . Hyperlipidemia Father   . Hypertension Father   . Hypertension Mother   . Heart disease Mother   . Diabetes Cousin    History  Substance Use Topics  . Smoking status: Former Smoker -- 6 years    Types: Cigarettes  . Smokeless tobacco: Never Used  . Alcohol Use: 0.0 oz/week    0 Standard drinks or equivalent per week     Comment: socially   OB History    No data available     Review of Systems  Respiratory: Negative for shortness of breath.   Cardiovascular: Negative for chest pain.  Neurological: Positive for headaches.  All other systems reviewed and are negative.     Allergies  Review of patient's allergies indicates no known allergies.  Home Medications   Prior to Admission medications   Medication Sig Start Date End Date Taking?  Authorizing Provider  ALPRAZolam (XANAX) 0.5 MG tablet TAKE 1 TABLET BY MOUTH TWICE DAILY AS NEEDED FOR ANXIETY/SLEEP 07/31/14   Amy Michelle Nasuti, MD  drospirenone-ethinyl estradiol (YASMIN,ZARAH,SYEDA) 3-0.03 MG tablet Take 1 tablet by mouth daily. 05/30/14   Judy Pimple, MD  ibuprofen (ADVIL,MOTRIN) 200 MG tablet Take 200 mg by mouth every 6 (six) hours as needed for moderate pain.     Historical Provider, MD  ondansetron (ZOFRAN) 4 MG tablet TAKE 1 TABLET (4 MG TOTAL) BY MOUTH EVERY 8 (EIGHT) HOURS AS NEEDED FOR NAUSEA OR VOMITING. 07/31/14   Amy Michelle Nasuti, MD  SUMAtriptan (IMITREX) 100 MG tablet TAKE 1 TABLET BY MOUTH EVERY 2 HOURS AS NEEDED FOR MIGRAINE 09/03/14   Judy Pimple, MD  topiramate (TOPAMAX) 25 MG tablet Take 1 tablet (25 mg total) by mouth at bedtime. 09/24/14   Doreene Nest, NP  valACYclovir (VALTREX) 1000 MG tablet TAKE 2 TABLETS BY MOUTH 2 TIMES A DAY FOR 1 DAY 08/02/14   Dianne Dun, MD   BP 110/63 mmHg  Pulse 88  Temp(Src) 98.4 F (36.9 C) (Oral)  Resp 18  SpO2 99%  LMP 10/12/2014 (Approximate) Physical Exam  Constitutional: She is oriented to person, place, and time. She appears well-developed and well-nourished.  Non-toxic appearance. No distress.  HENT:  Head: Normocephalic and atraumatic.  Eyes: Conjunctivae, EOM and lids are normal. Pupils are equal, round, and reactive to light.  Neck: Normal range of motion. Neck supple. No tracheal deviation present. No thyroid mass present.  Cardiovascular: Normal rate, regular rhythm and normal heart sounds.  Exam reveals no gallop.   No murmur heard. Pulmonary/Chest: Effort normal and breath sounds normal. No stridor. No respiratory distress. She has no decreased breath sounds. She has no wheezes. She has no rhonchi. She has no rales.  Abdominal: Soft. Normal appearance and bowel sounds are normal. She exhibits no distension. There is no tenderness. There is no rebound and no CVA tenderness.  Musculoskeletal: Normal range of  motion. She exhibits no edema or tenderness.  Neurological: She is alert and oriented to person, place, and time. She has normal strength. No cranial nerve deficit or sensory deficit. GCS eye subscore is 4. GCS verbal subscore is 5. GCS motor subscore is 6.  Skin: Skin is warm and dry. No abrasion and no rash noted.  Psychiatric: She has a normal mood and affect. Her speech is normal and behavior is normal.  Nursing note and vitals reviewed.   ED Course  Procedures (including critical care time) Labs Review Labs Reviewed  POC URINE PREG, ED    Imaging Review No results found.   EKG Interpretation None      MDM   Final diagnoses:  None    Patient given migraine cocktail here feels better. No red flags for meningitis or subarachnoid hemorrhage. Stable for discharge    Lorre NickAnthony Sloka Volante, MD 11/05/14 [id number]

## 2014-11-05 NOTE — ED Notes (Signed)
Pt ambulated to the bathroom with ease 

## 2014-11-05 NOTE — ED Notes (Signed)
Pt reports migraine x 3days. Pt has hx of the same and takes Topamax for a preventative measure. NAD at this time. Pt alert x4.

## 2014-11-05 NOTE — Discharge Instructions (Signed)

## 2014-12-12 ENCOUNTER — Encounter: Payer: Self-pay | Admitting: Family Medicine

## 2015-01-01 ENCOUNTER — Other Ambulatory Visit: Payer: Self-pay

## 2015-01-01 MED ORDER — DROSPIRENONE-ETHINYL ESTRADIOL 3-0.03 MG PO TABS: 1.0000 | ORAL_TABLET | Freq: Every day | ORAL | Status: DC

## 2015-01-01 NOTE — Telephone Encounter (Signed)
Pt left v/m requesting refill BC pill x 1 month and then pt will have ins and call for appt.[facility] Refill done x 1. Left v/m for pt to ck with pharmacy.

## 2015-01-18 ENCOUNTER — Encounter: Payer: Self-pay | Admitting: Primary Care

## 2015-01-18 ENCOUNTER — Ambulatory Visit (INDEPENDENT_AMBULATORY_CARE_PROVIDER_SITE_OTHER): Payer: PRIVATE HEALTH INSURANCE | Admitting: Primary Care

## 2015-01-18 VITALS — BP 116/78 | HR 71 | Temp 98.0°F | Ht 61.0 in | Wt 202.4 lb

## 2015-01-18 DIAGNOSIS — B001 Herpesviral vesicular dermatitis: Secondary | ICD-10-CM

## 2015-01-18 DIAGNOSIS — Z3041 Encounter for surveillance of contraceptive pills: Secondary | ICD-10-CM | POA: Diagnosis not present

## 2015-01-18 DIAGNOSIS — Z0000 Encounter for general adult medical examination without abnormal findings: Secondary | ICD-10-CM

## 2015-01-18 DIAGNOSIS — G43901 Migraine, unspecified, not intractable, with status migrainosus: Secondary | ICD-10-CM

## 2015-01-18 DIAGNOSIS — F411 Generalized anxiety disorder: Secondary | ICD-10-CM | POA: Diagnosis not present

## 2015-01-18 DIAGNOSIS — F418 Other specified anxiety disorders: Secondary | ICD-10-CM

## 2015-01-18 DIAGNOSIS — Z01419 Encounter for gynecological examination (general) (routine) without abnormal findings: Secondary | ICD-10-CM

## 2015-01-18 DIAGNOSIS — B009 Herpesviral infection, unspecified: Secondary | ICD-10-CM | POA: Insufficient documentation

## 2015-01-18 LAB — LIPID PANEL
CHOL/HDL RATIO: 4
Cholesterol: 207 mg/dL — ABNORMAL HIGH (ref 0–200)
HDL: 47.8 mg/dL (ref >39.00)
LDL CALC: 127 mg/dL — AB (ref 0–99)
NonHDL: 159.66
Triglycerides: 163 mg/dL — ABNORMAL HIGH (ref 0.0–149.0)
VLDL: 32.6 mg/dL (ref 0.0–40.0)

## 2015-01-18 LAB — COMPREHENSIVE METABOLIC PANEL
ALT: 15 U/L (ref 0–35)
AST: 13 U/L (ref 0–37)
Albumin: 3.5 g/dL (ref 3.5–5.2)
Alkaline Phosphatase: 78 U/L (ref 39–117)
BUN: 10 mg/dL (ref 6–23)
CHLORIDE: 103 meq/L (ref 96–112)
CO2: 23 meq/L (ref 19–32)
CREATININE: 0.85 mg/dL (ref 0.40–1.20)
Calcium: 8.8 mg/dL (ref 8.4–10.5)
GFR: 85.16 mL/min (ref >60.00)
Glucose, Bld: 91 mg/dL (ref 70–99)
Potassium: 4 mEq/L (ref 3.5–5.1)
SODIUM: 134 meq/L — AB (ref 135–145)
Total Bilirubin: 0.3 mg/dL (ref 0.2–1.2)
Total Protein: 7.4 g/dL (ref 6.0–8.3)

## 2015-01-18 LAB — CBC
HCT: 37.3 % (ref 36.0–46.0)
Hemoglobin: 12.7 g/dL (ref 12.0–15.0)
MCHC: 34 g/dL (ref 30.0–36.0)
MCV: 83 fl (ref 78.0–100.0)
Platelets: 428 10*3/uL — ABNORMAL HIGH (ref 150.0–400.0)
RBC: 4.49 Mil/uL (ref 3.87–5.11)
RDW: 13 % (ref 11.5–15.5)
WBC: 10.7 10*3/uL — ABNORMAL HIGH (ref 4.0–10.5)

## 2015-01-18 LAB — TSH: TSH: 2.69 u[IU]/mL (ref 0.35–4.50)

## 2015-01-18 MED ORDER — ONDANSETRON HCL 4 MG PO TABS: ORAL_TABLET | ORAL | Status: DC

## 2015-01-18 MED ORDER — DROSPIRENONE-ETHINYL ESTRADIOL 3-0.03 MG PO TABS: 1.0000 | ORAL_TABLET | Freq: Every day | ORAL | Status: DC

## 2015-01-18 MED ORDER — SUMATRIPTAN SUCCINATE 100 MG PO TABS: ORAL_TABLET | ORAL | Status: DC

## 2015-01-18 MED ORDER — ALPRAZOLAM 0.5 MG PO TABS: ORAL_TABLET | ORAL | Status: DC

## 2015-01-18 MED ORDER — VALACYCLOVIR HCL 1 G PO TABS: ORAL_TABLET | ORAL | Status: DC

## 2015-01-18 MED ORDER — TOPIRAMATE 50 MG PO TABS: ORAL_TABLET | ORAL | Status: DC

## 2015-01-18 NOTE — Progress Notes (Signed)
Pre visit review using our clinic review tool, if applicable. No additional management support is needed unless otherwise documented below in the visit note. 

## 2015-01-18 NOTE — Progress Notes (Signed)
Subjective:    Patient ID: Yvonne Trujillo, female    DOB: [birth year], 29 y.o.   MRN: [id number]  HPI  Yvonne Trujillo is a 29 year old female who presents today for complete physical.  Immunizations: -Tetanus: Completed within 10 years. -Influenza: Will obtain through work.   Diet: Endorses a fair diet. Breakfast: Cereal, bagel Lunch: Grilled chicken, veggies, sometimes fries Dinner: Grilled or baked chicken, veggies (corn), bread, rice Snacks: None Desserts: Occasional Beverages: Coke or water Exercise: She's trying to exercise but has been sporadic. Walks on treadmill for 20-30 minutes, some weights. Eye exam: Completed in 2016 Dental exam: Completed in 2016 Pap Smear: Completed in 2015, normal.  1) Migraines: Longstanding history. Mostly located to the temporal region. She was initiated on Topamax 25 mg which has reduced in intensity and frequency, but is still having them. She will get photophobia, nausea, phonophobia with migraines.   Review of Systems  Constitutional: Negative for unexpected weight change.  HENT: Negative for rhinorrhea.   Respiratory: Negative for cough and shortness of breath.   Cardiovascular: Negative for chest pain.  Gastrointestinal: Negative for abdominal pain, diarrhea and constipation.  Genitourinary: Negative for difficulty urinating.       Regular periods  Musculoskeletal: Negative for myalgias and arthralgias.  Skin: Negative for rash.  Neurological: Positive for headaches. Negative for dizziness and numbness.  Psychiatric/Behavioral:       Denies concerns for anxiety or depression       Past Medical History  Diagnosis Date  . Migraine   . Kidney stone   . Kidney infection   . Concussion   . History of depression     Social History   Social History  . Marital Status: Single    Spouse Name: N/A  . Number of Children: N/A  . Years of Education: N/A   Occupational History  . Not on file.   Social History Main Topics  . Smoking  status: Former Smoker -- 6 years    Types: Cigarettes  . Smokeless tobacco: Never Used  . Alcohol Use: 0.0 oz/week    0 Standard drinks or equivalent per week     Comment: socially  . Drug Use: No  . Sexual Activity: Not on file   Other Topics Concern  . Not on file   Social History Narrative    Past Surgical History  Procedure Laterality Date  . Appendectomy      Family History  Problem Relation Age of Onset  . Alcohol abuse Maternal Uncle   . Hyperlipidemia Mother   . Hyperlipidemia Father   . Hypertension Father   . Hypertension Mother   . Heart disease Mother   . Diabetes Cousin     No Known Allergies  Current Outpatient Prescriptions on File Prior to Visit  Medication Sig Dispense Refill  . ibuprofen (ADVIL,MOTRIN) 200 MG tablet Take 200 mg by mouth every 6 (six) hours as needed for moderate pain.      No current facility-administered medications on file prior to visit.    BP 116/78 mmHg  Pulse 71  Temp(Src) 98 F (36.7 C) (Oral)  Ht 5\' 1"  (1.549 m)  Wt 202 lb 6.4 oz (91.808 kg)  BMI 38.26 kg/m2  SpO2 98%  LMP 01/04/2015    Objective:   Physical Exam  Constitutional: She is oriented to person, place, and time. She appears well-nourished.  HENT:  Right Ear: Tympanic membrane and ear canal normal.  Left Ear: Tympanic membrane and ear canal  normal.  Nose: Nose normal.  Mouth/Throat: Oropharynx is clear and moist.  Eyes: Conjunctivae and EOM are normal. Pupils are equal, round, and reactive to light.  Neck: Neck supple. No thyromegaly present.  Cardiovascular: Normal rate and regular rhythm.   Pulmonary/Chest: Effort normal and breath sounds normal.  Abdominal: Soft. Bowel sounds are normal. There is no tenderness.  Musculoskeletal: Normal range of motion.  Lymphadenopathy:    She has no cervical adenopathy.  Neurological: She is alert and oriented to person, place, and time. She has normal reflexes.  Skin: Skin is warm and dry.  Psychiatric: She  has a normal mood and affect.          Assessment & Plan:

## 2015-01-18 NOTE — Patient Instructions (Signed)
Complete lab work prior to leaving today. I will notify you of your results.  Start topiramate 50 mg tablets for migraines. You may take 2 of the 25 mg tablets until your current bottle is empty. Start taking your tablet around 5pm to 6pm in the evening.  Start exercising. You need 1 hour of moderate intensity exercise 5 days a week.  Reduce consumption of soda.  Follow up in 6 weeks with PCP or myself for further evaluation of migraines.  It was a pleasure to see you today!

## 2015-01-18 NOTE — Assessment & Plan Note (Signed)
Some improvement with Topamax 25 mg although continues to experience migraines. Neuro exam unremarkable. Will increase dose to 50 mg in the evening. She is to follow up with PCP or myself in 6 weeks.

## 2015-01-18 NOTE — Assessment & Plan Note (Signed)
Pap completed in 2015 and was normal. Birth control refilled.

## 2015-01-18 NOTE — Assessment & Plan Note (Signed)
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 Refill provided today.

## 2015-01-18 NOTE — Assessment & Plan Note (Signed)
Tetanus and pap UTD. Will get flu shot at occupation. Exam unremarkable, labs pending. Discussed the importance of healthy diet and exercise. Follow up in 1 year for repeat physical.

## 2015-01-18 NOTE — Assessment & Plan Note (Signed)
Uses Xanax sparingly. 1 refill provided today. Overall she feels well controlled.

## 2015-01-21 ENCOUNTER — Telehealth: Payer: Self-pay | Admitting: Family Medicine

## 2015-01-21 NOTE — Telephone Encounter (Signed)
Called and notified patient of Yvonne Trujillo's comments. Patient verbalized understanding.  

## 2015-01-21 NOTE — Telephone Encounter (Signed)
Pt returned your call - please call back [phone] Thanks

## 2015-01-21 NOTE — Telephone Encounter (Signed)
Pt returned your call says you have the best number

## 2015-01-21 NOTE — Telephone Encounter (Signed)
I left a message for the patient to return my call.

## 2015-01-25 ENCOUNTER — Encounter: Payer: BLUE CROSS/BLUE SHIELD | Admitting: Primary Care

## 2015-02-13 ENCOUNTER — Ambulatory Visit (INDEPENDENT_AMBULATORY_CARE_PROVIDER_SITE_OTHER): Payer: PRIVATE HEALTH INSURANCE | Admitting: Family Medicine

## 2015-02-13 ENCOUNTER — Encounter: Payer: Self-pay | Admitting: Family Medicine

## 2015-02-13 VITALS — BP 104/70 | HR 91 | Temp 98.4°F | Ht 61.0 in | Wt 202.2 lb

## 2015-02-13 DIAGNOSIS — J209 Acute bronchitis, unspecified: Secondary | ICD-10-CM | POA: Diagnosis not present

## 2015-02-13 MED ORDER — HYDROCODONE-HOMATROPINE 5-1.5 MG/5ML PO SYRP: 5.0000 mL | ORAL_SOLUTION | Freq: Three times a day (TID) | ORAL | Status: DC | PRN

## 2015-02-13 MED ORDER — BENZONATATE 200 MG PO CAPS: 200.0000 mg | ORAL_CAPSULE | Freq: Three times a day (TID) | ORAL | Status: DC | PRN

## 2015-02-13 MED ORDER — ALBUTEROL SULFATE HFA 108 (90 BASE) MCG/ACT IN AERS: 2.0000 | INHALATION_SPRAY | RESPIRATORY_TRACT | Status: DC | PRN

## 2015-02-13 NOTE — Progress Notes (Signed)
Subjective:    Patient ID: Yvonne Trujillo, female    DOB: 1985-05-16, 29 y.o.   MRN: [id number]  HPI Here with uri for 3-4 days   Congestion in chest  Dry deep cough  Whole chest is sore from coughing  Little wheezing  Feels like bronchitis Not productive  No rattling but feels congestion in chest when she lies down   Today feels dizzy  No fever   Taking mucinex   Used an old proair inhaler   No nasal symptoms  No hoarseness   Patient Active Problem List   Diagnosis Date Noted  . Herpes simplex 01/18/2015  . Encounter for routine gynecological examination 06/12/2013  . Screen for STD (sexually transmitted disease) 06/12/2013  . Leukocytosis, unspecified 06/12/2013  . Hyperlipidemia 04/25/2013  . Amenorrhea 04/24/2013  . Migraine 04/24/2013  . Depression with anxiety 04/24/2013  . Hirsutism 04/24/2013  . Acne 04/24/2013  . History of kidney stones 04/24/2013  . Screening for diabetes mellitus 04/24/2013  . Routine general medical examination at a health care facility 04/24/2013   Past Medical History  Diagnosis Date  . Migraine   . Kidney stone   . Kidney infection   . Concussion   . History of depression    Past Surgical History  Procedure Laterality Date  . Appendectomy     Social History  Substance Use Topics  . Smoking status: Former Smoker -- 6 years    Types: Cigarettes  . Smokeless tobacco: Never Used  . Alcohol Use: 0.0 oz/week    0 Standard drinks or equivalent per week     Comment: socially   Family History  Problem Relation Age of Onset  . Alcohol abuse Maternal Uncle   . Hyperlipidemia Mother   . Hyperlipidemia Father   . Hypertension Father   . Hypertension Mother   . Heart disease Mother   . Diabetes Cousin    No Known Allergies Current Outpatient Prescriptions on File Prior to Visit  Medication Sig Dispense Refill  . ALPRAZolam (XANAX) 0.5 MG tablet TAKE 1 TABLET BY MOUTH TWICE DAILY AS NEEDED FOR ANXIETY/SLEEP 30 tablet 0  .  drospirenone-ethinyl estradiol (YASMIN,ZARAH,SYEDA) 3-0.03 MG tablet Take 1 tablet by mouth daily. 1 Package 11  . ibuprofen (ADVIL,MOTRIN) 200 MG tablet Take 200 mg by mouth every 6 (six) hours as needed for moderate pain.     Marland Kitchen. ondansetron (ZOFRAN) 4 MG tablet TAKE 1 TABLET (4 MG TOTAL) BY MOUTH EVERY 8 (EIGHT) HOURS AS NEEDED FOR NAUSEA OR VOMITING. 90 tablet 0  . SUMAtriptan (IMITREX) 100 MG tablet Take 1 tablet by mouth at onset of migraine. May repeat in 2 hours if no improvement. Do not exceed 2 tablets in 24 hours. 10 tablet 3  . topiramate (TOPAMAX) 50 MG tablet Take 1 tablet by mouth every evening for migraine prevention. 30 tablet 3  . valACYclovir (VALTREX) 1000 MG tablet TAKE 2 TABLETS BY MOUTH 2 TIMES A DAY FOR 1 DAY 20 tablet 0   No current facility-administered medications on file prior to visit.    Review of Systems Review of Systems  Constitutional: Negative for fever, appetite change, and unexpected weight change. pos for fatigue ENT neg for sinus pain or cong Eyes: Negative for pain and visual disturbance.  Respiratory: pos for harsh cough/neg for prod/pos for tight chest/wheeze  Cardiovascular: Negative for cp or palpitations    Gastrointestinal: Negative for nausea, diarrhea and constipation.  Genitourinary: Negative for urgency and frequency.  Skin: Negative  for pallor or rash   Neurological: Negative for weakness, light-headedness, numbness and headaches.  Hematological: Negative for adenopathy. Does not bruise/bleed easily.  Psychiatric/Behavioral: Negative for dysphoric mood. The patient is not nervous/anxious.         Objective:   Physical Exam  Constitutional: She appears well-developed and well-nourished. No distress.  HENT:  Head: Normocephalic and atraumatic.  Right Ear: External ear normal.  Left Ear: External ear normal.  Mouth/Throat: Oropharynx is clear and moist.  Nares are boggy No sinus tenderness  Eyes: Conjunctivae and EOM are normal. Pupils  are equal, round, and reactive to light. Right eye exhibits no discharge. Left eye exhibits no discharge.  Neck: Normal range of motion. Neck supple.  Cardiovascular: Normal rate, regular rhythm and normal heart sounds.   Pulmonary/Chest: Effort normal. No respiratory distress. She has wheezes. She has no rales. She exhibits no tenderness.  Harsh bs with good air exch No rales or rhonchi  Wheeze only on forced expiration   Lymphadenopathy:    She has no cervical adenopathy.  Neurological: She is alert.  Skin: Skin is warm and dry. No rash noted.  Psychiatric: She has a normal mood and affect.          Assessment & Plan:   Problem List Items Addressed This Visit      Respiratory   Acute bronchitis - Primary    Suspect viral -with dry cough and very mild reactive airways (former smoker) Disc symptomatic care - see instructions on AVS Hycodan prn when not work/drive/ tessalon tid  Albuterol prn -if worse would consider prednisone  mucinex prn if prod cough Update if not starting to improve in a week or if worsening

## 2015-02-13 NOTE — Progress Notes (Signed)
Pre visit review using our clinic review tool, if applicable. No additional management support is needed unless otherwise documented below in the visit note. 

## 2015-02-13 NOTE — Patient Instructions (Signed)
I think you have viral bronchitis  Drink fluids/rest when you can  Take hycodan when not working or driving Tessalon three times daily  Inhaler when needed  Update if not starting to improve in a week or if worsening

## 2015-02-13 NOTE — Assessment & Plan Note (Signed)
Suspect viral -with dry cough and very mild reactive airways (former smoker) Disc symptomatic care - see instructions on AVS Hycodan prn when not work/drive/ tessalon tid  Albuterol prn -if worse would consider prednisone  mucinex prn if prod cough Update if not starting to improve in a week or if worsening

## 2015-02-18 ENCOUNTER — Other Ambulatory Visit: Payer: Self-pay | Admitting: Family Medicine

## 2015-02-18 ENCOUNTER — Telehealth: Payer: Self-pay

## 2015-02-18 MED ORDER — AZITHROMYCIN 250 MG PO TABS: ORAL_TABLET | ORAL | Status: DC

## 2015-02-18 MED ORDER — PREDNISONE 10 MG PO TABS: ORAL_TABLET | ORAL | Status: DC

## 2015-02-18 NOTE — Telephone Encounter (Signed)
Pt left v/m; pt was seen on 02/13/15 and pt cannot see any improvement in symptoms since seen. Pt was advised to cb with update in one week if no better. Unable to reach pt by phone to get more detailed update.

## 2015-02-18 NOTE — Telephone Encounter (Signed)
Left voicemail letting pt know Rx sent to pharmacy and to f/u if no improvement  

## 2015-02-18 NOTE — Telephone Encounter (Signed)
Presently pt is non prod cough with wheezing which is worse at night;pt has SOB upon any exertion.tessalon not helping cough; proair helping slightly and hycodan helps cough at night. no fever . [facility]. Pt request cb.

## 2015-02-18 NOTE — Telephone Encounter (Signed)
I sent in px for zpack and also prednisone  F/u if worse or no improvement

## 2015-04-03 ENCOUNTER — Other Ambulatory Visit: Payer: Self-pay | Admitting: Primary Care

## 2015-04-04 NOTE — Telephone Encounter (Signed)
Electronically refill request.  Last prescribed by Jae DireKate on 9/16/20016. Last seen on 02/13/2015. No future appointment.

## 2015-04-04 NOTE — Telephone Encounter (Signed)
Px written for call in   

## 2015-04-04 NOTE — Telephone Encounter (Signed)
Rx called in as prescribed 

## 2015-04-30 ENCOUNTER — Other Ambulatory Visit: Payer: Self-pay | Admitting: Family Medicine

## 2015-05-01 NOTE — Telephone Encounter (Signed)
Rx called in as prescribed 

## 2015-05-01 NOTE — Telephone Encounter (Signed)
Pt had CPE on 01/25/15 with Mayra ReelKate Clark, NP, last refilled on 04/04/15 #30 with 0 refill, please advise

## 2015-05-01 NOTE — Telephone Encounter (Signed)
Px written for call in   

## 2015-06-03 ENCOUNTER — Other Ambulatory Visit: Payer: Self-pay | Admitting: *Deleted

## 2015-06-03 MED ORDER — ALPRAZOLAM 0.5 MG PO TABS: ORAL_TABLET | ORAL | Status: DC

## 2015-06-03 NOTE — Telephone Encounter (Signed)
Rx called in as prescribed 

## 2015-06-03 NOTE — Telephone Encounter (Signed)
Px written for call in   

## 2015-06-03 NOTE — Telephone Encounter (Signed)
Received fax requesting Rx be sent to [facility] (pt's new pharmacy), last refilled on 05/01/15 #30 with 0 refills, pt had CPE with Mayra Reel, NP on 01/18/15, please advise

## 2015-07-11 IMAGING — CT CT ABD-PELV W/ CM
2 of 4 series · 17 of 46 positions shown, 19 images · IV contrast (omnipaque)
Comparison: None.

CLINICAL DATA: Right upper quadrant pain radiating into back

EXAM:
CT ABDOMEN AND PELVIS WITH CONTRAST
TECHNIQUE: Multidetector CT imaging of the abdomen and pelvis was performed
using the standard protocol following bolus administration of
intravenous contrast.
CONTRAST:  125 mL Omnipaque 300 intravenous

[Series 2: routine abd pel with · axial · 0.76mm/px · z∈[-945,-505]mm · 14 of 96 slices shown, 16 images]
[im 4/96  soft-tissue]
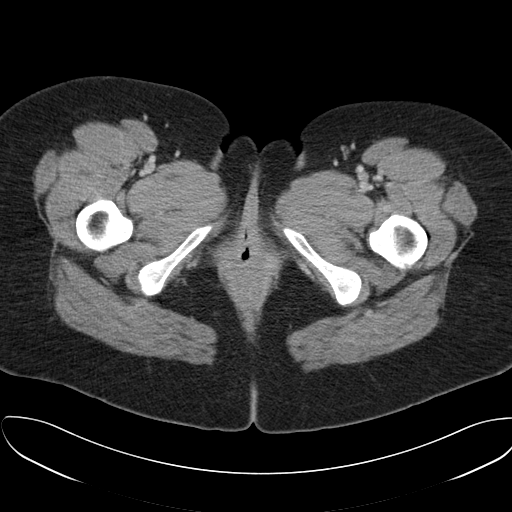
[im 4/96  bone]
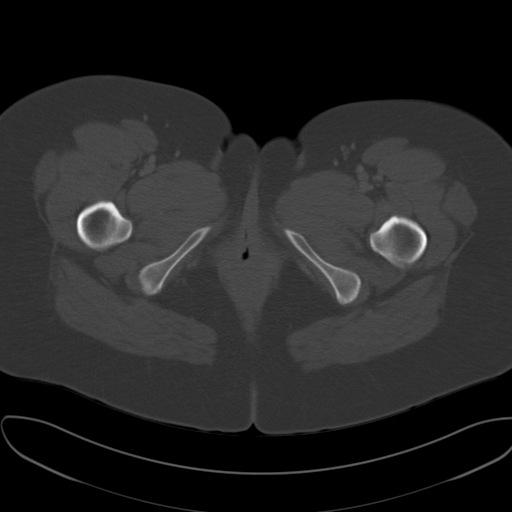
[im 12/96  soft-tissue]
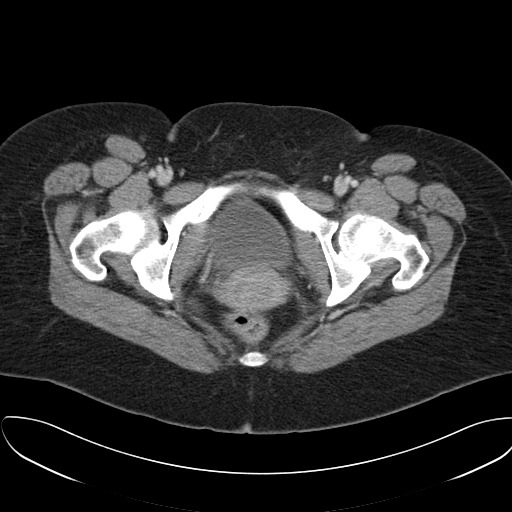
[im 20/96  soft-tissue]
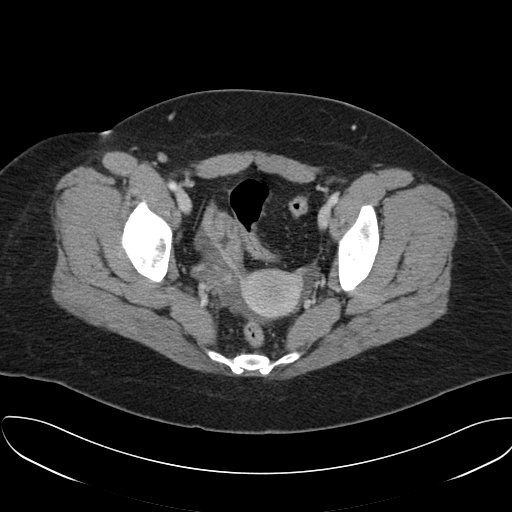
[im 27/96  soft-tissue]
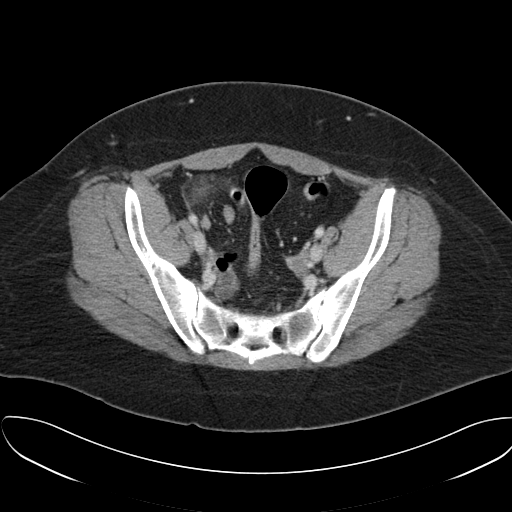
[im 31/96  soft-tissue]
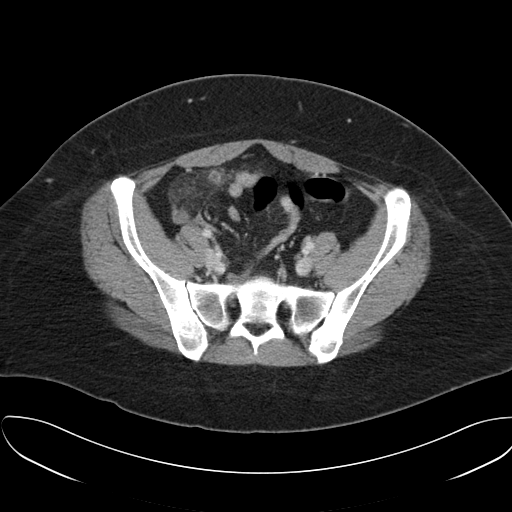
[im 39/96  soft-tissue]
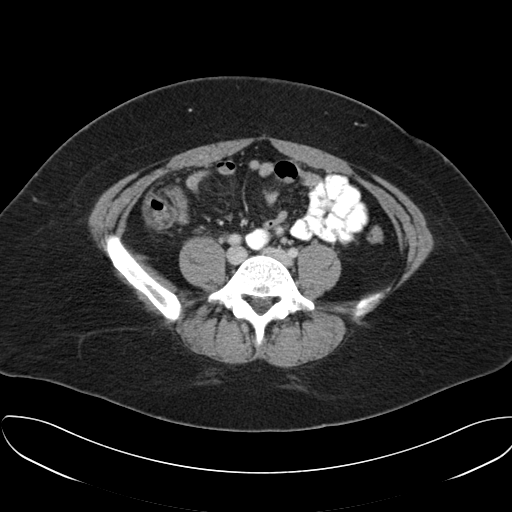
[im 46/96  soft-tissue]
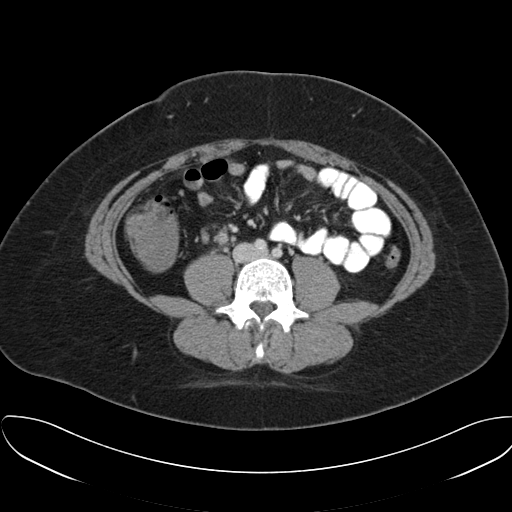
[im 50/96  soft-tissue]
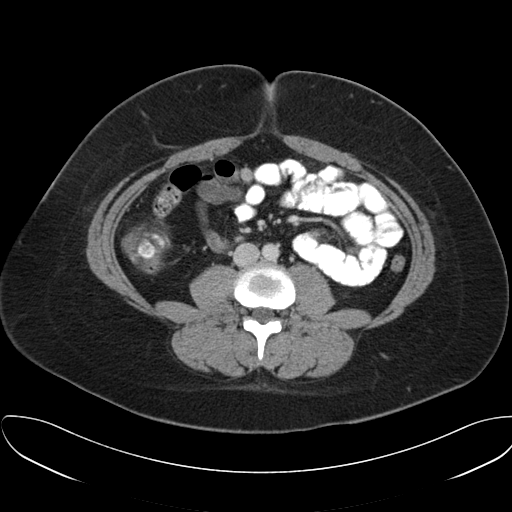
[im 58/96  soft-tissue]
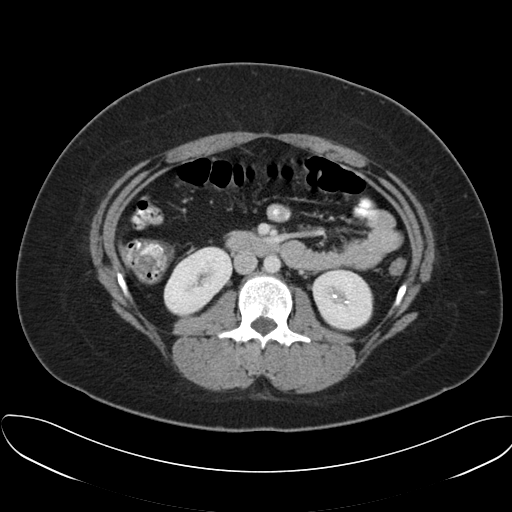
[im 58/96  bone]
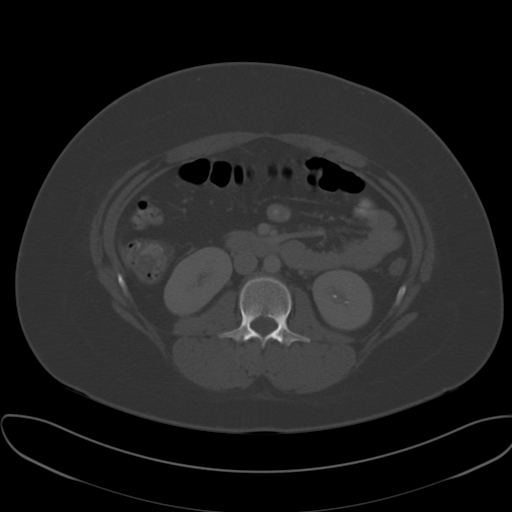
[im 65/96  soft-tissue]
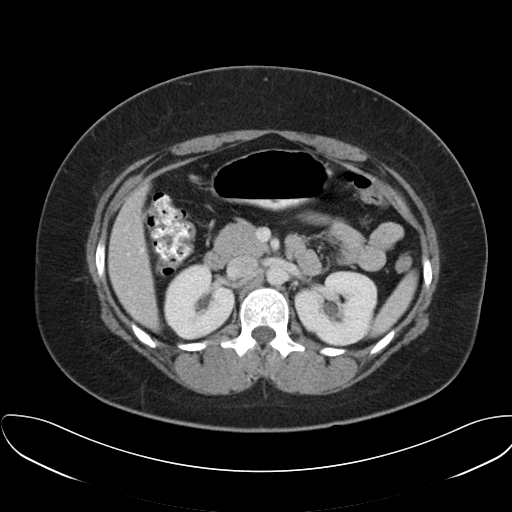
[im 73/96  soft-tissue]
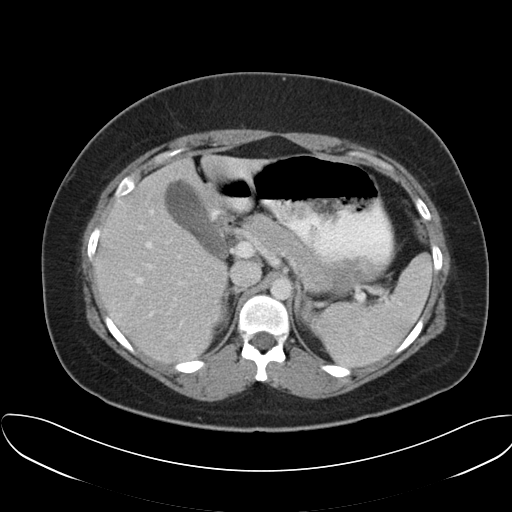
[im 77/96  soft-tissue]
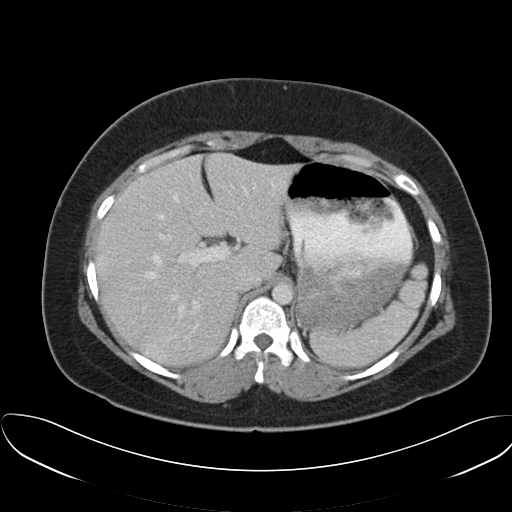
[im 84/96  soft-tissue]
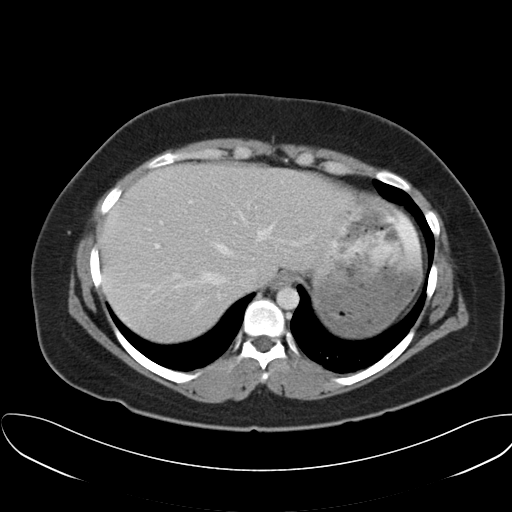
[im 92/96  soft-tissue]
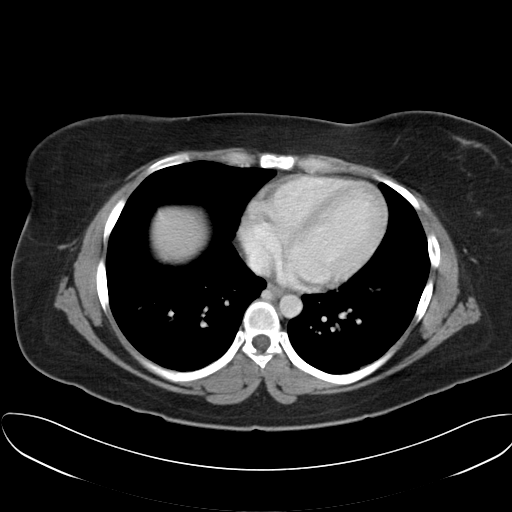

[Series 5: cor routine abd pel with · coronal · 0.97mm/px · 3 of 126 slices shown]
[im 42/126  soft-tissue]
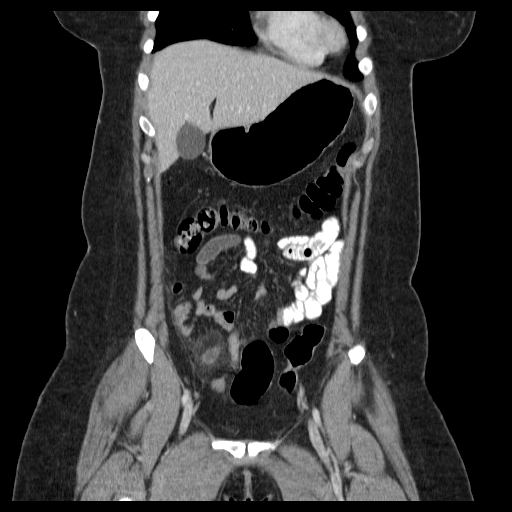
[im 56/126  soft-tissue]
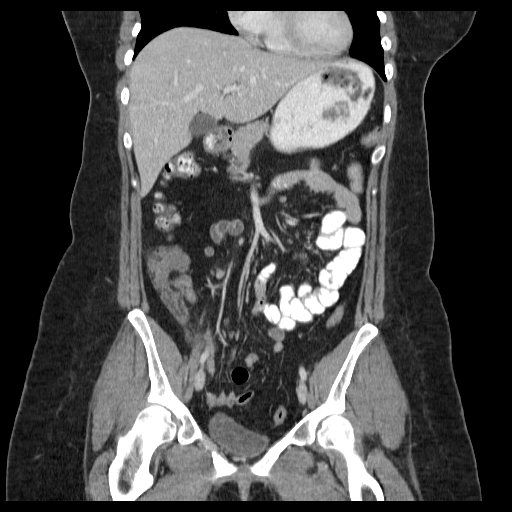
[im 70/126  soft-tissue]
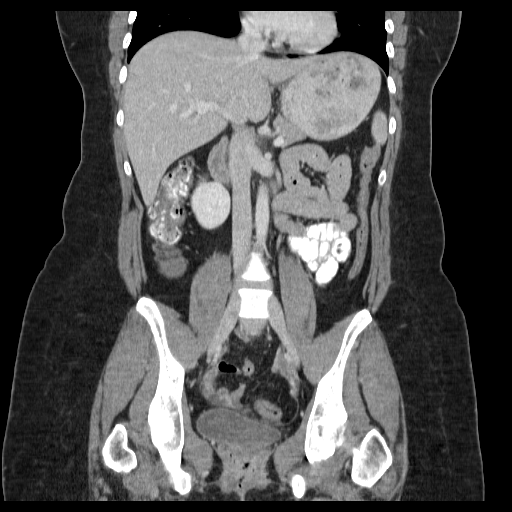

[17 of 46 positions shown; findings below may reference images not displayed]

FINDINGS: There is acute inflammation surrounding the enlarged appendix. The
findings are typical of acute appendicitis. There are appendicoliths
within the lumen. There is no abscess. There is no extraluminal air.

There are normal appearances of the liver, spleen, pancreas,
adrenals and right kidney. There are several collecting system
calculi in the left kidney measuring up to 5 mm. There is no bowel
obstruction. There is no adenopathy. There is a small volume free
pelvic fluid in the cul-de-sac. No significant abnormality is
evident in the lower chest. No significant musculoskeletal
abnormality is evident.
IMPRESSION: Acute appendicitis

Left nephrolithiasis

These results were called by telephone at the time of interpretation
on 07/20/2014 at [DATE] to Dr. MATHIEU SOM , who verbally
acknowledged these results.

## 2015-07-30 ENCOUNTER — Ambulatory Visit (INDEPENDENT_AMBULATORY_CARE_PROVIDER_SITE_OTHER): Payer: 59 | Admitting: Family Medicine

## 2015-07-30 ENCOUNTER — Encounter: Payer: Self-pay | Admitting: Family Medicine

## 2015-07-30 VITALS — BP 110/68 | HR 104 | Temp 98.6°F | Ht 61.0 in | Wt 205.5 lb

## 2015-07-30 DIAGNOSIS — J209 Acute bronchitis, unspecified: Secondary | ICD-10-CM | POA: Insufficient documentation

## 2015-07-30 MED ORDER — HYDROCODONE-HOMATROPINE 5-1.5 MG/5ML PO SYRP
5.0000 mL | ORAL_SOLUTION | Freq: Three times a day (TID) | ORAL | Status: DC | PRN
Start: 2015-07-30 — End: 2016-02-28

## 2015-07-30 MED ORDER — ALBUTEROL SULFATE HFA 108 (90 BASE) MCG/ACT IN AERS: 2.0000 | INHALATION_SPRAY | RESPIRATORY_TRACT | Status: DC | PRN

## 2015-07-30 MED ORDER — PREDNISONE 10 MG PO TABS: ORAL_TABLET | ORAL | Status: DC

## 2015-07-30 NOTE — Progress Notes (Signed)
Pre visit review using our clinic review tool, if applicable. No additional management support is needed unless otherwise documented below in the visit note. 

## 2015-07-30 NOTE — Assessment & Plan Note (Signed)
In former smoker with reactive airways - s/p uri and sinusitis (tx with zpak) Prednisone taper 30 mg -disc side eff Refill albuterol mdi Hycodan for cough with caution of sedation   Update if not starting to improve in a week or if worsening

## 2015-07-30 NOTE — Patient Instructions (Signed)
I think you have a cyclic cough and bronchitis (after your upper respiratory infection and sinus infection) Take the prednisone as directed Use inhaler as needed Hycodan when not working or driving Extra fluids and rest   Update if not starting to improve in a week or if worsening  -especially if short of breath or fever

## 2015-07-30 NOTE — Progress Notes (Signed)
Subjective:    Patient ID: Yvonne Trujillo, female    DOB: [birth year], 30 y.o.   MRN: [id number]  HPI Here for uri symptoms since Thursday   No fever   Nasal - stuffy and runny - really congested when she lies down - more clear than yellow Face is not hurting   Deep cough - phlegm is a little yellow  Tight and wheezy -(still not smoking) Pro air helps a little bit   A little nausea -poss from drainage    Had sinusitis 2 wk ago- took a z pack   Has taken allergy med flonase Sudafed  Robitussin  Pro air inhaler   Patient Active Problem List   Diagnosis Date Noted  . Acute bronchitis 02/13/2015  . Herpes simplex 01/18/2015  . Encounter for routine gynecological examination 06/12/2013  . Screen for STD (sexually transmitted disease) 06/12/2013  . Leukocytosis, unspecified 06/12/2013  . Hyperlipidemia 04/25/2013  . Amenorrhea 04/24/2013  . Migraine 04/24/2013  . Depression with anxiety 04/24/2013  . Hirsutism 04/24/2013  . Acne 04/24/2013  . History of kidney stones 04/24/2013  . Screening for diabetes mellitus 04/24/2013  . Routine general medical examination at a health care facility 04/24/2013   Past Medical History  Diagnosis Date  . Migraine   . Kidney stone   . Kidney infection   . Concussion   . History of depression    Past Surgical History  Procedure Laterality Date  . Appendectomy     Social History  Substance Use Topics  . Smoking status: Former Smoker -- 6 years    Types: Cigarettes  . Smokeless tobacco: Never Used  . Alcohol Use: 0.0 oz/week    0 Standard drinks or equivalent per week     Comment: socially   Family History  Problem Relation Age of Onset  . Alcohol abuse Maternal Uncle   . Hyperlipidemia Mother   . Hyperlipidemia Father   . Hypertension Father   . Hypertension Mother   . Heart disease Mother   . Diabetes Cousin    No Known Allergies Current Outpatient Prescriptions on File Prior to Visit  Medication Sig Dispense Refill   . albuterol (PROVENTIL HFA;VENTOLIN HFA) 108 (90 BASE) MCG/ACT inhaler Inhale 2 puffs into the lungs every 4 (four) hours as needed for wheezing or shortness of breath. 1 Inhaler 3  . ALPRAZolam (XANAX) 0.5 MG tablet TAKE 1 TABLET BY MOUTH TWICE DAILY AS NEEDED FOR ANXIETY/SLEEP 30 tablet 0  . ibuprofen (ADVIL,MOTRIN) 200 MG tablet Take 200 mg by mouth every 6 (six) hours as needed for moderate pain.     . OCELLA 3-0.03 MG tablet TAKE 1 TABLET BY MOUTH DAILY. 28 tablet 5  . ondansetron (ZOFRAN) 4 MG tablet TAKE 1 TABLET (4 MG TOTAL) BY MOUTH EVERY 8 (EIGHT) HOURS AS NEEDED FOR NAUSEA OR VOMITING. 90 tablet 0  . SUMAtriptan (IMITREX) 100 MG tablet Take 1 tablet by mouth at onset of migraine. May repeat in 2 hours if no improvement. Do not exceed 2 tablets in 24 hours. 10 tablet 3  . topiramate (TOPAMAX) 50 MG tablet Take 1 tablet by mouth every evening for migraine prevention. 30 tablet 3  . valACYclovir (VALTREX) 1000 MG tablet TAKE 2 TABLETS BY MOUTH 2 TIMES A DAY FOR 1 DAY 20 tablet 0   No current facility-administered medications on file prior to visit.    Review of Systems  Constitutional: Positive for appetite change and fatigue. Negative for fever.  HENT: Positive  for congestion, postnasal drip, rhinorrhea, sinus pressure, sneezing and sore throat. Negative for ear pain.   Eyes: Negative for pain and discharge.  Respiratory: Positive for cough. Negative for shortness of breath, wheezing and stridor.   Cardiovascular: Negative for chest pain.  Gastrointestinal: Negative for nausea, vomiting and diarrhea.  Genitourinary: Negative for urgency, frequency and hematuria.  Musculoskeletal: Negative for myalgias and arthralgias.  Skin: Negative for rash.  Neurological: Positive for headaches. Negative for dizziness, weakness and light-headedness.  Psychiatric/Behavioral: Negative for confusion and dysphoric mood.       Objective:   Physical Exam  Constitutional: She appears well-developed  and well-nourished. No distress.  HENT:  Head: Normocephalic and atraumatic.  Right Ear: External ear normal.  Left Ear: External ear normal.  Mouth/Throat: Oropharynx is clear and moist.  Nares are injected and congested  No sinus tenderness Clear rhinorrhea and post nasal drip   Eyes: Conjunctivae and EOM are normal. Pupils are equal, round, and reactive to light. Right eye exhibits no discharge. Left eye exhibits no discharge.  Neck: Normal range of motion. Neck supple.  Cardiovascular: Normal rate and normal heart sounds.   Pulmonary/Chest: Effort normal. No respiratory distress. She has wheezes. She has no rales. She exhibits no tenderness.  Wheeze on forced expiration  Harsh bs , some scattered rhonchi (no rales) Overall good air exch    Lymphadenopathy:    She has no cervical adenopathy.  Neurological: She is alert.  Skin: Skin is warm and dry. No rash noted.  Psychiatric: She has a normal mood and affect.          Assessment & Plan:   Problem List Items Addressed This Visit      Respiratory   Acute bronchitis - Primary    In former smoker with reactive airways - s/p uri and sinusitis (tx with zpak) Prednisone taper 30 mg -disc side eff Refill albuterol mdi Hycodan for cough with caution of sedation   Update if not starting to improve in a week or if worsening

## 2015-08-13 ENCOUNTER — Other Ambulatory Visit: Payer: Self-pay | Admitting: Family Medicine

## 2015-08-13 NOTE — Telephone Encounter (Signed)
Please refill 10 pills times 3  Thanks

## 2015-08-13 NOTE — Telephone Encounter (Signed)
Pt had CPE with [facility] on 01/18/15, last refilled on 01/18/15 #0 tabs with 3 additional refills, please advise

## 2015-08-14 NOTE — Telephone Encounter (Signed)
done

## 2015-09-11 ENCOUNTER — Other Ambulatory Visit: Payer: Self-pay | Admitting: Family Medicine

## 2015-09-12 NOTE — Telephone Encounter (Signed)
Pt has had a few recent acute appts but last CPE was 01/18/15, last refilled on 06/03/15 #30 with 0 refills, please advise

## 2015-09-12 NOTE — Telephone Encounter (Signed)
Px written for call in   

## 2015-09-12 NOTE — Telephone Encounter (Signed)
Rx called in as prescribed 

## 2015-10-21 ENCOUNTER — Emergency Department: Payer: 59

## 2015-10-21 ENCOUNTER — Encounter: Payer: Self-pay | Admitting: Emergency Medicine

## 2015-10-21 ENCOUNTER — Emergency Department
Admission: EM | Admit: 2015-10-21 | Discharge: 2015-10-21 | Disposition: A | Discharge status: Home / Self Care | Payer: 59 | Attending: Emergency Medicine | Admitting: Emergency Medicine

## 2015-10-21 DIAGNOSIS — Z79899 Other long term (current) drug therapy: Secondary | ICD-10-CM | POA: Insufficient documentation

## 2015-10-21 DIAGNOSIS — F329 Major depressive disorder, single episode, unspecified: Secondary | ICD-10-CM | POA: Insufficient documentation

## 2015-10-21 DIAGNOSIS — Z87891 Personal history of nicotine dependence: Secondary | ICD-10-CM | POA: Insufficient documentation

## 2015-10-21 DIAGNOSIS — R109 Unspecified abdominal pain: Secondary | ICD-10-CM | POA: Diagnosis present

## 2015-10-21 DIAGNOSIS — N200 Calculus of kidney: Secondary | ICD-10-CM | POA: Diagnosis not present

## 2015-10-21 DIAGNOSIS — E785 Hyperlipidemia, unspecified: Secondary | ICD-10-CM | POA: Diagnosis not present

## 2015-10-21 LAB — URINALYSIS COMPLETE WITH MICROSCOPIC (ARMC ONLY)
BILIRUBIN URINE: NEGATIVE
Bacteria, UA: NONE SEEN
Glucose, UA: NEGATIVE mg/dL
KETONES UR: NEGATIVE mg/dL
LEUKOCYTES UA: NEGATIVE
Nitrite: NEGATIVE
PH: 7 (ref 5.0–8.0)
Protein, ur: NEGATIVE mg/dL
Specific Gravity, Urine: 1.009 (ref 1.005–1.030)

## 2015-10-21 MED ORDER — CYCLOBENZAPRINE HCL 10 MG PO TABS: 10.0000 mg | ORAL_TABLET | Freq: Three times a day (TID) | ORAL | Status: DC | PRN

## 2015-10-21 MED ORDER — HYDROCODONE-ACETAMINOPHEN 5-325 MG PO TABS: 1.0000 | ORAL_TABLET | Freq: Four times a day (QID) | ORAL | Status: DC | PRN

## 2015-10-21 MED ORDER — HYDROCODONE-ACETAMINOPHEN 5-325 MG PO TABS
1.0000 | ORAL_TABLET | Freq: Once | ORAL | Status: AC
  Administered 2015-10-21: 1 via ORAL
  Filled 2015-10-21: qty 1

## 2015-10-21 NOTE — ED Notes (Signed)
poc preg negative.

## 2015-10-21 NOTE — ED Notes (Signed)
Pt with low back pain started yesterday and has blood in her urine. Pt with hx of kidney infections.

## 2015-10-21 NOTE — ED Provider Notes (Signed)
[facility] Emergency Department Provider Note  ____________________________________________  Time seen: Approximately 12:42 PM  I have reviewed the triage vital signs and the nursing notes.   HISTORY  Chief Complaint Back Pain    HPI Yvonne Trujillo is a 30 y.o. female , NAD, presents to the emergency department with 2 day history of lower back pain and increased urinary frequency. Does feel the lower back pain has wrapped around to the flank on the left. Did note some blood in her urine over the last 24 hours. Has a history of kidney infections as well as kidney stones in the past. Has not had a renal stone in 2-3 years. Also notes some lower extremity swelling in which she can see the outline of her socks about her ankles. Has not noted any redness or warmth. Denies any fevers, chills, body aches. Has not had any nausea, vomiting, diarrhea.   Past Medical History  Diagnosis Date  . Migraine   . Kidney stone   . Kidney infection   . Concussion   . History of depression     Patient Active Problem List   Diagnosis Date Noted  . Acute bronchitis 07/30/2015  . Herpes simplex 01/18/2015  . Encounter for routine gynecological examination 06/12/2013  . Screen for STD (sexually transmitted disease) 06/12/2013  . Leukocytosis, unspecified 06/12/2013  . Hyperlipidemia 04/25/2013  . Amenorrhea 04/24/2013  . Migraine 04/24/2013  . Depression with anxiety 04/24/2013  . Hirsutism 04/24/2013  . Acne 04/24/2013  . History of kidney stones 04/24/2013  . Screening for diabetes mellitus 04/24/2013  . Routine general medical examination at a health care facility 04/24/2013    Past Surgical History  Procedure Laterality Date  . Appendectomy      Current Outpatient Rx  Name  Route  Sig  Dispense  Refill  . OCELLA 3-0.03 MG tablet      TAKE 1 TABLET BY MOUTH DAILY.   28 tablet   5   . albuterol (PROVENTIL HFA;VENTOLIN HFA) 108 (90 Base) MCG/ACT inhaler    Inhalation   Inhale 2 puffs into the lungs every 4 (four) hours as needed for wheezing or shortness of breath.   1 Inhaler   3   . ALPRAZolam (XANAX) 0.5 MG tablet      TAKE ONE TABLET TWICE A DAY IF NEEDED FOR ANXIETY OR SLEEP   30 tablet   0   . cyclobenzaprine (FLEXERIL) 10 MG tablet   Oral   Take 1 tablet (10 mg total) by mouth 3 (three) times daily as needed for muscle spasms.   21 tablet   0   . HYDROcodone-acetaminophen (NORCO) 5-325 MG tablet   Oral   Take 1-2 tablets by mouth every 6 (six) hours as needed for severe pain.   20 tablet   0   . HYDROcodone-homatropine (HYCODAN) 5-1.5 MG/5ML syrup   Oral   Take 5 mLs by mouth every 8 (eight) hours as needed for cough (when not working or driving).   120 mL   0   . ibuprofen (ADVIL,MOTRIN) 200 MG tablet   Oral   Take 200 mg by mouth every 6 (six) hours as needed for moderate pain.          Marland Kitchen ondansetron (ZOFRAN) 4 MG tablet      TAKE 1 TABLET (4 MG TOTAL) BY MOUTH EVERY 8 (EIGHT) HOURS AS NEEDED FOR NAUSEA OR VOMITING.   90 tablet   0   . predniSONE (DELTASONE)  10 MG tablet      Take 3 pills once daily by mouth for 3 days, then 2 pills once daily for 3 days, then 1 pill once daily for 3 days and then stop   18 tablet   0   . SUMAtriptan (IMITREX) 100 MG tablet      TAKE 1 TABLET BY MOUTH EVERY TWO HOURS AS NEEDED FOR MIGRAINE   10 tablet   3   . topiramate (TOPAMAX) 50 MG tablet      Take 1 tablet by mouth every evening for migraine prevention.   30 tablet   3   . valACYclovir (VALTREX) 1000 MG tablet      TAKE 2 TABLETS BY MOUTH 2 TIMES A DAY FOR 1 DAY   20 tablet   0     Allergies Review of patient's allergies indicates no known allergies.  Family History  Problem Relation Age of Onset  . Alcohol abuse Maternal Uncle   . Hyperlipidemia Mother   . Hyperlipidemia Father   . Hypertension Father   . Hypertension Mother   . Heart disease Mother   . Diabetes Cousin     Social  History Social History  Substance Use Topics  . Smoking status: Former Smoker -- 6 years    Types: Cigarettes  . Smokeless tobacco: Never Used  . Alcohol Use: 0.0 oz/week    0 Standard drinks or equivalent per week     Comment: socially     Review of Systems  Constitutional: No fever/chills, fatigue Cardiovascular: No chest pain. Respiratory: No shortness of breath.  Gastrointestinal: Positive abdominal pain.  No nausea, vomiting.  No diarrhea.  No constipation. Genitourinary: Positive for dysuria, hematuria, increased frequency. No urinary hesitancy, urgency. Musculoskeletal: Positive for back pain.  Skin: Negative for rash. Neurological: Negative for headaches, focal weakness or numbness. No saddle paresthesias. 10-point ROS otherwise negative.  ____________________________________________   PHYSICAL EXAM:  VITAL SIGNS: ED Triage Vitals  Enc Vitals Group     BP 10/21/15 1112 127/90 mmHg     Pulse Rate 10/21/15 1112 102     Resp 10/21/15 1112 20     Temp 10/21/15 1112 98.5 F (36.9 C)     Temp Source 10/21/15 1112 Oral     SpO2 10/21/15 1112 98 %     Weight 10/21/15 1131 205 lb (92.987 kg)     Height --      Head Cir --      Peak Flow --      Pain Score 10/21/15 1113 10     Pain Loc --      Pain Edu? --      Excl. in GC? --     Constitutional: Alert and oriented. Well appearing and in no acute distress. Eyes: Conjunctivae are normal. PERRL. EOMI without pain.  Head: Atraumatic. Cardiovascular: Normal rate, regular rhythm. Normal S1 and S2.  Good peripheral circulation with 2+ pulses noted in bilateral lower extremities. Respiratory: Normal respiratory effort without tachypnea or retractions. Lungs CTAB with breath sounds noted in all lung fields. Gastrointestinal: Mild suprapubic tenderness to deep palpation noted but area is nondistended and without guarding. All other quadrants are soft and nontender without distention or guarding. Positive left CVA  tenderness. Musculoskeletal: No lower extremity tenderness. Mild swelling is noted about bilateral lower extremities with indention of sock line but no pitting.  No joint effusions. Neurologic:  Normal speech and language. No gross focal neurologic deficits are appreciated.  Skin:  Skin  is warm, dry and intact. No rash noted. Psychiatric: Mood and affect are normal. Speech and behavior are normal. Patient exhibits appropriate insight and judgement.   ____________________________________________   LABS (all labs ordered are listed, but only abnormal results are displayed)  Labs Reviewed  URINALYSIS COMPLETEWITH MICROSCOPIC ([facility] ONLY) - Abnormal; Notable for the following:    Color, Urine STRAW (*)    APPearance CLEAR (*)    Hgb urine dipstick 2+ (*)    Squamous Epithelial / LPF 0-5 (*)    All other components within normal limits  POC URINE PREG, ED   ____________________________________________  EKG  None ____________________________________________  RADIOLOGY I have personally viewed and evaluated these images (plain radiographs) as part of my medical decision making, as well as reviewing the written report by the radiologist.  Ct Renal Stone Study  10/21/2015  CLINICAL DATA:  Left flank pain for 2 days. Right lower pelvic pain today. Gross hematuria. History of renal calculi. EXAM: CT ABDOMEN AND PELVIS WITHOUT CONTRAST TECHNIQUE: Multidetector CT imaging of the abdomen and pelvis was performed following the standard protocol without IV contrast. COMPARISON:  06/20/2012 FINDINGS: Lower chest:  Unremarkable Hepatobiliary: Contracted gallbladder.  Otherwise unremarkable Pancreas: Unremarkable Spleen: Unremarkable Adrenals/Urinary Tract: Adrenal glands normal. No right-sided calculi observed. However, there multiple left-sided renal calculi including a cluster in knee left UPJ measuring 0.9 by 0.8 by 0.6 cm (image 35/2), a 4 mm lower pole calculus, and a 4 by 3 mm lower pole  calculus. Currently there is no overt hydroureter or hydronephrosis. Urinary bladder unremarkable. Stomach/Bowel: Appendectomy. Vascular/Lymphatic: Unremarkable Reproductive: Unremarkable Other: No supplemental non-categorized findings. Musculoskeletal: Small umbilical hernia contains adipose tissue. IMPRESSION: 1. Cluster of calculi measuring 9 by 8 by 6 mm in the left UPJ. This is not currently obstructive, but could possibly be intermittently obstructing. There also several nonobstructive left kidney lower pole calculi. 2. Small umbilical hernia contains adipose tissue. Electronically Signed   By: Gaylyn RongWalter  Liebkemann M.D.   On: 10/21/2015 14:38    ____________________________________________    PROCEDURES  Procedure(s) performed: None    Medications  HYDROcodone-acetaminophen (NORCO/VICODIN) 5-325 MG per tablet 1 tablet (1 tablet Oral Given 10/21/15 1253)     ____________________________________________   INITIAL IMPRESSION / ASSESSMENT AND PLAN / ED COURSE  Pertinent labs & imaging results that were available during my care of the patient were reviewed by me and considered in my medical decision making (see chart for details).  Patient's diagnosis is consistent with Nephrolithiasis. All lab results and imaging results were discussed with the patient. At this time there are no obstructive calculi. No evidence of infection on urinalysis. Patient will be discharged home with prescriptions for Norco and Flexeril to take as directed. Patient is to follow up with nephrology in 2-3 days. Patient verbalizes understanding that if she has any worsening or onset of new symptoms she is to return to this emergency department immediately for further evaluation and treatment.      ____________________________________________  FINAL CLINICAL IMPRESSION(S) / ED DIAGNOSES  Final diagnoses:  Nephrolithiasis      NEW MEDICATIONS STARTED DURING THIS VISIT:  Discharge Medication List as of  10/21/2015  2:54 PM    START taking these medications   Details  cyclobenzaprine (FLEXERIL) 10 MG tablet Take 1 tablet (10 mg total) by mouth 3 (three) times daily as needed for muscle spasms., Starting 10/21/2015, Until Discontinued, Print    HYDROcodone-acetaminophen (NORCO) 5-325 MG tablet Take 1-2 tablets by mouth every 6 (six) hours as  needed for severe pain., Starting 10/21/2015, Until Discontinued, Print             Hope Pigeon, PA-C 10/21/15 1625  Myrna Blazer, MD 10/22/15 [phone]

## 2015-10-21 NOTE — ED Notes (Signed)
States she is having lower back pain with urinary freq for couple of days

## 2015-10-21 NOTE — Discharge Instructions (Signed)
Kidney Stones Kidney stones (urolithiasis) are deposits that form inside your kidneys. The intense pain is caused by the stone moving through the urinary tract. When the stone moves, the ureter goes into spasm around the stone. The stone is usually passed in the urine.  CAUSES   A disorder that makes certain neck glands produce too much parathyroid hormone (primary hyperparathyroidism).  A buildup of uric acid crystals, similar to gout in your joints.  Narrowing (stricture) of the ureter.  A kidney obstruction present at birth (congenital obstruction).  Previous surgery on the kidney or ureters.  Numerous kidney infections. SYMPTOMS   Feeling sick to your stomach (nauseous).  Throwing up (vomiting).  Blood in the urine (hematuria).  Pain that usually spreads (radiates) to the groin.  Frequency or urgency of urination. DIAGNOSIS   Taking a history and physical exam.  Blood or urine tests.  CT scan.  Occasionally, an examination of the inside of the urinary bladder (cystoscopy) is performed. TREATMENT   Observation.  Increasing your fluid intake.  Extracorporeal shock wave lithotripsy--This is a noninvasive procedure that uses shock waves to break up kidney stones.  Surgery may be needed if you have severe pain or persistent obstruction. There are various surgical procedures. Most of the procedures are performed with the use of small instruments. Only small incisions are needed to accommodate these instruments, so recovery time is minimized. The size, location, and chemical composition are all important variables that will determine the proper choice of action for you. Talk to your health care provider to better understand your situation so that you will minimize the risk of injury to yourself and your kidney.  HOME CARE INSTRUCTIONS   Drink enough water and fluids to keep your urine clear or pale yellow. This will help you to pass the stone or stone fragments.  Strain  all urine through the provided strainer. Keep all particulate matter and stones for your health care provider to see. The stone causing the pain may be as small as a grain of salt. It is very important to use the strainer each and every time you pass your urine. The collection of your stone will allow your health care provider to analyze it and verify that a stone has actually passed. The stone analysis will often identify what you can do to reduce the incidence of recurrences.  Only take over-the-counter or prescription medicines for pain, discomfort, or fever as directed by your health care provider.  Keep all follow-up visits as told by your health care provider. This is important.  Get follow-up X-rays if required. The absence of pain does not always mean that the stone has passed. It may have only stopped moving. If the urine remains completely obstructed, it can cause loss of kidney function or even complete destruction of the kidney. It is your responsibility to make sure X-rays and follow-ups are completed. Ultrasounds of the kidney can show blockages and the status of the kidney. Ultrasounds are not associated with any radiation and can be performed easily in a matter of minutes.  Make changes to your daily diet as told by your health care provider. You may be told to:  Limit the amount of salt that you eat.  Eat 5 or more servings of fruits and vegetables each day.  Limit the amount of meat, poultry, fish, and eggs that you eat.  Collect a 24-hour urine sample as told by your health care provider.You may need to collect another urine sample every 6-12   months. SEEK MEDICAL CARE IF:  You experience pain that is progressive and unresponsive to any pain medicine you have been prescribed. SEEK IMMEDIATE MEDICAL CARE IF:   Pain cannot be controlled with the prescribed medicine.  You have a fever or shaking chills.  The severity or intensity of pain increases over 18 hours and is not  relieved by pain medicine.  You develop a new onset of abdominal pain.  You feel faint or pass out.  You are unable to urinate.   This information is not intended to replace advice given to you by your health care provider. Make sure you discuss any questions you have with your health care provider.   Document Released: 04/20/2005 Document Revised: 01/09/2015 Document Reviewed: 09/21/2012 Elsevier Interactive Patient Education 2016 [employer] Dietary Guidelines to Help Prevent Kidney Stones Your risk of kidney stones can be decreased by adjusting the foods you eat. The most important thing you can do is drink enough fluid. You should drink enough fluid to keep your urine clear or pale yellow. The following guidelines provide specific information for the type of kidney stone you have had. GUIDELINES ACCORDING TO TYPE OF KIDNEY STONE Calcium Oxalate Kidney Stones  Reduce the amount of salt you eat. Foods that have a lot of salt cause your body to release excess calcium into your urine. The excess calcium can combine with a substance called oxalate to form kidney stones.  Reduce the amount of animal protein you eat if the amount you eat is excessive. Animal protein causes your body to release excess calcium into your urine. Ask your dietitian how much protein from animal sources you should be eating.  Avoid foods that are high in oxalates. If you take vitamins, they should have less than 500 mg of vitamin C. Your body turns vitamin C into oxalates. You do not need to avoid fruits and vegetables high in vitamin C. Calcium Phosphate Kidney Stones  Reduce the amount of salt you eat to help prevent the release of excess calcium into your urine.  Reduce the amount of animal protein you eat if the amount you eat is excessive. Animal protein causes your body to release excess calcium into your urine. Ask your dietitian how much protein from animal sources you should be eating.  Get enough  calcium from food or take a calcium supplement (ask your dietitian for recommendations). Food sources of calcium that do not increase your risk of kidney stones include:  Broccoli.  Dairy products, such as cheese and yogurt.  Pudding. Uric Acid Kidney Stones  Do not have more than 6 oz of animal protein per day. FOOD SOURCES Animal Protein Sources  Meat (all types).  Poultry.  Eggs.  Fish, seafood. Foods High in Salt  Salt seasonings.  Soy sauce.  Teriyaki sauce.  Cured and processed meats.  Salted crackers and snack foods.  Fast food.  Canned soups and most canned foods. Foods High in Oxalates  Grains:  Amaranth.  Barley.  Grits.  Wheat germ.  Bran.  Buckwheat flour.  All bran cereals.  Pretzels.  Whole wheat bread.  Vegetables:  Beans (wax).  Beets and beet greens.  Collard greens.  Eggplant.  Escarole.  Leeks.  Okra.  Parsley.  Rutabagas.  Spinach.  Swiss chard.  Tomato paste.  Fried potatoes.  Sweet potatoes.  Fruits:  Red currants.  Figs.  Kiwi.  Rhubarb.  Meat and Other Protein Sources:  Beans (dried).  Soy burgers and other soybean products.  Miso.    Nuts (peanuts, almonds, pecans, cashews, hazelnuts).  Nut butters.  Sesame seeds and tahini (paste made of sesame seeds).  Poppy seeds.  Beverages:  Chocolate drink mixes.  Soy milk.  Instant iced tea.  Juices made from high-oxalate fruits or vegetables.  Other:  Carob.  Chocolate.  Fruitcake.  Marmalades.   This information is not intended to replace advice given to you by your health care provider. Make sure you discuss any questions you have with your health care provider.   Document Released: 08/15/2010 Document Revised: 04/25/2013 Document Reviewed: 03/17/2013 Elsevier Interactive Patient Education 2016 [employer] Inc[employer]  

## 2015-11-14 ENCOUNTER — Other Ambulatory Visit: Payer: Self-pay | Admitting: *Deleted

## 2015-11-14 MED ORDER — ALPRAZOLAM 0.5 MG PO TABS: ORAL_TABLET | ORAL | Status: DC

## 2015-11-14 NOTE — Telephone Encounter (Signed)
Rx called in as prescribed 

## 2015-11-14 NOTE — Telephone Encounter (Signed)
Px written for call in   

## 2015-11-14 NOTE — Telephone Encounter (Signed)
Fax refill request, last filled on 09/12/15 #30 tabs with 0 refills, pt had an acute appt in March but no recent/future f/u or CPE appt., please advise

## 2015-11-18 ENCOUNTER — Other Ambulatory Visit: Payer: Self-pay | Admitting: Primary Care

## 2015-11-18 NOTE — Telephone Encounter (Signed)
Electronically refill request for ondansetron (ZOFRAN) 4 MG tablet. Last refilled and seen by Jae DireKate on 01/18/2015. No future appointment.

## 2015-11-18 NOTE — Telephone Encounter (Signed)
Please refill times one  She takes it for migrane n/v

## 2015-11-19 NOTE — Telephone Encounter (Signed)
Refills done as PCP instructed.

## 2016-01-07 ENCOUNTER — Other Ambulatory Visit: Payer: Self-pay | Admitting: Family Medicine

## 2016-01-08 NOTE — Telephone Encounter (Signed)
done

## 2016-01-08 NOTE — Telephone Encounter (Signed)
Please refill times 5 

## 2016-01-08 NOTE — Telephone Encounter (Signed)
Last OV was 07/30/15, last filled on 08/14/15 #10 tabs with 3 additional refills, please advise

## 2016-02-28 ENCOUNTER — Encounter: Payer: Self-pay | Admitting: Primary Care

## 2016-02-28 ENCOUNTER — Ambulatory Visit (INDEPENDENT_AMBULATORY_CARE_PROVIDER_SITE_OTHER): Payer: 59 | Admitting: Primary Care

## 2016-02-28 VITALS — BP 118/84 | HR 101 | Temp 98.9°F | Ht 61.0 in | Wt 210.8 lb

## 2016-02-28 DIAGNOSIS — Z0000 Encounter for general adult medical examination without abnormal findings: Secondary | ICD-10-CM | POA: Diagnosis not present

## 2016-02-28 DIAGNOSIS — E785 Hyperlipidemia, unspecified: Secondary | ICD-10-CM

## 2016-02-28 DIAGNOSIS — F418 Other specified anxiety disorders: Secondary | ICD-10-CM

## 2016-02-28 LAB — COMPREHENSIVE METABOLIC PANEL
ALT: 12 U/L (ref 0–35)
AST: 13 U/L (ref 0–37)
Albumin: 3.5 g/dL (ref 3.5–5.2)
Alkaline Phosphatase: 68 U/L (ref 39–117)
BUN: 13 mg/dL (ref 6–23)
CALCIUM: 8.9 mg/dL (ref 8.4–10.5)
CHLORIDE: 101 meq/L (ref 96–112)
CO2: 25 meq/L (ref 19–32)
CREATININE: 0.75 mg/dL (ref 0.40–1.20)
GFR: 97.59 mL/min (ref >60.00)
Glucose, Bld: 98 mg/dL (ref 70–99)
Potassium: 4.3 mEq/L (ref 3.5–5.1)
Sodium: 133 mEq/L — ABNORMAL LOW (ref 135–145)
Total Bilirubin: 0.3 mg/dL (ref 0.2–1.2)
Total Protein: 7.4 g/dL (ref 6.0–8.3)

## 2016-02-28 LAB — LIPID PANEL
CHOL/HDL RATIO: 4
Cholesterol: 203 mg/dL — ABNORMAL HIGH (ref 0–200)
HDL: 51.8 mg/dL (ref >39.00)
LDL CALC: 121 mg/dL — AB (ref 0–99)
NonHDL: 151.46
TRIGLYCERIDES: 151 mg/dL — AB (ref 0.0–149.0)
VLDL: 30.2 mg/dL (ref 0.0–40.0)

## 2016-02-28 NOTE — Assessment & Plan Note (Signed)
Immunizations UTD. Pap UTD, due in 2018. Discussed the importance of a healthy diet and regular exercise in order for weight loss, and to reduce the risk of other medical diseases. Exam unremarkable. Labs pending Follow up in 1 year for repeat physical.

## 2016-02-28 NOTE — Assessment & Plan Note (Signed)
Using Xanax infrequently, encouraged to use very sparingly for emergencies.

## 2016-02-28 NOTE — Assessment & Plan Note (Signed)
Stopped taking Topamax due to vivid nightmares and no improvement. Overall migraines stable.

## 2016-02-28 NOTE — Progress Notes (Signed)
Pre visit review using our clinic review tool, if applicable. No additional management support is needed unless otherwise documented below in the visit note. 

## 2016-02-28 NOTE — Progress Notes (Signed)
Subjective:    Patient ID: Yvonne Trujillo, female    DOB: 1985/12/16, 30 y.o.   MRN: [id number]  HPI  Yvonne Trujillo is a 30 year old female who presents today for complete physical.  Immunizations: -Tetanus: Completed in 2014 -Influenza: Completed in October 2017   Diet: She endorses a fair diet. Breakfast: Cereal, waffles, biscuit (fast food), egg whites, boiled eggs Lunch: Skips, sandwich, baked chicken Dinner: Baked chicken, pizza, restaurants, some vegetables, rice, pasta Snacks: None Desserts: None Beverages: Water, soda, juice  Exercise: She does not regularly exercise. Eye exam: Completed in 2016, due in 03/2016. Dental exam: Completed three years ago. Pap Smear: Completed in 2015, normal.    Review of Systems  Constitutional: Negative for unexpected weight change.  HENT: Negative for rhinorrhea.   Respiratory: Negative for cough and shortness of breath.        Intermittent shortness of breath during summer months and also during exercise. Also with wheezing. Improved with albuterol.  Cardiovascular: Negative for chest pain.  Gastrointestinal: Negative for constipation and diarrhea.  Genitourinary: Negative for difficulty urinating and menstrual problem.  Musculoskeletal: Negative for arthralgias and myalgias.  Skin: Negative for rash.  Allergic/Immunologic: Positive for environmental allergies.  Neurological: Negative for dizziness, numbness and headaches.  Psychiatric/Behavioral:       Using Xanax infrequently, uses 8 days out of the month.       Past Medical History:  Diagnosis Date  . Concussion   . History of depression   . Kidney infection   . Kidney stone   . Migraine      Social History   Social History  . Marital status: Single    Spouse name: N/A  . Number of children: N/A  . Years of education: N/A   Occupational History  . Not on file.   Social History Main Topics  . Smoking status: Former Smoker    Years: 6.00    Types: Cigarettes  .  Smokeless tobacco: Never Used  . Alcohol use 0.0 oz/week     Comment: socially  . Drug use: No  . Sexual activity: Not on file   Other Topics Concern  . Not on file   Social History Narrative  . No narrative on file    Past Surgical History:  Procedure Laterality Date  . APPENDECTOMY      Family History  Problem Relation Age of Onset  . Alcohol abuse Maternal Uncle   . Hyperlipidemia Mother   . Hyperlipidemia Father   . Hypertension Father   . Hypertension Mother   . Heart disease Mother   . Diabetes Cousin     No Known Allergies  Current Outpatient Prescriptions on File Prior to Visit  Medication Sig Dispense Refill  . albuterol (PROVENTIL HFA;VENTOLIN HFA) 108 (90 Base) MCG/ACT inhaler Inhale 2 puffs into the lungs every 4 (four) hours as needed for wheezing or shortness of breath. 1 Inhaler 3  . ALPRAZolam (XANAX) 0.5 MG tablet TAKE ONE TABLET TWICE A DAY IF NEEDED FOR ANXIETY OR SLEEP 30 tablet 0  . ibuprofen (ADVIL,MOTRIN) 200 MG tablet Take 200 mg by mouth every 6 (six) hours as needed for moderate pain.     . OCELLA 3-0.03 MG tablet TAKE 1 TABLET BY MOUTH DAILY. 28 tablet 5  . ondansetron (ZOFRAN) 4 MG tablet TAKE 1 TABLET BY MOUTH EVERY 8 HOURS AS NEEDED FOR NAUSEA OR VOMITING 90 tablet 0  . SUMAtriptan (IMITREX) 100 MG tablet TAKE 1 TABLET BY MOUTH  EVERY 2 HOURS AS NEEDED FOR MIGRAINE 10 tablet 5  . valACYclovir (VALTREX) 1000 MG tablet TAKE 2 TABLETS BY MOUTH 2 TIMES A DAY FOR 1 DAY 20 tablet 0   No current facility-administered medications on file prior to visit.     BP 118/84   Pulse (!) 101   Temp 98.9 F (37.2 C) (Oral)   Ht 5\' 1"  (1.549 m)   Wt 210 lb 12.8 oz (95.6 kg)   LMP 02/03/2016   SpO2 99%   BMI 39.83 kg/m    Objective:   Physical Exam  Constitutional: She is oriented to person, place, and time. She appears well-nourished.  HENT:  Right Ear: Tympanic membrane and ear canal normal.  Left Ear: Tympanic membrane and ear canal normal.    Nose: Nose normal.  Mouth/Throat: Oropharynx is clear and moist.  Eyes: Conjunctivae and EOM are normal. Pupils are equal, round, and reactive to light.  Neck: Neck supple. No thyromegaly present.  Cardiovascular: Normal rate and regular rhythm.   No murmur heard. Pulmonary/Chest: Effort normal and breath sounds normal. She has no rales.  Abdominal: Soft. Bowel sounds are normal. There is no tenderness.  Musculoskeletal: Normal range of motion.  Lymphadenopathy:    She has no cervical adenopathy.  Neurological: She is alert and oriented to person, place, and time. She has normal reflexes. No cranial nerve deficit.  Skin: Skin is warm and dry. No rash noted.  Psychiatric: She has a normal mood and affect.          Assessment & Plan:

## 2016-02-28 NOTE — Patient Instructions (Signed)
Complete lab work prior to leaving today. I will notify you of your results once received.   You are due for your Pap next year.  Continue to use your albuterol inhaler infrequently as discussed. Please notify Dr. Milinda Antisower if you require use of your inhaler on a daily basis.  I do recommend taking an antihistamine such as Claritin, Zyrtec, Allegra to help with allergy symptoms, especially during summer months.  It's importance to improve your diet by reducing consumption of fast food, fried food, processed snack foods, sugary drinks. Increase consumption of fresh vegetables and fruits, whole grains, water.  Ensure you are drinking 64 ounces of water daily.  Start exercising. You should be getting 150 minutes of moderate intensity exercise weekly.  Consider calorie counting through the use of My Fitness Pal. Choose 1 thing at a time to work on to improve your diet. Make changes gradually so that they are manageable.  Follow up in 1 year for repeat physical or sooner if needed.  It was a pleasure to see you today!  Calorie Counting for Weight Loss Calories are energy you get from the things you eat and drink. Your body uses this energy to keep you going throughout the day. The number of calories you eat affects your weight. When you eat more calories than your body needs, your body stores the extra calories as fat. When you eat fewer calories than your body needs, your body burns fat to get the energy it needs. Calorie counting means keeping track of how many calories you eat and drink each day. If you make sure to eat fewer calories than your body needs, you should lose weight. In order for calorie counting to work, you will need to eat the number of calories that are right for you in a day to lose a healthy amount of weight per week. A healthy amount of weight to lose per week is usually 1-2 lb (0.5-0.9 kg). A dietitian can determine how many calories you need in a day and give you suggestions on  how to reach your calorie goal.  WHAT IS MY MY PLAN? My goal is to have __________ calories per day.  If I have this many calories per day, I should lose around __________ pounds per week. WHAT DO I NEED TO KNOW ABOUT CALORIE COUNTING? In order to meet your daily calorie goal, you will need to:  Find out how many calories are in each food you would like to eat. Try to do this before you eat.  Decide how much of the food you can eat.  Write down what you ate and how many calories it had. Doing this is called keeping a food log. WHERE DO I FIND CALORIE INFORMATION? The number of calories in a food can be found on a Nutrition Facts label. Note that all the information on a label is based on a specific serving of the food. If a food does not have a Nutrition Facts label, try to look up the calories online or ask your dietitian for help. HOW DO I DECIDE HOW MUCH TO EAT? To decide how much of the food you can eat, you will need to consider both the number of calories in one serving and the size of one serving. This information can be found on the Nutrition Facts label. If a food does not have a Nutrition Facts label, look up the information online or ask your dietitian for help. Remember that calories are listed per serving. If  you choose to have more than one serving of a food, you will have to multiply the calories per serving by the amount of servings you plan to eat. For example, the label on a package of bread might say that a serving size is 1 slice and that there are 90 calories in a serving. If you eat 1 slice, you will have eaten 90 calories. If you eat 2 slices, you will have eaten 180 calories. HOW DO I KEEP A FOOD LOG? After each meal, record the following information in your food log:  What you ate.  How much of it you ate.  How many calories it had.  Then, add up your calories. Keep your food log near you, such as in a small notebook in your pocket. Another option is to use a  mobile app or website. Some programs will calculate calories for you and show you how many calories you have left each time you add an item to the log. WHAT ARE SOME CALORIE COUNTING TIPS?  Use your calories on foods and drinks that will fill you up and not leave you hungry. Some examples of this include foods like nuts and nut butters, vegetables, lean proteins, and high-fiber foods (more than 5 g fiber per serving).  Eat nutritious foods and avoid empty calories. Empty calories are calories you get from foods or beverages that do not have many nutrients, such as candy and soda. It is better to have a nutritious high-calorie food (such as an avocado) than a food with few nutrients (such as a bag of chips).  Know how many calories are in the foods you eat most often. This way, you do not have to look up how many calories they have each time you eat them.  Look out for foods that may seem like low-calorie foods but are really high-calorie foods, such as baked goods, soda, and fat-free candy.  Pay attention to calories in drinks. Drinks such as sodas, specialty coffee drinks, alcohol, and juices have a lot of calories yet do not fill you up. Choose low-calorie drinks like water and diet drinks.  Focus your calorie counting efforts on higher calorie items. Logging the calories in a garden salad that contains only vegetables is less important than calculating the calories in a milk shake.  Find a way of tracking calories that works for you. Get creative. Most people who are successful find ways to keep track of how much they eat in a day, even if they do not count every calorie. WHAT ARE SOME PORTION CONTROL TIPS?  Know how many calories are in a serving. This will help you know how many servings of a certain food you can have.  Use a measuring cup to measure serving sizes. This is helpful when you start out. With time, you will be able to estimate serving sizes for some foods.  Take some time to  put servings of different foods on your favorite plates, bowls, and cups so you know what a serving looks like.  Try not to eat straight from a bag or box. Doing this can lead to overeating. Put the amount you would like to eat in a cup or on a plate to make sure you are eating the right portion.  Use smaller plates, glasses, and bowls to prevent overeating. This is a quick and easy way to practice portion control. If your plate is smaller, less food can fit on it.  Try not to multitask while eating,  such as watching TV or using your computer. If it is time to eat, sit down at a table and enjoy your food. Doing this will help you to start recognizing when you are full. It will also make you more aware of what and how much you are eating. HOW CAN I CALORIE COUNT WHEN EATING OUT?  Ask for smaller portion sizes or child-sized portions.  Consider sharing an entree and sides instead of getting your own entree.  If you get your own entree, eat only half. Ask for a box at the beginning of your meal and put the rest of your entree in it so you are not tempted to eat it.  Look for the calories on the menu. If calories are listed, choose the lower calorie options.  Choose dishes that include vegetables, fruits, whole grains, low-fat dairy products, and lean protein. Focusing on smart food choices from each of the 5 food groups can help you stay on track at restaurants.  Choose items that are boiled, broiled, grilled, or steamed.  Choose water, milk, unsweetened iced tea, or other drinks without added sugars. If you want an alcoholic beverage, choose a lower calorie option. For example, a regular margarita can have up to 700 calories and a glass of wine has around 150.  Stay away from items that are buttered, battered, fried, or served with cream sauce. Items labeled "crispy" are usually fried, unless stated otherwise.  Ask for dressings, sauces, and syrups on the side. These are usually very high in  calories, so do not eat much of them.  Watch out for salads. Many people think salads are a healthy option, but this is often not the case. Many salads come with bacon, fried chicken, lots of cheese, fried chips, and dressing. All of these items have a lot of calories. If you want a salad, choose a garden salad and ask for grilled meats or steak. Ask for the dressing on the side, or ask for olive oil and vinegar or lemon to use as dressing.  Estimate how many servings of a food you are given. For example, a serving of cooked rice is  cup or about the size of half a tennis ball or one cupcake wrapper. Knowing serving sizes will help you be aware of how much food you are eating at restaurants. The list below tells you how big or small some common portion sizes are based on everyday objects.  1 oz--4 stacked dice.  3 oz--1 deck of cards.  1 tsp--1 dice.  1 Tbsp-- a Ping-Pong ball.  2 Tbsp--1 Ping-Pong ball.   cup--1 tennis ball or 1 cupcake wrapper.  1 cup--1 baseball.   This information is not intended to replace advice given to you by your health care provider. Make sure you discuss any questions you have with your health care provider.   Document Released: 04/20/2005 Document Revised: 05/11/2014 Document Reviewed: 02/23/2013 Elsevier Interactive Patient Education [employer]

## 2016-02-28 NOTE — Assessment & Plan Note (Addendum)
Due for repeat lipids today. Long discussion today regarding importance of weight loss through exercise and diet.

## 2016-03-02 ENCOUNTER — Encounter: Payer: Self-pay | Admitting: *Deleted

## 2016-03-04 ENCOUNTER — Other Ambulatory Visit: Payer: Self-pay | Admitting: Primary Care

## 2016-04-02 ENCOUNTER — Other Ambulatory Visit: Payer: Self-pay | Admitting: *Deleted

## 2016-04-02 MED ORDER — ALPRAZOLAM 0.5 MG PO TABS
ORAL_TABLET | ORAL | 0 refills | Status: DC
Start: 2016-04-02 — End: 2016-05-25

## 2016-04-02 NOTE — Telephone Encounter (Signed)
Px written for call in   

## 2016-04-02 NOTE — Telephone Encounter (Signed)
Pt had a CPE with Jae DireKate on 02/28/16, last filled on 11/14/15 #30 tabs with 0 refills, please advise

## 2016-04-02 NOTE — Telephone Encounter (Signed)
Rx called in as prescribed 

## 2016-04-30 ENCOUNTER — Encounter: Payer: Self-pay | Admitting: Internal Medicine

## 2016-04-30 ENCOUNTER — Ambulatory Visit (INDEPENDENT_AMBULATORY_CARE_PROVIDER_SITE_OTHER): Payer: 59 | Admitting: Internal Medicine

## 2016-04-30 VITALS — BP 110/80 | HR 124 | Temp 97.9°F | Wt 213.0 lb

## 2016-04-30 DIAGNOSIS — J0141 Acute recurrent pansinusitis: Secondary | ICD-10-CM | POA: Diagnosis not present

## 2016-04-30 MED ORDER — PREDNISONE 20 MG PO TABS: 40.0000 mg | ORAL_TABLET | Freq: Every day | ORAL | 0 refills | Status: DC

## 2016-04-30 MED ORDER — AMOXICILLIN-POT CLAVULANATE 875-125 MG PO TABS: 1.0000 | ORAL_TABLET | Freq: Two times a day (BID) | ORAL | 0 refills | Status: AC

## 2016-04-30 NOTE — Progress Notes (Signed)
Subjective:    Patient ID: Yvonne Trujillo, female    DOB: 1986/04/04, 30 y.o.   MRN: [id number]  HPI Here due to sinus symptoms  Has had recurrent sinus infections this year Made appointment with [facility] (but no appts till end of January)  Last visit she got z-pak ---which helped but didn't get rid of it  Symptoms worsened about 1 week ago Pain along right frontal head and down towards maxilla No cough No drainage No ear pain No sore throat  claritin and flonase regularly Has left over prednisone (20mg ?)---no clear response Sudafed and ibuprofen for the acute pain--not really helping  Current Outpatient Prescriptions on File Prior to Visit  Medication Sig Dispense Refill  . albuterol (PROVENTIL HFA;VENTOLIN HFA) 108 (90 Base) MCG/ACT inhaler Inhale 2 puffs into the lungs every 4 (four) hours as needed for wheezing or shortness of breath. 1 Inhaler 3  . ALPRAZolam (XANAX) 0.5 MG tablet TAKE ONE TABLET TWICE A DAY IF NEEDED FOR ANXIETY OR SLEEP 30 tablet 0  . drospirenone-ethinyl estradiol (YASMIN,ZARAH,SYEDA) 3-0.03 MG tablet TAKE 1 TABLET DAILY 28 tablet 5  . ibuprofen (ADVIL,MOTRIN) 200 MG tablet Take 200 mg by mouth every 6 (six) hours as needed for moderate pain.     Marland Kitchen. ondansetron (ZOFRAN) 4 MG tablet TAKE 1 TABLET BY MOUTH EVERY 8 HOURS AS NEEDED FOR NAUSEA OR VOMITING 90 tablet 0  . SUMAtriptan (IMITREX) 100 MG tablet TAKE 1 TABLET BY MOUTH EVERY 2 HOURS AS NEEDED FOR MIGRAINE 10 tablet 5  . valACYclovir (VALTREX) 1000 MG tablet TAKE 2 TABLETS BY MOUTH 2 TIMES A DAY FOR 1 DAY 20 tablet 0   No current facility-administered medications on file prior to visit.     No Known Allergies  Past Medical History:  Diagnosis Date  . Concussion   . History of depression   . Kidney infection   . Kidney stone   . Migraine     Past Surgical History:  Procedure Laterality Date  . APPENDECTOMY      Family History  Problem Relation Age of Onset  . Alcohol abuse Maternal  Uncle   . Hyperlipidemia Mother   . Hyperlipidemia Father   . Hypertension Father   . Hypertension Mother   . Heart disease Mother   . Diabetes Cousin     Social History   Social History  . Marital status: Single    Spouse name: N/A  . Number of children: N/A  . Years of education: N/A   Occupational History  . Not on file.   Social History Main Topics  . Smoking status: Former Smoker    Years: 6.00    Types: Cigarettes  . Smokeless tobacco: Never Used  . Alcohol use 0.0 oz/week     Comment: socially  . Drug use: No  . Sexual activity: Not on file   Other Topics Concern  . Not on file   Social History Narrative  . No narrative on file   Review of Systems No fever No vomiting or diarrhea     Objective:   Physical Exam  Constitutional: She appears well-nourished. No distress.  HENT:  Mouth/Throat: No oropharyngeal exudate.  Right maxillary >right frontal tenderness Marked nasal inflammation ?polyp on right 1+ tonsils--but not inflamed  Neck: Normal range of motion.  Pulmonary/Chest: Effort normal and breath sounds normal. No respiratory distress. She has no wheezes. She has no rales.  Lymphadenopathy:    She has no cervical adenopathy.  Assessment & Plan:

## 2016-04-30 NOTE — Progress Notes (Signed)
Pre visit review using our clinic review tool, if applicable. No additional management support is needed unless otherwise documented below in the visit note. 

## 2016-04-30 NOTE — Assessment & Plan Note (Signed)
Not much symptoms except general and the pressure ?polyp Will have her increase the flonase to bid Trial augmentin Has the ENT appt already

## 2016-05-02 ENCOUNTER — Other Ambulatory Visit: Payer: Self-pay | Admitting: Family Medicine

## 2016-05-05 NOTE — Telephone Encounter (Signed)
Pt had CPE with Jae Dire[facility] on 02/28/16, last filled on 11/19/15 #90 tabs with 0 refill, please advise

## 2016-05-05 NOTE — Telephone Encounter (Signed)
Will refill electronically  

## 2016-05-06 ENCOUNTER — Telehealth: Payer: Self-pay

## 2016-05-06 MED ORDER — FLUCONAZOLE 150 MG PO TABS: 150.0000 mg | ORAL_TABLET | Freq: Once | ORAL | 1 refills | Status: AC

## 2016-05-06 NOTE — Telephone Encounter (Signed)
Spoke to pt

## 2016-05-06 NOTE — Telephone Encounter (Signed)
Please let her know the Rx was sent 

## 2016-05-06 NOTE — Telephone Encounter (Signed)
Pt left v/m; pt was seen 04/30/16 and given abx. Now pt has yeast infection and request [facility]luc[facility].pt request cb when med sent to pharmacy.

## 2016-05-25 ENCOUNTER — Other Ambulatory Visit: Payer: Self-pay | Admitting: Family Medicine

## 2016-05-25 NOTE — Telephone Encounter (Signed)
Px written for call in   

## 2016-05-25 NOTE — Telephone Encounter (Signed)
Rx called in as prescribed 

## 2016-05-25 NOTE — Telephone Encounter (Signed)
Pt had CPE with Jae DireKate on 02/28/16, last filled on 04/02/16 #30 tabs with 0 refills, please advise

## 2016-07-06 ENCOUNTER — Emergency Department: Payer: 59

## 2016-07-06 ENCOUNTER — Encounter: Payer: Self-pay | Admitting: Emergency Medicine

## 2016-07-06 ENCOUNTER — Emergency Department
Admission: EM | Admit: 2016-07-06 | Discharge: 2016-07-06 | Disposition: A | Discharge status: Home / Self Care | Payer: 59 | Attending: Emergency Medicine | Admitting: Emergency Medicine

## 2016-07-06 DIAGNOSIS — R109 Unspecified abdominal pain: Secondary | ICD-10-CM | POA: Diagnosis present

## 2016-07-06 DIAGNOSIS — N201 Calculus of ureter: Secondary | ICD-10-CM | POA: Diagnosis not present

## 2016-07-06 DIAGNOSIS — Z87891 Personal history of nicotine dependence: Secondary | ICD-10-CM | POA: Diagnosis not present

## 2016-07-06 LAB — COMPREHENSIVE METABOLIC PANEL
ALT: 13 U/L — AB (ref 14–54)
AST: 17 U/L (ref 15–41)
Albumin: 3.3 g/dL — ABNORMAL LOW (ref 3.5–5.0)
Alkaline Phosphatase: 87 U/L (ref 38–126)
Anion gap: 7 (ref 5–15)
BUN: 10 mg/dL (ref 6–20)
CHLORIDE: 105 mmol/L (ref 101–111)
CO2: 25 mmol/L (ref 22–32)
CREATININE: 0.58 mg/dL (ref 0.44–1.00)
Calcium: 8.4 mg/dL — ABNORMAL LOW (ref 8.9–10.3)
GFR calc Af Amer: >60 mL/min (ref >60)
GFR calc non Af Amer: >60 mL/min (ref >60)
Glucose, Bld: 109 mg/dL — ABNORMAL HIGH (ref 65–99)
Potassium: 4 mmol/L (ref 3.5–5.1)
Sodium: 137 mmol/L (ref 135–145)
Total Bilirubin: 0.4 mg/dL (ref 0.3–1.2)
Total Protein: 6.9 g/dL (ref 6.5–8.1)

## 2016-07-06 LAB — POCT PREGNANCY, URINE: Preg Test, Ur: NEGATIVE

## 2016-07-06 LAB — CBC
HCT: 36.8 % (ref 35.0–47.0)
HEMOGLOBIN: 12.7 g/dL (ref 12.0–16.0)
MCH: 28.6 pg (ref 26.0–34.0)
MCHC: 34.4 g/dL (ref 32.0–36.0)
MCV: 83.2 fL (ref 80.0–100.0)
PLATELETS: 400 10*3/uL (ref 150–440)
RBC: 4.43 MIL/uL (ref 3.80–5.20)
RDW: 12.7 % (ref 11.5–14.5)
WBC: 10.3 10*3/uL (ref 3.6–11.0)

## 2016-07-06 LAB — URINALYSIS, COMPLETE (UACMP) WITH MICROSCOPIC
BILIRUBIN URINE: NEGATIVE
Bacteria, UA: NONE SEEN
Glucose, UA: NEGATIVE mg/dL
Ketones, ur: NEGATIVE mg/dL
NITRITE: NEGATIVE
PH: 6 (ref 5.0–8.0)
Protein, ur: NEGATIVE mg/dL
SPECIFIC GRAVITY, URINE: 1.013 (ref 1.005–1.030)

## 2016-07-06 LAB — LIPASE, BLOOD: LIPASE: 13 U/L (ref 11–51)

## 2016-07-06 MED ORDER — KETOROLAC TROMETHAMINE 30 MG/ML IJ SOLN
INTRAMUSCULAR | Status: AC
  Filled 2016-07-06: qty 1

## 2016-07-06 MED ORDER — ONDANSETRON HCL 4 MG/2ML IJ SOLN
4.0000 mg | Freq: Once | INTRAMUSCULAR | Status: AC
  Administered 2016-07-06: 4 mg via INTRAVENOUS
  Filled 2016-07-06: qty 2

## 2016-07-06 MED ORDER — MORPHINE SULFATE (PF) 4 MG/ML IV SOLN
4.0000 mg | Freq: Once | INTRAVENOUS | Status: AC
  Administered 2016-07-06: 4 mg via INTRAVENOUS
  Filled 2016-07-06: qty 1

## 2016-07-06 MED ORDER — SODIUM CHLORIDE 0.9 % IV SOLN
1000.0000 mL | Freq: Once | INTRAVENOUS | Status: AC
  Administered 2016-07-06: 1000 mL via INTRAVENOUS

## 2016-07-06 MED ORDER — MORPHINE SULFATE (PF) 2 MG/ML IV SOLN
2.0000 mg | INTRAVENOUS | Status: AC
Start: 2016-07-06 — End: 2016-07-06
  Administered 2016-07-06: 2 mg via INTRAVENOUS
  Filled 2016-07-06: qty 1

## 2016-07-06 MED ORDER — OXYCODONE-ACETAMINOPHEN 5-325 MG PO TABS: 1.0000 | ORAL_TABLET | Freq: Four times a day (QID) | ORAL | 0 refills | Status: DC | PRN

## 2016-07-06 MED ORDER — TAMSULOSIN HCL 0.4 MG PO CAPS: 0.4000 mg | ORAL_CAPSULE | Freq: Every day | ORAL | 0 refills | Status: DC

## 2016-07-06 MED ORDER — ONDANSETRON HCL 4 MG PO TABS: 4.0000 mg | ORAL_TABLET | Freq: Every day | ORAL | 1 refills | Status: DC | PRN

## 2016-07-06 NOTE — ED Triage Notes (Signed)
C/O left flank and lower abdominal pain.  Back pain x 1 week.  Pain has moved through left flank and now left lower abd.

## 2016-07-06 NOTE — ED Provider Notes (Signed)
[facility] Emergency Department Provider Note   ____________________________________________    I have reviewed the triage vital signs and the nursing notes.   HISTORY  Chief Complaint Flank Pain     HPI Yvonne Trujillo R Divis is a 31 y.o. female who presents with complaints of left flank pain. Patient reports pain feels similar to prior episodes of renal colic. She denies fevers or chills. No dysuria. Mild nausea, no vomiting. Symptoms have been getting worse over the last 2 days. No diarrhea. Pain is sharp and cramping in nature   Past Medical History:  Diagnosis Date  . Concussion   . History of depression   . Kidney infection   . Kidney stone   . Migraine     Patient Active Problem List   Diagnosis Date Noted  . Acute recurrent pansinusitis 04/30/2016  . Acute bronchitis 07/30/2015  . Herpes simplex 01/18/2015  . Encounter for routine gynecological examination 06/12/2013  . Screen for STD (sexually transmitted disease) 06/12/2013  . Leukocytosis, unspecified 06/12/2013  . Hyperlipidemia 04/25/2013  . Amenorrhea 04/24/2013  . Migraine 04/24/2013  . Depression with anxiety 04/24/2013  . Hirsutism 04/24/2013  . Acne 04/24/2013  . History of kidney stones 04/24/2013  . Screening for diabetes mellitus 04/24/2013  . Routine general medical examination at a health care facility 04/24/2013    Past Surgical History:  Procedure Laterality Date  . APPENDECTOMY      Prior to Admission medications   Medication Sig Start Date End Date Taking? Authorizing Provider  albuterol (PROVENTIL HFA;VENTOLIN HFA) 108 (90 Base) MCG/ACT inhaler Inhale 2 puffs into the lungs every 4 (four) hours as needed for wheezing or shortness of breath. 07/30/15   Judy PimpleMarne A Tower, MD  ALPRAZolam Prudy Feeler(XANAX) 0.5 MG tablet TAKE 1 TABLET BY MOUTH TWICE DAILY AS NEEDED FOR ANXIETY OR SLEEP 05/25/16   Judy PimpleMarne A Tower, MD  drospirenone-ethinyl estradiol (YASMIN,ZARAH,SYEDA) 3-0.03 MG tablet  TAKE 1 TABLET DAILY 03/04/16   Judy PimpleMarne A Tower, MD  ibuprofen (ADVIL,MOTRIN) 200 MG tablet Take 200 mg by mouth every 6 (six) hours as needed for moderate pain.     Historical Provider, MD  ondansetron (ZOFRAN) 4 MG tablet Take 1 tablet (4 mg total) by mouth daily as needed for nausea or vomiting. 07/06/16   Jene Everyobert Yarely Bebee, MD  oxyCODONE-acetaminophen (ROXICET) 5-325 MG tablet Take 1 tablet by mouth every 6 (six) hours as needed. 07/06/16 07/06/17  Jene Everyobert Hanna Ra, MD  predniSONE (DELTASONE) 20 MG tablet Take 2 tablets (40 mg total) by mouth daily. 04/30/16   Karie Schwalbeichard I Letvak, MD  SUMAtriptan (IMITREX) 100 MG tablet TAKE 1 TABLET BY MOUTH EVERY 2 HOURS AS NEEDED FOR MIGRAINE 01/08/16   Judy PimpleMarne A Tower, MD  tamsulosin (FLOMAX) 0.4 MG CAPS capsule Take 1 capsule (0.4 mg total) by mouth daily. 07/06/16   Jene Everyobert Jhaniya Briski, MD  valACYclovir (VALTREX) 1000 MG tablet TAKE 2 TABLETS BY MOUTH 2 TIMES A DAY FOR 1 DAY 01/18/15   Doreene NestKatherine K Clark, NP     Allergies Patient has no known allergies.  Family History  Problem Relation Age of Onset  . Alcohol abuse Maternal Uncle   . Hyperlipidemia Mother   . Hypertension Mother   . Heart disease Mother   . Hyperlipidemia Father   . Hypertension Father   . Diabetes Cousin     Social History Social History  Substance Use Topics  . Smoking status: Former Smoker    Years: 6.00    Types: Cigarettes  .  Smokeless tobacco: Never Used  . Alcohol use 0.0 oz/week     Comment: socially    Review of Systems  Constitutional: No fever/chills Eyes: No visual changes.  ENT: No sore throat. Cardiovascular: Denies chest pain. Respiratory: Denies shortness of breath. Gastrointestinal: As above Genitourinary: Dark urine Musculoskeletal: Negative for back pain. Skin: Negative for rash. Neurological: Negative for headaches   10-point ROS otherwise negative.  ____________________________________________   PHYSICAL EXAM:  VITAL SIGNS: ED Triage Vitals  Enc Vitals Group      BP 07/06/16 0907 125/80     Pulse Rate 07/06/16 0907 95     Resp 07/06/16 0907 18     Temp 07/06/16 0907 98 F (36.7 C)     Temp Source 07/06/16 0907 Oral     SpO2 07/06/16 0907 98 %     Weight 07/06/16 0908 200 lb (90.7 kg)     Height 07/06/16 0908 5\' 1"  (1.549 m)     Head Circumference --      Peak Flow --      Pain Score 07/06/16 0912 7     Pain Loc --      Pain Edu? --      Excl. in GC? --     Constitutional: Alert and oriented. No acute distress. Pleasant and interactive Eyes: Conjunctivae are normal.   Nose: No congestion/rhinnorhea. Mouth/Throat: Mucous membranes are moist.    Cardiovascular: Normal rate, regular rhythm. Grossly normal heart sounds.  Good peripheral circulation. Respiratory: Normal respiratory effort.  No retractions. Lungs CTAB. Gastrointestinal: Soft and nontender. No distention.  No CVA tenderness.  Musculoskeletal: No lower extremity tenderness nor edema.  Warm and well perfused Neurologic:  Normal speech and language. No gross focal neurologic deficits are appreciated.  Skin:  Skin is warm, dry and intact. No rash noted. Psychiatric: Mood and affect are normal. Speech and behavior are normal.  ____________________________________________   LABS (all labs ordered are listed, but only abnormal results are displayed)  Labs Reviewed  COMPREHENSIVE METABOLIC PANEL - Abnormal; Notable for the following:       Result Value   Glucose, Bld 109 (*)    Calcium 8.4 (*)    Albumin 3.3 (*)    ALT 13 (*)    All other components within normal limits  URINALYSIS, COMPLETE (UACMP) WITH MICROSCOPIC - Abnormal; Notable for the following:    Color, Urine YELLOW (*)    APPearance HAZY (*)    Hgb urine dipstick LARGE (*)    Leukocytes, UA TRACE (*)    Squamous Epithelial / LPF 0-5 (*)    All other components within normal limits  CBC  LIPASE, BLOOD  POCT PREGNANCY, URINE    ____________________________________________  EKG  None ____________________________________________  RADIOLOGY  Ultrasound shows 1.4 cm left proximal ureteral stone ____________________________________________   PROCEDURES  Procedure(s) performed: No    Critical Care performed: No ____________________________________________   INITIAL IMPRESSION / ASSESSMENT AND PLAN / ED COURSE  Pertinent labs & imaging results that were available during my care of the patient were reviewed by me and considered in my medical decision making (see chart for details).  She presents with flank pain, urinalysis consistent with renal colic. Ultrasound and a mild hydronephrosis. Patient treated with 4 mg of IV morphine followed by an additional 2 mg with resolution of her pain. Discussed with Dr. Apolinar Junes of urology who can see the patient tomorrow at 2 PM and arrange for lithotripsy. Patient is quite content with this plan. She knows  she can return any time if pain becomes uncontrollable    ____________________________________________   FINAL CLINICAL IMPRESSION(S) / ED DIAGNOSES  Final diagnoses:  Ureterolithiasis      NEW MEDICATIONS STARTED DURING THIS VISIT:  Discharge Medication List as of 07/06/2016  1:00 PM    START taking these medications   Details  oxyCODONE-acetaminophen (ROXICET) 5-325 MG tablet Take 1 tablet by mouth every 6 (six) hours as needed., Starting Mon 07/06/2016, Until Tue 07/06/2017, Print    tamsulosin (FLOMAX) 0.4 MG CAPS capsule Take 1 capsule (0.4 mg total) by mouth daily., Starting Mon 07/06/2016, Print         Note:  This document was prepared using Dragon voice recognition software and may include unintentional dictation errors.    Jene Every, MD 07/06/16 1434

## 2016-07-06 NOTE — Discharge Instructions (Signed)
Do not take ibuprofen, aleve, or aspirin!

## 2016-07-06 NOTE — ED Notes (Signed)
Pt discharged home after verbalizing understanding of discharge instructions; nad noted. 

## 2016-07-06 NOTE — ED Notes (Signed)
Pt assisted to toilet 

## 2016-07-07 ENCOUNTER — Other Ambulatory Visit: Payer: Self-pay | Admitting: Radiology

## 2016-07-07 ENCOUNTER — Encounter: Payer: Self-pay | Admitting: Urology

## 2016-07-07 ENCOUNTER — Ambulatory Visit: Payer: 59 | Admitting: Urology

## 2016-07-07 VITALS — BP 126/84 | HR 93 | Ht 61.0 in | Wt 215.4 lb

## 2016-07-07 DIAGNOSIS — R109 Unspecified abdominal pain: Secondary | ICD-10-CM

## 2016-07-07 DIAGNOSIS — N200 Calculus of kidney: Secondary | ICD-10-CM | POA: Diagnosis not present

## 2016-07-07 DIAGNOSIS — N201 Calculus of ureter: Secondary | ICD-10-CM | POA: Diagnosis not present

## 2016-07-07 MED ORDER — OXYCODONE HCL 5 MG PO TABS: 5.0000 mg | ORAL_TABLET | ORAL | 0 refills | Status: DC | PRN

## 2016-07-07 NOTE — Progress Notes (Signed)
07/07/2016 2:45 PM   Yvonne Trujillo 1986-04-07 [id number]  Referring provider: Judy Pimple, MD [address] [place], [place] [address]  Chief Complaint  Patient presents with  . Nephrolithiasis    HPI: 31 year old female who presents to the office today for further evaluation/treatment of left-sided UPJ stones. She's had several emergency room visits over the past year with CT stones which show the cluster of left UPJ stones measuring 9 x 8 x 6 mm as well as several nonobstructing left-sided stones. Yesterday, she presented to the emergency room with acute onset left flank pain. Renal ultrasound was indicative of hydronephrosis consistent with obstruction. These were also appreciated on KUB.  Urinalysis showed too numerous to count red blood cells with 6-30 white blood cells but no bacteria present.   Creatinine is 0.58.  WBC 10.3.  She is been struggling with intermittent flank pain on the left side specifically since at least 2015-10-24. She has passed all of her previous stones spontaneously and not required to be surgical intervention. She's never seen a urologist in the past. She is passed a total of 3 stones every several years.  Her first kidney stone was around the age of 16. She is a strong family history of kidney stones and her parents as well as her sibling.  Today, she continues to have left flank pain. No dysuria, urinary frequency, gross hematuria, fevers, or chills. She is able to eat and drink without difficulty.    PMH: Past Medical History:  Diagnosis Date  . Concussion   . History of depression   . Kidney infection   . Kidney stone   . Migraine     Surgical History: Past Surgical History:  Procedure Laterality Date  . APPENDECTOMY      Home Medications:  Allergies as of 07/07/2016   No Known Allergies     Medication List       Accurate as of 07/07/16  2:45 PM. Always use your most recent med list.          albuterol 108 (90 Base) MCG/ACT  inhaler Commonly known as:  PROVENTIL HFA;VENTOLIN HFA Inhale 2 puffs into the lungs every 4 (four) hours as needed for wheezing or shortness of breath.   ALPRAZolam 0.5 MG tablet Commonly known as:  XANAX TAKE 1 TABLET BY MOUTH TWICE DAILY AS NEEDED FOR ANXIETY OR SLEEP   ibuprofen 200 MG tablet Commonly known as:  ADVIL,MOTRIN Take 200 mg by mouth every 6 (six) hours as needed for moderate pain.   ondansetron 4 MG tablet Commonly known as:  ZOFRAN Take 1 tablet (4 mg total) by mouth daily as needed for nausea or vomiting.   oxyCODONE 5 MG immediate release tablet Commonly known as:  ROXICODONE Take 1 tablet (5 mg total) by mouth every 4 (four) hours as needed.   oxyCODONE-acetaminophen 5-325 MG tablet Commonly known as:  ROXICET Take 1 tablet by mouth every 6 (six) hours as needed.   SUMAtriptan 100 MG tablet Commonly known as:  IMITREX TAKE 1 TABLET BY MOUTH EVERY 2 HOURS AS NEEDED FOR MIGRAINE   tamsulosin 0.4 MG Caps capsule Commonly known as:  FLOMAX Take 1 capsule (0.4 mg total) by mouth daily.   valACYclovir 1000 MG tablet Commonly known as:  VALTREX TAKE 2 TABLETS BY MOUTH 2 TIMES A DAY FOR 1 DAY       Allergies: No Known Allergies  Family History: Family History  Problem Relation Age of Onset  .  Alcohol abuse Maternal Uncle   . Hyperlipidemia Mother   . Hypertension Mother   . Heart disease Mother   . Hyperlipidemia Father   . Hypertension Father   . Diabetes Cousin   . Bladder Cancer Neg Hx   . Kidney cancer Neg Hx     Social History:  reports that she has quit smoking. Her smoking use included Cigarettes. She quit after 6.00 years of use. She has never used smokeless tobacco. She reports that she drinks alcohol. She reports that she does not use drugs.  ROS: UROLOGY Frequent Urination?: Yes Hard to postpone urination?: No Burning/pain with urination?: No Get up at night to urinate?: Yes Leakage of urine?: No Urine stream starts and stops?:  No Trouble starting stream?: Yes Do you have to strain to urinate?: No Blood in urine?: Yes Urinary tract infection?: No Sexually transmitted disease?: No Injury to kidneys or bladder?: No Painful intercourse?: No Weak stream?: No Currently pregnant?: No Vaginal bleeding?: No Last menstrual period?: 07/02/2016  Gastrointestinal Nausea?: Yes Vomiting?: No Indigestion/heartburn?: No Diarrhea?: No Constipation?: No  Constitutional Fever: No Night sweats?: No Weight loss?: No Fatigue?: No  Skin Skin rash/lesions?: No Itching?: No  Eyes Blurred vision?: No Double vision?: No  Ears/Nose/Throat Sore throat?: No Sinus problems?: No  Hematologic/Lymphatic Swollen glands?: No Easy bruising?: No  Cardiovascular Leg swelling?: No Chest pain?: No  Respiratory Cough?: No Shortness of breath?: No  Endocrine Excessive thirst?: No  Musculoskeletal Back pain?: No Joint pain?: No  Neurological Headaches?: No Dizziness?: No  Psychologic Depression?: No Anxiety?: Yes  Physical Exam: BP 126/84 (BP Location: Left Arm, Patient Position: Sitting, Cuff Size: Normal)   Pulse 93   Ht 5\' 1"  (1.549 m)   Wt 215 lb 6.4 oz (97.7 kg)   LMP 07/02/2016   BMI 40.70 kg/m   Constitutional:  Alert and oriented, No acute distress. HEENT: [place] AT, moist mucus membranes.  Trachea midline, no masses. Cardiovascular: No clubbing, cyanosis, or edema. Respiratory: Normal respiratory effort, no increased work of breathing. GI: Abdomen is soft, nontender, nondistended, no abdominal masses GU: Mild left CVA tenderness. Skin: No rashes, bruises or suspicious lesions. Neurologic: Grossly intact, no focal deficits, moving all 4 extremities. Psychiatric: Normal mood and affect.  Laboratory Data: Lab Results  Component Value Date   WBC 10.3 07/06/2016   HGB 12.7 07/06/2016   HCT 36.8 07/06/2016   MCV 83.2 07/06/2016   PLT 400 07/06/2016    Lab Results  Component Value Date    CREATININE 0.58 07/06/2016    Urinalysis    Component Value Date/Time   COLORURINE YELLOW (A) 07/06/2016 0932   APPEARANCEUR HAZY (A) 07/06/2016 0932   LABSPEC 1.013 07/06/2016 0932   PHURINE 6.0 07/06/2016 0932   GLUCOSEU NEGATIVE 07/06/2016 0932   HGBUR LARGE (A) 07/06/2016 0932   BILIRUBINUR NEGATIVE 07/06/2016 0932   BILIRUBINUR neg 07/27/2013 1331   KETONESUR NEGATIVE 07/06/2016 0932   PROTEINUR NEGATIVE 07/06/2016 0932   UROBILINOGEN 0.2 02/27/2014 0641   NITRITE NEGATIVE 07/06/2016 0932   LEUKOCYTESUR TRACE (A) 07/06/2016 0932    Pertinent Imaging: KUB and renal ultrasound from 07/06/2016 reviewed personally today. This is compared to previous CT scan of 10/21/2015.  Assessment & Plan:    1. Obstruction of left ureteropelvic junction (UPJ) due to stone Cluster of approximately 10 mm UPJ stone which is likely ball valving causing intermittent obstruction, present since at least 10/2015  Hounsfield units approximately ~900, stones can distance <12 cm  Given the persistence  stones with intermittent pain, size, and location, I have recommended surgical intervention  We discussed various treatment options including ESWL vs. ureteroscopy, laser lithotripsy, and stent. We discussed the risks and benefits of both including bleeding, infection, damage to surrounding structures, efficacy with need for possible further intervention, and need for temporary ureteral stent.  Specifically, stone clearance rates for each were reviewed.  She is most interested in shockwave therapy late rthis week. Specific risk and benefits were reviewed in detail.  She understands that only the stones at the UPJ could be treated at this time using shockwave as a modality.  Preop urine culture  - CULTURE, URINE COMPREHENSIVE  2. Left nephrolithiasis As above, nonobstructing 4 mm LLP stone  We will likely reocmmend to continue the small nonobstructing stone.  Recommend metabolic work up after ESWL  given size, number of stones, and age.  3. Left flank pain Secondary to stone   Schedule left ESWL  Vanna Scotland, MD  [facility] [address], [address] [place], [place] [address] [phone]

## 2016-07-08 MED ORDER — CIPROFLOXACIN HCL 500 MG PO TABS
500.0000 mg | ORAL_TABLET | ORAL | Status: AC
  Administered 2016-07-09: 500 mg via ORAL

## 2016-07-09 ENCOUNTER — Encounter
Admission: RE | Disposition: A | Discharge status: Home / Self Care | Payer: Self-pay | Source: Ambulatory Visit | Attending: Urology

## 2016-07-09 ENCOUNTER — Encounter: Payer: Self-pay | Admitting: Urology

## 2016-07-09 ENCOUNTER — Encounter: Payer: Self-pay | Admitting: *Deleted

## 2016-07-09 ENCOUNTER — Ambulatory Visit
Admission: RE | Admit: 2016-07-09 | Discharge: 2016-07-09 | Disposition: A | Discharge status: Home / Self Care | Payer: 59 | Source: Ambulatory Visit | Attending: Urology | Admitting: Urology

## 2016-07-09 DIAGNOSIS — F329 Major depressive disorder, single episode, unspecified: Secondary | ICD-10-CM | POA: Diagnosis not present

## 2016-07-09 DIAGNOSIS — Z79891 Long term (current) use of opiate analgesic: Secondary | ICD-10-CM | POA: Insufficient documentation

## 2016-07-09 DIAGNOSIS — Z87448 Personal history of other diseases of urinary system: Secondary | ICD-10-CM | POA: Diagnosis not present

## 2016-07-09 DIAGNOSIS — Z9049 Acquired absence of other specified parts of digestive tract: Secondary | ICD-10-CM | POA: Diagnosis not present

## 2016-07-09 DIAGNOSIS — Z8249 Family history of ischemic heart disease and other diseases of the circulatory system: Secondary | ICD-10-CM | POA: Diagnosis not present

## 2016-07-09 DIAGNOSIS — Z87442 Personal history of urinary calculi: Secondary | ICD-10-CM | POA: Insufficient documentation

## 2016-07-09 DIAGNOSIS — Z8349 Family history of other endocrine, nutritional and metabolic diseases: Secondary | ICD-10-CM | POA: Insufficient documentation

## 2016-07-09 DIAGNOSIS — Z79899 Other long term (current) drug therapy: Secondary | ICD-10-CM | POA: Insufficient documentation

## 2016-07-09 DIAGNOSIS — F419 Anxiety disorder, unspecified: Secondary | ICD-10-CM | POA: Insufficient documentation

## 2016-07-09 DIAGNOSIS — Z87891 Personal history of nicotine dependence: Secondary | ICD-10-CM | POA: Insufficient documentation

## 2016-07-09 DIAGNOSIS — E669 Obesity, unspecified: Secondary | ICD-10-CM | POA: Insufficient documentation

## 2016-07-09 DIAGNOSIS — Z833 Family history of diabetes mellitus: Secondary | ICD-10-CM | POA: Insufficient documentation

## 2016-07-09 DIAGNOSIS — G43909 Migraine, unspecified, not intractable, without status migrainosus: Secondary | ICD-10-CM | POA: Diagnosis not present

## 2016-07-09 DIAGNOSIS — Z7951 Long term (current) use of inhaled steroids: Secondary | ICD-10-CM | POA: Insufficient documentation

## 2016-07-09 DIAGNOSIS — Z811 Family history of alcohol abuse and dependence: Secondary | ICD-10-CM | POA: Diagnosis not present

## 2016-07-09 DIAGNOSIS — Z791 Long term (current) use of non-steroidal anti-inflammatories (NSAID): Secondary | ICD-10-CM | POA: Diagnosis not present

## 2016-07-09 DIAGNOSIS — N201 Calculus of ureter: Secondary | ICD-10-CM | POA: Diagnosis present

## 2016-07-09 HISTORY — PX: EXTRACORPOREAL SHOCK WAVE LITHOTRIPSY: SHX1557

## 2016-07-09 LAB — CULTURE, URINE COMPREHENSIVE

## 2016-07-09 LAB — POCT PREGNANCY, URINE: PREG TEST UR: NEGATIVE

## 2016-07-09 SURGERY — LITHOTRIPSY, ESWL
Anesthesia: Moderate Sedation | Laterality: Left

## 2016-07-09 MED ORDER — TAMSULOSIN HCL 0.4 MG PO CAPS: 0.4000 mg | ORAL_CAPSULE | Freq: Every day | ORAL | 0 refills | Status: DC

## 2016-07-09 MED ORDER — CIPROFLOXACIN HCL 500 MG PO TABS
ORAL_TABLET | ORAL | Status: AC
  Filled 2016-07-09: qty 1

## 2016-07-09 MED ORDER — DIAZEPAM 5 MG PO TABS
ORAL_TABLET | ORAL | Status: AC
  Administered 2016-07-09: 10 mg
  Filled 2016-07-09: qty 2

## 2016-07-09 MED ORDER — DIPHENHYDRAMINE HCL 25 MG PO CAPS
ORAL_CAPSULE | ORAL | Status: AC
  Administered 2016-07-09: 25 mg
  Filled 2016-07-09: qty 1

## 2016-07-09 MED ORDER — DEXTROSE-NACL 5-0.45 % IV SOLN
INTRAVENOUS | Status: DC
  Administered 2016-07-09: 07:00:00 via INTRAVENOUS

## 2016-07-09 MED ORDER — OXYCODONE HCL 5 MG PO TABS: 5.0000 mg | ORAL_TABLET | ORAL | 0 refills | Status: DC | PRN

## 2016-07-09 NOTE — Discharge Instructions (Signed)

## 2016-07-09 NOTE — Interval H&P Note (Signed)
History and Physical Interval Note:  07/09/2016 8:14 AM  Yvonne Trujillo R Pickart  has presented today for surgery, with the diagnosis of kidney stones   The various methods of treatment have been discussed with the patient and family. After consideration of risks, benefits and other options for treatment, the patient has consented to  Procedure(s): EXTRACORPOREAL SHOCK WAVE LITHOTRIPSY (ESWL) (Left) as a surgical intervention .  The patient's history has been reviewed, patient examined, no change in status, stable for surgery.  I have reviewed the patient's chart and labs.  Questions were answered to the patient's satisfaction.    RRR CTAB  Vanna ScotlandAshley Jann Ra

## 2016-07-09 NOTE — H&P (View-Only) (Signed)
 07/07/2016 2:45 PM   Yvonne Trujillo 08/11/1985 [id number]  Referring provider: Marne A Tower, MD [address] [place], [place] [address]  Chief Complaint  Patient presents with  . Nephrolithiasis    HPI: 31-year-old female who presents to the office today for further evaluation/treatment of left-sided UPJ stones. She's had several emergency room visits over the past year with CT stones which show the cluster of left UPJ stones measuring 9 x 8 x 6 mm as well as several nonobstructing left-sided stones. Yesterday, she presented to the emergency room with acute onset left flank pain. Renal ultrasound was indicative of hydronephrosis consistent with obstruction. These were also appreciated on KUB.  Urinalysis showed too numerous to count red blood cells with 6-30 white blood cells but no bacteria present.   Creatinine is 0.58.  WBC 10.3.  She is been struggling with intermittent flank pain on the left side specifically since at least 10/2015. She has passed all of her previous stones spontaneously and not required to be surgical intervention. She's never seen a urologist in the past. She is passed a total of 3 stones every several years.  Her first kidney stone was around the age of 31. She is a strong family history of kidney stones and her parents as well as her sibling.  Today, she continues to have left flank pain. No dysuria, urinary frequency, gross hematuria, fevers, or chills. She is able to eat and drink without difficulty.    PMH: Past Medical History:  Diagnosis Date  . Concussion   . History of depression   . Kidney infection   . Kidney stone   . Migraine     Surgical History: Past Surgical History:  Procedure Laterality Date  . APPENDECTOMY      Home Medications:  Allergies as of 07/07/2016   No Known Allergies     Medication List       Accurate as of 07/07/16  2:45 PM. Always use your most recent med list.          albuterol 108 (90 Base) MCG/ACT  inhaler Commonly known as:  PROVENTIL HFA;VENTOLIN HFA Inhale 2 puffs into the lungs every 4 (four) hours as needed for wheezing or shortness of breath.   ALPRAZolam 0.5 MG tablet Commonly known as:  XANAX TAKE 1 TABLET BY MOUTH TWICE DAILY AS NEEDED FOR ANXIETY OR SLEEP   ibuprofen 200 MG tablet Commonly known as:  ADVIL,MOTRIN Take 200 mg by mouth every 6 (six) hours as needed for moderate pain.   ondansetron 4 MG tablet Commonly known as:  ZOFRAN Take 1 tablet (4 mg total) by mouth daily as needed for nausea or vomiting.   oxyCODONE 5 MG immediate release tablet Commonly known as:  ROXICODONE Take 1 tablet (5 mg total) by mouth every 4 (four) hours as needed.   oxyCODONE-acetaminophen 5-325 MG tablet Commonly known as:  ROXICET Take 1 tablet by mouth every 6 (six) hours as needed.   SUMAtriptan 100 MG tablet Commonly known as:  IMITREX TAKE 1 TABLET BY MOUTH EVERY 2 HOURS AS NEEDED FOR MIGRAINE   tamsulosin 0.4 MG Caps capsule Commonly known as:  FLOMAX Take 1 capsule (0.4 mg total) by mouth daily.   valACYclovir 1000 MG tablet Commonly known as:  VALTREX TAKE 2 TABLETS BY MOUTH 2 TIMES A DAY FOR 1 DAY       Allergies: No Known Allergies  Family History: Family History  Problem Relation Age of Onset  .   Alcohol abuse Maternal Uncle   . Hyperlipidemia Mother   . Hypertension Mother   . Heart disease Mother   . Hyperlipidemia Father   . Hypertension Father   . Diabetes Cousin   . Bladder Cancer Neg Hx   . Kidney cancer Neg Hx     Social History:  reports that she has quit smoking. Her smoking use included Cigarettes. She quit after 6.00 years of use. She has never used smokeless tobacco. She reports that she drinks alcohol. She reports that she does not use drugs.  ROS: UROLOGY Frequent Urination?: Yes Hard to postpone urination?: No Burning/pain with urination?: No Get up at night to urinate?: Yes Leakage of urine?: No Urine stream starts and stops?:  No Trouble starting stream?: Yes Do you have to strain to urinate?: No Blood in urine?: Yes Urinary tract infection?: No Sexually transmitted disease?: No Injury to kidneys or bladder?: No Painful intercourse?: No Weak stream?: No Currently pregnant?: No Vaginal bleeding?: No Last menstrual period?: 07/02/2016  Gastrointestinal Nausea?: Yes Vomiting?: No Indigestion/heartburn?: No Diarrhea?: No Constipation?: No  Constitutional Fever: No Night sweats?: No Weight loss?: No Fatigue?: No  Skin Skin rash/lesions?: No Itching?: No  Eyes Blurred vision?: No Double vision?: No  Ears/Nose/Throat Sore throat?: No Sinus problems?: No  Hematologic/Lymphatic Swollen glands?: No Easy bruising?: No  Cardiovascular Leg swelling?: No Chest pain?: No  Respiratory Cough?: No Shortness of breath?: No  Endocrine Excessive thirst?: No  Musculoskeletal Back pain?: No Joint pain?: No  Neurological Headaches?: No Dizziness?: No  Psychologic Depression?: No Anxiety?: Yes  Physical Exam: BP 126/84 (BP Location: Left Arm, Patient Position: Sitting, Cuff Size: Normal)   Pulse 93   Ht 5' 1" (1.549 m)   Wt 215 lb 6.4 oz (97.7 kg)   LMP 07/02/2016   BMI 40.70 kg/m   Constitutional:  Alert and oriented, No acute distress. HEENT: [place] AT, moist mucus membranes.  Trachea midline, no masses. Cardiovascular: No clubbing, cyanosis, or edema. Respiratory: Normal respiratory effort, no increased work of breathing. GI: Abdomen is soft, nontender, nondistended, no abdominal masses GU: Mild left CVA tenderness. Skin: No rashes, bruises or suspicious lesions. Neurologic: Grossly intact, no focal deficits, moving all 4 extremities. Psychiatric: Normal mood and affect.  Laboratory Data: Lab Results  Component Value Date   WBC 10.3 07/06/2016   HGB 12.7 07/06/2016   HCT 36.8 07/06/2016   MCV 83.2 07/06/2016   PLT 400 07/06/2016    Lab Results  Component Value Date    CREATININE 0.58 07/06/2016    Urinalysis    Component Value Date/Time   COLORURINE YELLOW (A) 07/06/2016 0932   APPEARANCEUR HAZY (A) 07/06/2016 0932   LABSPEC 1.013 07/06/2016 0932   PHURINE 6.0 07/06/2016 0932   GLUCOSEU NEGATIVE 07/06/2016 0932   HGBUR LARGE (A) 07/06/2016 0932   BILIRUBINUR NEGATIVE 07/06/2016 0932   BILIRUBINUR neg 07/27/2013 1331   KETONESUR NEGATIVE 07/06/2016 0932   PROTEINUR NEGATIVE 07/06/2016 0932   UROBILINOGEN 0.2 02/27/2014 0641   NITRITE NEGATIVE 07/06/2016 0932   LEUKOCYTESUR TRACE (A) 07/06/2016 0932    Pertinent Imaging: KUB and renal ultrasound from 07/06/2016 reviewed personally today. This is compared to previous CT scan of 10/21/2015.  Assessment & Plan:    1. Obstruction of left ureteropelvic junction (UPJ) due to stone Cluster of approximately 10 mm UPJ stone which is likely ball valving causing intermittent obstruction, present since at least 10/2015  Hounsfield units approximately ~900, stones can distance <12 cm  Given the persistence   stones with intermittent pain, size, and location, I have recommended surgical intervention  We discussed various treatment options including ESWL vs. ureteroscopy, laser lithotripsy, and stent. We discussed the risks and benefits of both including bleeding, infection, damage to surrounding structures, efficacy with need for possible further intervention, and need for temporary ureteral stent.  Specifically, stone clearance rates for each were reviewed.  She is most interested in shockwave therapy late rthis week. Specific risk and benefits were reviewed in detail.  She understands that only the stones at the UPJ could be treated at this time using shockwave as a modality.  Preop urine culture  - CULTURE, URINE COMPREHENSIVE  2. Left nephrolithiasis As above, nonobstructing 4 mm LLP stone  We will likely reocmmend to continue the small nonobstructing stone.  Recommend metabolic work up after ESWL  given size, number of stones, and age.  3. Left flank pain Secondary to stone   Schedule left ESWL  Kendrick Remigio, MD  [facility] [address], [address] [place], [place] [address] [phone]  

## 2016-07-10 ENCOUNTER — Encounter: Payer: Self-pay | Admitting: Urology

## 2016-07-23 ENCOUNTER — Encounter: Payer: Self-pay | Admitting: Urology

## 2016-07-23 ENCOUNTER — Ambulatory Visit
Admission: RE | Admit: 2016-07-23 | Discharge: 2016-07-23 | Disposition: A | Discharge status: Home / Self Care | Payer: 59 | Source: Ambulatory Visit | Attending: Urology | Admitting: Urology

## 2016-07-23 ENCOUNTER — Ambulatory Visit (INDEPENDENT_AMBULATORY_CARE_PROVIDER_SITE_OTHER): Payer: 59 | Admitting: Urology

## 2016-07-23 VITALS — BP 107/74 | HR 92 | Ht 61.0 in | Wt 218.0 lb

## 2016-07-23 DIAGNOSIS — N201 Calculus of ureter: Secondary | ICD-10-CM

## 2016-07-23 DIAGNOSIS — N200 Calculus of kidney: Secondary | ICD-10-CM | POA: Diagnosis not present

## 2016-07-23 NOTE — Patient Instructions (Signed)
Dietary Guidelines to Help Prevent Kidney Stones Kidney stones are deposits of minerals and salts that form inside your kidneys. Your risk of developing kidney stones may be greater depending on your diet, your lifestyle, the medicines you take, and whether you have certain medical conditions. Most people can reduce their chances of developing kidney stones by following the instructions below. Depending on your overall health and the type of kidney stones you tend to develop, your dietitian may give you more specific instructions. What are tips for following this plan? Reading food labels   Choose foods with "no salt added" or "low-salt" labels. Limit your sodium intake to less than 1500 mg per day.  Choose foods with calcium for each meal and snack. Try to eat about 300 mg of calcium at each meal. Foods that contain 200-500 mg of calcium per serving include:  8 oz (237 ml) of milk, fortified nondairy milk, and fortified fruit juice.  8 oz (237 ml) of kefir, yogurt, and soy yogurt.  4 oz (118 ml) of tofu.  1 oz of cheese.  1 cup (300 g) of dried figs.  1 cup (91 g) of cooked broccoli.  1-3 oz can of sardines or mackerel.  Most people need 1000 to 1500 mg of calcium each day. Talk to your dietitian about how much calcium is recommended for you. Shopping   Buy plenty of fresh fruits and vegetables. Most people do not need to avoid fruits and vegetables, even if they contain nutrients that may contribute to kidney stones.  When shopping for convenience foods, choose:  Whole pieces of fruit.  Premade salads with dressing on the side.  Low-fat fruit and yogurt smoothies.  Avoid buying frozen meals or prepared deli foods.  Look for foods with live cultures, such as yogurt and kefir. Cooking   Do not add salt to food when cooking. Place a salt shaker on the table and allow each person to add his or her own salt to taste.  Use vegetable protein, such as beans, textured vegetable  protein (TVP), or tofu instead of meat in pasta, casseroles, and soups. Meal planning   Eat less salt, if told by your dietitian. To do this:  Avoid eating processed or premade food.  Avoid eating fast food.  Eat less animal protein, including cheese, meat, poultry, or fish, if told by your dietitian. To do this:  Limit the number of times you have meat, poultry, fish, or cheese each week. Eat a diet free of meat at least 2 days a week.  Eat only one serving each day of meat, poultry, fish, or seafood.  When you prepare animal protein, cut pieces into small portion sizes. For most meat and fish, one serving is about the size of one deck of cards.  Eat at least 5 servings of fresh fruits and vegetables each day. To do this:  Keep fruits and vegetables on hand for snacks.  Eat 1 piece of fruit or a handful of berries with breakfast.  Have a salad and fruit at lunch.  Have two kinds of vegetables at dinner.  Limit foods that are high in a substance called oxalate. These include:  Spinach.  Rhubarb.  Beets.  Potato chips and french fries.  Nuts.  If you regularly take a diuretic medicine, make sure to eat at least 1-2 fruits or vegetables high in potassium each day. These include:  Avocado.  Banana.  Orange, prune, carrot, or tomato juice.  Baked potato.  Cabbage.    Beans and split peas. General instructions   Drink enough fluid to keep your urine clear or pale yellow. This is the most important thing you can do.  Talk to your health care provider and dietitian about taking daily supplements. Depending on your health and the cause of your kidney stones, you may be advised:  Not to take supplements with vitamin C.  To take a calcium supplement.  To take a daily probiotic supplement.  To take other supplements such as magnesium, fish oil, or vitamin B6.  Take all medicines and supplements as told by your health care provider.  Limit alcohol intake to no  more than 1 drink a day for nonpregnant women and 2 drinks a day for men. One drink equals 12 oz of beer, 5 oz of wine, or 1 oz of hard liquor.  Lose weight if told by your health care provider. Work with your dietitian to find strategies and an eating plan that works best for you. What foods are not recommended? Limit your intake of the following foods, or as told by your dietitian. Talk to your dietitian about specific foods you should avoid based on the type of kidney stones and your overall health. Grains  Breads. Bagels. Rolls. Baked goods. Salted crackers. Cereal. Pasta. Vegetables  Spinach. Rhubarb. Beets. Canned vegetables. Pickles. Olives. Meats and other protein foods  Nuts. Nut butters. Large portions of meat, poultry, or fish. Salted or cured meats. Deli meats. Hot dogs. Sausages. Dairy  Cheese. Beverages  Regular soft drinks. Regular vegetable juice. Seasonings and other foods  Seasoning blends with salt. Salad dressings. Canned soups. Soy sauce. Ketchup. Barbecue sauce. Canned pasta sauce. Casseroles. Pizza. Lasagna. Frozen meals. Potato chips. French fries. Summary  You can reduce your risk of kidney stones by making changes to your diet.  The most important thing you can do is drink enough fluid. You should drink enough fluid to keep your urine clear or pale yellow.  Ask your health care provider or dietitian how much protein from animal sources you should eat each day, and also how much salt and calcium you should have each day. This information is not intended to replace advice given to you by your health care provider. Make sure you discuss any questions you have with your health care provider. Document Released: 08/15/2010 Document Revised: 03/31/2016 Document Reviewed: 03/31/2016 Elsevier Interactive Patient Education  2017 [employer]vier Inc[employer]  

## 2016-07-23 NOTE — Progress Notes (Signed)
07/23/2016 8:26 AM   Marlou Sa Burkitt 1986-04-29 [id number]  Referring provider: Judy Pimple, MD [address] [place], [place] [address]  Chief Complaint  Patient presents with  . Routine Post Op    2wk post w/KUB    HPI: 31 year old female with a cluster of left UPJ stones measuring proximally 9 mm as well as several nonobstructing left lower pole stones 2 weeks status post shockwave lithotripsy treatment of her UPJ stones.  KUB today shows no residual stone burden at the UPJ or within the ureter.  She reports that she had 2 days of flank pain which resolved and passed a number of fragments which she brings with her today. She's had no pain since. No urinary symptoms today.  She had been struggling with intermittent flank pain on the left side specifically since at least 10-13-15. She has passed all of her previous stones spontaneously and not required to be surgical intervention. She's never seen a urologist in the past. She is passed a total of 3 stones every several years.  Her first kidney stone was around the age of 72. She is a strong family history of kidney stones and her parents as well as her sibling.  She does admit to eating too much salt not drinking enough water.  She notes today that she is hoping to become pregnant within the next year and stopped taking birth control.  PMH: Past Medical History:  Diagnosis Date  . Concussion   . History of depression   . Kidney infection   . Kidney stone   . Migraine     Surgical History: Past Surgical History:  Procedure Laterality Date  . APPENDECTOMY    . EXTRACORPOREAL SHOCK WAVE LITHOTRIPSY Left 07/09/2016   Procedure: EXTRACORPOREAL SHOCK WAVE LITHOTRIPSY (ESWL);  Surgeon: Vanna Scotland, MD;  Location: [facility] ORS;  Service: Urology;  Laterality: Left;    Home Medications:  Allergies as of 07/23/2016   No Known Allergies     Medication List       Accurate as of 07/23/16 11:59 PM. Always use your most recent  med list.          albuterol 108 (90 Base) MCG/ACT inhaler Commonly known as:  PROVENTIL HFA;VENTOLIN HFA Inhale 2 puffs into the lungs every 4 (four) hours as needed for wheezing or shortness of breath.   ALPRAZolam 0.5 MG tablet Commonly known as:  XANAX TAKE 1 TABLET BY MOUTH TWICE DAILY AS NEEDED FOR ANXIETY OR SLEEP   ibuprofen 200 MG tablet Commonly known as:  ADVIL,MOTRIN Take 200 mg by mouth every 6 (six) hours as needed for moderate pain.   ondansetron 4 MG tablet Commonly known as:  ZOFRAN Take 1 tablet (4 mg total) by mouth daily as needed for nausea or vomiting.   SUMAtriptan 100 MG tablet Commonly known as:  IMITREX TAKE 1 TABLET BY MOUTH EVERY 2 HOURS AS NEEDED FOR MIGRAINE   valACYclovir 1000 MG tablet Commonly known as:  VALTREX TAKE 2 TABLETS BY MOUTH 2 TIMES A DAY FOR 1 DAY       Allergies: No Known Allergies  Family History: Family History  Problem Relation Age of Onset  . Alcohol abuse Maternal Uncle   . Hyperlipidemia Mother   . Hypertension Mother   . Heart disease Mother   . Hyperlipidemia Father   . Hypertension Father   . Diabetes Cousin   . Bladder Cancer Neg Hx   . Kidney cancer Neg Hx  Social History:  reports that she has quit smoking. Her smoking use included Cigarettes. She quit after 6.00 years of use. She has never used smokeless tobacco. She reports that she drinks alcohol. She reports that she does not use drugs.  ROS: UROLOGY Frequent Urination?: No Hard to postpone urination?: No Burning/pain with urination?: No Get up at night to urinate?: No Leakage of urine?: Yes Urine stream starts and stops?: No Trouble starting stream?: No Do you have to strain to urinate?: No Blood in urine?: No Urinary tract infection?: No Sexually transmitted disease?: No Injury to kidneys or bladder?: No Painful intercourse?: No Weak stream?: No Currently pregnant?: No Vaginal bleeding?: No Last menstrual period?:  n  Gastrointestinal Nausea?: No Vomiting?: No Indigestion/heartburn?: No Diarrhea?: No Constipation?: No  Constitutional Fever: No Night sweats?: No Weight loss?: No Fatigue?: No  Skin Skin rash/lesions?: No Itching?: No  Eyes Blurred vision?: No Double vision?: No  Ears/Nose/Throat Sore throat?: No Sinus problems?: No  Hematologic/Lymphatic Swollen glands?: No Easy bruising?: No  Cardiovascular Leg swelling?: No Chest pain?: No  Respiratory Cough?: No Shortness of breath?: No  Endocrine Excessive thirst?: No  Musculoskeletal Back pain?: No Joint pain?: No  Neurological Headaches?: No Dizziness?: No  Psychologic Depression?: No Anxiety?: No  Physical Exam: BP 107/74   Pulse 92   Ht 5\' 1"  (1.549 m)   Wt 218 lb (98.9 kg)   LMP 07/02/2016   BMI 41.19 kg/m   Constitutional:  Alert and oriented, No acute distress. HEENT: [place] AT, moist mucus membranes.  Trachea midline, no masses. Cardiovascular: No clubbing, cyanosis, or edema. Respiratory: Normal respiratory effort, no increased work of breathing. GI: Abdomen is soft, nontender, nondistended, no abdominal masses GU: No CVA tenderness.  Skin: No rashes, bruises or suspicious lesions.. Neurologic: Grossly intact, no focal deficits, moving all 4 extremities. Psychiatric: Normal mood and affect.  Laboratory Data: Lab Results  Component Value Date   WBC 10.3 07/06/2016   HGB 12.7 07/06/2016   HCT 36.8 07/06/2016   MCV 83.2 07/06/2016   PLT 400 07/06/2016    Lab Results  Component Value Date   CREATININE 0.58 07/06/2016    Urinalysis n/a  Pertinent Imaging: CLINICAL DATA:  LEFT side kidney stones and lithotripsy 2 weeks ago  EXAM: ABDOMEN - 1 VIEW  COMPARISON:  07/06/2016  FINDINGS: No urinary tract calcification evident.  Previous identified LEFT UPJ calculus is no longer seen.  Bowel gas pattern normal.  Bones unremarkable.  IMPRESSION: No urinary tract  calcification identified.   Electronically Signed   By: Ulyses Southward M.D.   On: 07/23/2016 08:58  KUB he presents today with the patient. This was compared to her previous CT scan.  Assessment & Plan:   1. Left nephrolithiasis s/p  successful ESWL for treatment of her left UPJ stones  She does have 2 smaller nonobstructing lower pole stone seen on her CT scan and previous x-ray imaging, unlikely to changed in size and position following shockwave treatment of these were not treated  Have offered her intervention for these stones and discussed the risks and benefits including how this may affect her upcoming pregnancy. She would like to observe these for the time being.  Recommend follow-up in 6 months with KUB if not pregnant, otherwise will follow-up after her pregnancy.  - DG Abd 1 View; Future  2. Recurrent nephrolithiasis We discussed general stone prevention techniques including drinking plenty water with goal of producing 2.5 L urine daily, increased citric acid intake, avoidance  of high oxalate containing foods, and decreased salt intake.  Information about dietary recommendations given today.   Offered a 24-hour urine metabolic workup but declined, will consider for the future if continues to recurrent stone episodes   Return in about 6 months (around 01/23/2017) for KUB.  Vanna ScotlandAshley Lillion Elbert, MD  [facility] [address], [address] [place], [place] [id number] [phone]

## 2016-08-03 ENCOUNTER — Other Ambulatory Visit: Payer: Self-pay | Admitting: Urology

## 2016-08-03 ENCOUNTER — Other Ambulatory Visit: Payer: Self-pay | Admitting: Family Medicine

## 2016-08-03 NOTE — Telephone Encounter (Signed)
Px written for call in   

## 2016-08-03 NOTE — Telephone Encounter (Signed)
Xanax last filled on 05/25/16 #30 tab with 0 refills, imitrex last filled on 01/08/16 #10 tabs with 5 additional refills, pt had CPE on 02/28/16, please advise

## 2016-08-04 NOTE — Telephone Encounter (Signed)
Rx called in as prescribed 

## 2016-09-02 ENCOUNTER — Other Ambulatory Visit: Payer: Self-pay | Admitting: Family Medicine

## 2016-09-02 NOTE — Telephone Encounter (Signed)
Hospital filled Rx on 07/06/16 #20 tabs with 1 additional refill, please advise

## 2016-09-02 NOTE — Telephone Encounter (Signed)
Please check in with her to see how she is doing with her kidney stones - does she feel like she is passing more?  She sees urology  Refill times one   Thanks

## 2016-09-03 NOTE — Telephone Encounter (Signed)
Pt said she has been dealing with kidney stones and has had bit of a rough time with them, the urologist did a lithotripsy on her and 2 weeks after that she had a f/u xray that showed 2 more kidney stones. She had a f/u appt with the urologist who advise her on some diet changes and increase water intake to try to help with theses kidney stones she is getting. Pt said she has a f/u with the urologist soon and they are going to recheck the kidney stones then, if they are not really bothering pt they may just leave them alone for now but if they start causing sxs they advise her she may have to get another lithotripsy done. Pt did want to let Dr. Milinda Antisower know that she appreciates her checking in on her.  Med filled

## 2016-09-22 ENCOUNTER — Encounter: Payer: Self-pay | Admitting: Family Medicine

## 2016-09-22 ENCOUNTER — Ambulatory Visit (INDEPENDENT_AMBULATORY_CARE_PROVIDER_SITE_OTHER): Payer: 59 | Admitting: Family Medicine

## 2016-09-22 VITALS — BP 112/74 | HR 90 | Temp 98.1°F | Ht 61.0 in | Wt 218.8 lb

## 2016-09-22 DIAGNOSIS — Z3169 Encounter for other general counseling and advice on procreation: Secondary | ICD-10-CM | POA: Insufficient documentation

## 2016-09-22 DIAGNOSIS — L03116 Cellulitis of left lower limb: Secondary | ICD-10-CM | POA: Diagnosis not present

## 2016-09-22 DIAGNOSIS — L0390 Cellulitis, unspecified: Secondary | ICD-10-CM | POA: Insufficient documentation

## 2016-09-22 MED ORDER — DOXYCYCLINE HYCLATE 100 MG PO TABS: 100.0000 mg | ORAL_TABLET | Freq: Two times a day (BID) | ORAL | 0 refills | Status: DC

## 2016-09-22 NOTE — Patient Instructions (Signed)
Keep the wound area clean with soap and water  When able - elevate it and use a warm compress on it  Ibuprofen for fever or discomfort  Take the doxycycline as directed  Watch for increased redness/ pain/ warmth or rash  If swelling goes up despite elevation also let us know

## 2016-09-22 NOTE — Progress Notes (Signed)
Subjective:    Patient ID: Yvonne Trujillo, female    DOB: December 05, 1985, 31 y.o.   MRN: [id number]  HPI Here for a possible insect bite on L foot - unsure  No injury or trauma known   Sunday am her ankle was sore /tender and warm  A scab is evident -no bruising No pus or drainage  Not itchy- just sore   Had a fever last night 99.2 Sweating at 4 am  Chills this am   Last took ibuprofen yesterday  Temp: 98.1 F (36.7 C)   Had a headache yesterday (not typical migraine -it went away)   No rash  No ticks seen   No sore throat No abdominal pain   No direct contact with mrsa    Is interested in getting pregnant   Patient Active Problem List   Diagnosis Date Noted  . Cellulitis 09/22/2016  . Pre-conception counseling 09/22/2016  . Herpes simplex 01/18/2015  . Encounter for routine gynecological examination 06/12/2013  . Screen for STD (sexually transmitted disease) 06/12/2013  . Leukocytosis, unspecified 06/12/2013  . Hyperlipidemia 04/25/2013  . Migraine 04/24/2013  . Depression with anxiety 04/24/2013  . Hirsutism 04/24/2013  . Acne 04/24/2013  . History of kidney stones 04/24/2013  . Screening for diabetes mellitus 04/24/2013  . Routine general medical examination at a health care facility 04/24/2013   Past Medical History:  Diagnosis Date  . Concussion   . History of depression   . Kidney infection   . Kidney stone   . Migraine    Past Surgical History:  Procedure Laterality Date  . APPENDECTOMY    . EXTRACORPOREAL SHOCK WAVE LITHOTRIPSY Left 07/09/2016   Procedure: EXTRACORPOREAL SHOCK WAVE LITHOTRIPSY (ESWL);  Surgeon: Vanna Scotland, MD;  Location: [facility] ORS;  Service: Urology;  Laterality: Left;   Social History  Substance Use Topics  . Smoking status: Former Smoker    Years: 6.00    Types: Cigarettes  . Smokeless tobacco: Never Used  . Alcohol use 0.0 oz/week     Comment: socially   Family History  Problem Relation Age of Onset  . Alcohol abuse  Maternal Uncle   . Hyperlipidemia Mother   . Hypertension Mother   . Heart disease Mother   . Hyperlipidemia Father   . Hypertension Father   . Diabetes Cousin   . Bladder Cancer Neg Hx   . Kidney cancer Neg Hx    No Known Allergies Current Outpatient Prescriptions on File Prior to Visit  Medication Sig Dispense Refill  . albuterol (PROVENTIL HFA;VENTOLIN HFA) 108 (90 Base) MCG/ACT inhaler Inhale 2 puffs into the lungs every 4 (four) hours as needed for wheezing or shortness of breath. 1 Inhaler 3  . ALPRAZolam (XANAX) 0.5 MG tablet TAKE 1 TABLET BY MOUTH TWICE DAILY AS NEEDED FOR ANXIETY/SLEEP 30 tablet 0  . ibuprofen (ADVIL,MOTRIN) 200 MG tablet Take 200 mg by mouth every 6 (six) hours as needed for moderate pain.     Marland Kitchen ondansetron (ZOFRAN) 4 MG tablet TAKE 1 TABLET BY MOUTH EVERY 8 HOURS AS NEEDED FOR NAUSEA/VOMITING 90 tablet 0  . SUMAtriptan (IMITREX) 100 MG tablet TAKE 1 TABLET BY MOUTH EVERY 2 HOURS AS NEEDED FOR MIGRAINE 10 tablet 5  . valACYclovir (VALTREX) 1000 MG tablet TAKE 2 TABLETS BY MOUTH 2 TIMES A DAY FOR 1 DAY 20 tablet 0   No current facility-administered medications on file prior to visit.     Review of Systems Review of Systems  Constitutional:  Negative for fever, appetite change, fatigue and unexpected weight change.  Eyes: Negative for pain and visual disturbance.  Respiratory: Negative for cough and shortness of breath.   Cardiovascular: Negative for cp or palpitations    Gastrointestinal: Negative for nausea, diarrhea and constipation.  Genitourinary: Negative for urgency and frequency.  Skin: Negative for pallor or rash  pos for red swollen area over L ankle Neurological: Negative for weakness, light-headedness, numbness and headaches.  Hematological: Negative for adenopathy. Does not bruise/bleed easily.  Psychiatric/Behavioral: Negative for dysphoric mood. The patient is not nervous/anxious.         Objective:   Physical Exam  Constitutional: She  appears well-developed and well-nourished. No distress.  obese and well appearing   Eyes: Conjunctivae and EOM are normal. Pupils are equal, round, and reactive to light. Right eye exhibits no discharge. Left eye exhibits no discharge. No scleral icterus.  Neck: Normal range of motion. Neck supple.  Cardiovascular: Normal rate, regular rhythm and normal heart sounds.   Pulmonary/Chest: Effort normal and breath sounds normal. No respiratory distress. She has no wheezes. She has no rales.  Musculoskeletal: She exhibits edema.  Mild puffiness of skin of L lateral ankle over malleolus   Lymphadenopathy:    She has no cervical adenopathy.  Neurological: She is alert. She exhibits normal muscle tone. Coordination normal.  Skin: Skin is warm and dry. No rash noted.  0.5 cm healing scab over L lat malleolus  Mildly tender and puffy  No bony tenderness  Scant <1 cm of redness around scab, no streaking  Nl vascular exam    Psychiatric: She has a normal mood and affect.          Assessment & Plan:   Problem List Items Addressed This Visit      Other   Cellulitis    Small area of induration/erythema on L lateral ankle with scab  ? If caused by insect No tick seen  Had a low grade temp yesterday  Cover with doxycycline  See avs for sympt care Update if not starting to improve in a week or if worsening        Pre-conception counseling    Pt plans to try for pregnancy once she has the cellulitis cleared up  Recommend healthy diet/minimal caffeine Avoid etoh and otc meds Rev med list (px) for preg indication Will begin otc pnv Recommend "what to expect" book  Alert with late menses or other symptoms

## 2016-09-22 NOTE — Assessment & Plan Note (Signed)
Pt plans to try for pregnancy once she has the cellulitis cleared up  Recommend healthy diet/minimal caffeine Avoid etoh and otc meds Rev med list (px) for preg indication Will begin otc pnv Recommend "what to expect" book  Alert with late menses or other symptoms

## 2016-09-22 NOTE — Assessment & Plan Note (Signed)
Small area of induration/erythema on L lateral ankle with scab  ? If caused by insect No tick seen  Had a low grade temp yesterday  Cover with doxycycline  See avs for sympt care Update if not starting to improve in a week or if worsening

## 2016-11-02 ENCOUNTER — Telehealth: Payer: Self-pay | Admitting: *Deleted

## 2016-11-02 NOTE — Telephone Encounter (Signed)
Pt left voicemail at Triage requesting a refill of Augmentin and prednisone 20 mg. Pt said she feels like she has a sinus infection and wanted Rxs filled instead of coming in for an appt

## 2016-11-02 NOTE — Telephone Encounter (Signed)
Pt notified and she has an ENT that she has been seeing so she will call them to get an appt

## 2016-11-02 NOTE — Telephone Encounter (Signed)
Cannot px those without evaluation - I am not in the office today

## 2017-01-22 ENCOUNTER — Ambulatory Visit: Payer: Self-pay | Admitting: Urology

## 2017-01-23 ENCOUNTER — Other Ambulatory Visit: Payer: Self-pay | Admitting: Family Medicine

## 2017-01-25 NOTE — Telephone Encounter (Signed)
Last Rx 08/03/2016. Last f/u OV 02/2016. pls advise

## 2017-01-25 NOTE — Telephone Encounter (Signed)
Rx called in to requested pharmacy 

## 2017-01-25 NOTE — Telephone Encounter (Signed)
Px written for call in   

## 2017-02-04 ENCOUNTER — Other Ambulatory Visit: Payer: Self-pay

## 2017-02-04 DIAGNOSIS — B001 Herpesviral vesicular dermatitis: Secondary | ICD-10-CM

## 2017-02-04 MED ORDER — VALACYCLOVIR HCL 1 G PO TABS: ORAL_TABLET | ORAL | 1 refills | Status: DC

## 2017-02-04 NOTE — Telephone Encounter (Signed)
done

## 2017-02-04 NOTE — Telephone Encounter (Signed)
Pt left v/m requesting refill valtrex to [facility] pharmacy. Last refilled # 20 on 01/18/15; problem list for 01/18/15 is herpes simplex. Last annual 02/28/16; no future appt scheduled.

## 2017-02-04 NOTE — Telephone Encounter (Signed)
Please refill times one  

## 2017-02-09 ENCOUNTER — Ambulatory Visit: Payer: Self-pay | Admitting: Urology

## 2017-04-28 ENCOUNTER — Other Ambulatory Visit: Payer: Self-pay | Admitting: Family Medicine

## 2017-04-28 NOTE — Telephone Encounter (Signed)
No recent or future f/u or CPE, last acute appt was 09/22/16, please advise

## 2017-05-04 NOTE — L&D Delivery Note (Addendum)
Delivery Note At 8:40 AM a viable female was delivered via Vaginal, Spontaneous (Presentation: cephalic; LOA ).  APGAR: 9, 9; weight  .   Placenta status: Spontaneous, intact.  Cord: 3VC    Anesthesia:  Epidural Episiotomy: None Lacerations: 2nd degree;Perineal;Periurethral Suture Repair: 3-0 vicryl  4-0 vicryl rapide Est. Blood Loss (mL):  400  Mom to postpartum.  Baby to Couplet care / Skin to Skin.  Upon arrival patient was complete and pushing. She pushed with good maternal effort to deliver a healthy baby boy. Baby delivered without difficulty, was noted to have good tone and place on maternal abdomen for oral suctioning, drying and stimulation. Delayed cord clamping performed. Placenta delivered intact with 3V cord. Vaginal canal and perineum was inspected and found to have a second degree laceration which was repaired with a 3-0 vicryl and a periurethral laceration that was repaired with one simple interrupted suture of 4-0 vicryl rapide . Following repair, the peritoneum was hemostatic. Pitocin was started and uterus massaged until bleeding slowed. Counts of sharps, instruments, and lap pads were all correct.   Mirian MoPeter Frank, MD PGY-1 01/16/2018, 9:58 AM  Attestation: I have seen this patient and agree with the resident's documentation. I was present during delivery and assisted with the repair.   Cristal DeerLaurel S. Earlene PlaterWallace, DO OB/GYN Fellow

## 2017-05-17 ENCOUNTER — Telehealth: Payer: Self-pay | Admitting: Family Medicine

## 2017-05-17 DIAGNOSIS — Z3201 Encounter for pregnancy test, result positive: Secondary | ICD-10-CM | POA: Insufficient documentation

## 2017-05-17 NOTE — Telephone Encounter (Signed)
Copied from CRM [id number]. Topic: Referral - Request >> May 17, 2017  8:48 AM Louie BunPalacios Medina, Rosey Batheresa D wrote: Reason for CRM: Patient called and would like to talk to Dr. Milinda Antisower about a referral to go and see Ob-Gyn. Please call patient back, thanks.

## 2017-05-17 NOTE — Telephone Encounter (Signed)
Referral done  Will send to [facility] 

## 2017-05-17 NOTE — Telephone Encounter (Signed)
I spoke with pt; pt has positive pregnancy test and request referral to OB; last menstrual period 04/14/17.pt wants female physician.Please advise.

## 2017-05-18 ENCOUNTER — Emergency Department
Admission: EM | Admit: 2017-05-18 | Discharge: 2017-05-18 | Disposition: A | Discharge status: Home / Self Care | Payer: 59 | Attending: Emergency Medicine | Admitting: Emergency Medicine

## 2017-05-18 ENCOUNTER — Emergency Department: Payer: 59

## 2017-05-18 DIAGNOSIS — R000 Tachycardia, unspecified: Secondary | ICD-10-CM | POA: Insufficient documentation

## 2017-05-18 DIAGNOSIS — Z3A01 Less than 8 weeks gestation of pregnancy: Secondary | ICD-10-CM | POA: Insufficient documentation

## 2017-05-18 DIAGNOSIS — R0602 Shortness of breath: Secondary | ICD-10-CM | POA: Insufficient documentation

## 2017-05-18 DIAGNOSIS — R059 Cough, unspecified: Secondary | ICD-10-CM

## 2017-05-18 DIAGNOSIS — Z3201 Encounter for pregnancy test, result positive: Secondary | ICD-10-CM | POA: Diagnosis not present

## 2017-05-18 DIAGNOSIS — J45909 Unspecified asthma, uncomplicated: Secondary | ICD-10-CM | POA: Diagnosis not present

## 2017-05-18 DIAGNOSIS — O99519 Diseases of the respiratory system complicating pregnancy, unspecified trimester: Secondary | ICD-10-CM | POA: Diagnosis not present

## 2017-05-18 DIAGNOSIS — Z79899 Other long term (current) drug therapy: Secondary | ICD-10-CM | POA: Insufficient documentation

## 2017-05-18 DIAGNOSIS — J40 Bronchitis, not specified as acute or chronic: Secondary | ICD-10-CM | POA: Diagnosis not present

## 2017-05-18 DIAGNOSIS — Z87891 Personal history of nicotine dependence: Secondary | ICD-10-CM | POA: Diagnosis not present

## 2017-05-18 DIAGNOSIS — R05 Cough: Secondary | ICD-10-CM

## 2017-05-18 HISTORY — DX: Unspecified asthma, uncomplicated: J45.909

## 2017-05-18 LAB — BASIC METABOLIC PANEL
ANION GAP: 8 (ref 5–15)
BUN: 8 mg/dL (ref 6–20)
CO2: 22 mmol/L (ref 22–32)
Calcium: 8.4 mg/dL — ABNORMAL LOW (ref 8.9–10.3)
Chloride: 103 mmol/L (ref 101–111)
Creatinine, Ser: 0.61 mg/dL (ref 0.44–1.00)
GFR calc Af Amer: >60 mL/min (ref >60)
GLUCOSE: 113 mg/dL — AB (ref 65–99)
POTASSIUM: 3.6 mmol/L (ref 3.5–5.1)
Sodium: 133 mmol/L — ABNORMAL LOW (ref 135–145)

## 2017-05-18 LAB — CBC
HEMATOCRIT: 34.7 % — AB (ref 35.0–47.0)
HEMOGLOBIN: 11.9 g/dL — AB (ref 12.0–16.0)
MCH: 28.3 pg (ref 26.0–34.0)
MCHC: 34.4 g/dL (ref 32.0–36.0)
MCV: 82.3 fL (ref 80.0–100.0)
Platelets: 456 10*3/uL — ABNORMAL HIGH (ref 150–440)
RBC: 4.21 MIL/uL (ref 3.80–5.20)
RDW: 12.9 % (ref 11.5–14.5)
WBC: 13.9 10*3/uL — ABNORMAL HIGH (ref 3.6–11.0)

## 2017-05-18 LAB — POCT PREGNANCY, URINE: PREG TEST UR: POSITIVE — AB

## 2017-05-18 LAB — TROPONIN I: Troponin I: <0.03 ng/mL (ref <0.03)

## 2017-05-18 MED ORDER — SODIUM CHLORIDE 0.9 % IV BOLUS (SEPSIS)
1000.0000 mL | Freq: Once | INTRAVENOUS | Status: AC
  Administered 2017-05-18: 1000 mL via INTRAVENOUS

## 2017-05-18 MED ORDER — ALBUTEROL SULFATE HFA 108 (90 BASE) MCG/ACT IN AERS: 2.0000 | INHALATION_SPRAY | Freq: Four times a day (QID) | RESPIRATORY_TRACT | 0 refills | Status: DC | PRN

## 2017-05-18 MED ORDER — ACETAMINOPHEN 500 MG PO TABS
1000.0000 mg | ORAL_TABLET | Freq: Once | ORAL | Status: AC
  Administered 2017-05-18: 1000 mg via ORAL
  Filled 2017-05-18: qty 2

## 2017-05-18 MED ORDER — IPRATROPIUM-ALBUTEROL 0.5-2.5 (3) MG/3ML IN SOLN
3.0000 mL | Freq: Once | RESPIRATORY_TRACT | Status: AC
  Administered 2017-05-18: 3 mL via RESPIRATORY_TRACT
  Filled 2017-05-18: qty 3

## 2017-05-18 NOTE — ED Provider Notes (Addendum)
This patient with an antiemetic Yvonne Trujillo. 32 year old female, [redacted] weeks pregnant, presenting cough and cold symptoms most consistent with a viral syndrome or bronchitis. On arrival to the emergency department, the patient was given a breathing treatment. Here, she has been tachycardic after her breathing treatment. Her chest x-ray does not show any acute infiltrate. I have reexamined the patient myself, and her wheezing has completely resolved. She does continue to have intermittent cough. I rechecked her temperature myself as well, and she continues to be afebrile. At this time, we'll treat the patient with Tylenol for cough related chest discomfort, and a liter of intravenous fluids. Plan reevaluation for final disposition.  ----------------------------------------- 9:32 AM on 05/18/2017 -----------------------------------------  Reevaluated the patient his repeat heart rate is 99. She continues to cough but is otherwise feeling better after Tylenol. At discussed return precautions as well as follow-up instructions with the patient who is safe for discharge at this time.   Rockne MenghiniNorman, Anne-Caroline, MD 05/18/17 [id number]    Rockne MenghiniNorman, Anne-Caroline, MD 05/18/17 [phone]

## 2017-05-18 NOTE — ED Notes (Addendum)
Pt verbalizes d/c understanding, follow up, and RX. Pt in NAD at time of d/c,, VS stable, pt ambulatory with family upon departure . Pt signed paper copy form of d/c due to topax malfnx

## 2017-05-18 NOTE — ED Triage Notes (Signed)
Patient c/o SOB, cough, chest pain beginning Thursday. Patient was seen at urgent care Saturday and tested for flu and strep - both negative.

## 2017-05-18 NOTE — Discharge Instructions (Signed)
Please follow up with your primary care physician or your OB/ GYN. You may take robitussin over the counter for your cough

## 2017-05-18 NOTE — ED Notes (Signed)
Pt with a congested cough x several days without improvement. Pt is also pregnant

## 2017-05-18 NOTE — ED Triage Notes (Signed)
Patient took home pregnancy test on Sunday - tested positive. Not confirmed by MD

## 2017-05-18 NOTE — ED Provider Notes (Addendum)
[facility] Emergency Department Provider Note   ____________________________________________   First MD Initiated Contact with Patient 05/18/17 [phone]     (approximate)  I have reviewed the triage vital signs and the nursing notes.   HISTORY  Chief Complaint Shortness of Breath and Cough    HPI Yvonne Trujillo is a 32 y.o. female who comes into the hospital today with a cough.  The patient states that she has had this cough for the past 5 days.  She went to [facility] on Saturday and reports that she was tested for flu and strep.  They were both negative.  She reports though that she is still been having this cough.  It is a deep dry cough that is nonproductive.  She has had some shortness of breath as well.  The patient has tried using a humidifier, cough drops and Vicks VapoRub with no relief.  The patient is pregnant so there is not a whole lot that she can use.  She tried to call her OB/GYN to see if she can get an appointment but she is unable to get one.  The patient has been unable to sleep.  She decided to come into the hospital today for evaluation.  She denies any fevers.   Past Medical History:  Diagnosis Date  . Asthma   . Concussion   . History of depression   . Kidney infection   . Kidney stone   . Migraine     Patient Active Problem List   Diagnosis Date Noted  . Positive pregnancy test 05/17/2017  . Cellulitis 09/22/2016  . Pre-conception counseling 09/22/2016  . Herpes simplex 01/18/2015  . Encounter for routine gynecological examination 06/12/2013  . Screen for STD (sexually transmitted disease) 06/12/2013  . Leukocytosis, unspecified 06/12/2013  . Hyperlipidemia 04/25/2013  . Migraine 04/24/2013  . Depression with anxiety 04/24/2013  . Hirsutism 04/24/2013  . Acne 04/24/2013  . History of kidney stones 04/24/2013  . Screening for diabetes mellitus 04/24/2013  . Routine general medical examination at a health care facility  04/24/2013    Past Surgical History:  Procedure Laterality Date  . APPENDECTOMY    . EXTRACORPOREAL SHOCK WAVE LITHOTRIPSY Left 07/09/2016   Procedure: EXTRACORPOREAL SHOCK WAVE LITHOTRIPSY (ESWL);  Surgeon: Vanna Scotland, MD;  Location: [facility] ORS;  Service: Urology;  Laterality: Left;    Prior to Admission medications   Medication Sig Start Date End Date Taking? Authorizing Provider  albuterol (PROVENTIL HFA;VENTOLIN HFA) 108 (90 Base) MCG/ACT inhaler Inhale 2 puffs into the lungs every 4 (four) hours as needed for wheezing or shortness of breath. 07/30/15   Tower, Audrie Gallus, MD  albuterol (PROVENTIL HFA;VENTOLIN HFA) 108 (90 Base) MCG/ACT inhaler Inhale 2 puffs into the lungs every 6 (six) hours as needed. 05/18/17   Rebecka Apley, MD  ALPRAZolam Prudy Feeler) 0.5 MG tablet TAKE 1 TABLET BY MOUTH TWICE DAILY AS NEEDED FOR ANXIETY/SLEEP 01/25/17   Tower, Audrie Gallus, MD  doxycycline (VIBRA-TABS) 100 MG tablet Take 1 tablet (100 mg total) by mouth 2 (two) times daily. With non dairy food 09/22/16   Tower, Audrie Gallus, MD  ibuprofen (ADVIL,MOTRIN) 200 MG tablet Take 200 mg by mouth every 6 (six) hours as needed for moderate pain.     [provider]  ondansetron (ZOFRAN) 4 MG tablet TAKE 1 TABLET BY MOUTH EVERY 8 HOURS AS NEEDED FOR NAUSEA/VOMITING 04/28/17   Tower, Audrie Gallus, MD  SUMAtriptan (IMITREX) 100 MG tablet TAKE 1 TABLET  BY MOUTH EVERY 2 HOURS AS NEEDED FOR MIGRAINE 08/03/16   Tower, Audrie GallusMarne A, MD  valACYclovir (VALTREX) 1000 MG tablet TAKE 2 TABLETS BY MOUTH 2 TIMES A DAY FOR 1 DAY 02/04/17   Tower, Audrie GallusMarne A, MD    Allergies Patient has no known allergies.  Family History  Problem Relation Age of Onset  . Alcohol abuse Maternal Uncle   . Hyperlipidemia Mother   . Hypertension Mother   . Heart disease Mother   . Hyperlipidemia Father   . Hypertension Father   . Diabetes Cousin   . Bladder Cancer Neg Hx   . Kidney cancer Neg Hx     Social History Social History   Tobacco Use  .  Smoking status: Former Smoker    Years: 6.00    Types: Cigarettes  . Smokeless tobacco: Never Used  Substance Use Topics  . Alcohol use: Yes    Alcohol/week: 0.0 oz    Comment: socially  . Drug use: No    Review of Systems  Constitutional: No fever/chills Eyes: No visual changes. ENT: No sore throat. Cardiovascular: Denies chest pain. Respiratory: cough and shortness of breath. Gastrointestinal: No abdominal pain.  No nausea, no vomiting.  No diarrhea.  No constipation. Genitourinary: Negative for dysuria. Musculoskeletal: Negative for back pain. Skin: Negative for rash. Neurological: Negative for headaches, focal weakness or numbness.   ____________________________________________   PHYSICAL EXAM:  VITAL SIGNS: ED Triage Vitals  Enc Vitals Group     BP 05/18/17 0121 119/75     Pulse Rate 05/18/17 0121 (!) 119     Resp 05/18/17 0121 (!) 23     Temp 05/18/17 0121 98.2 F (36.8 C)     Temp Source 05/18/17 0121 Oral     SpO2 05/18/17 0121 98 %     Weight 05/18/17 0122 215 lb (97.5 kg)     Height 05/18/17 0122 5\' 1"  (1.549 m)     Head Circumference --      Peak Flow --      Pain Score 05/18/17 0121 7     Pain Loc --      Pain Edu? --      Excl. in GC? --     Constitutional: Alert and oriented. Well appearing and in mild distress. Eyes: Conjunctivae are normal. PERRL. EOMI. Head: Atraumatic. Nose: No congestion/rhinnorhea. Mouth/Throat: Mucous membranes are moist.  Oropharynx non-erythematous. Cardiovascular: Normal rate, regular rhythm. Grossly normal heart sounds.  Good peripheral circulation. Respiratory: Normal respiratory effort.  No retractions. Mild end expiratory wheezing. Gastrointestinal: Soft and nontender. No distention. Positive bowel sounds Musculoskeletal: No lower extremity tenderness nor edema.   Neurologic:  Normal speech and language.  Skin:  Skin is warm, dry and intact.  Psychiatric: Mood and affect are normal.    ____________________________________________   LABS (all labs ordered are listed, but only abnormal results are displayed)  Labs Reviewed  BASIC METABOLIC PANEL - Abnormal; Notable for the following components:      Result Value   Sodium 133 (*)    Glucose, Bld 113 (*)    Calcium 8.4 (*)    All other components within normal limits  CBC - Abnormal; Notable for the following components:   WBC 13.9 (*)    Hemoglobin 11.9 (*)    HCT 34.7 (*)    Platelets 456 (*)    All other components within normal limits  POCT PREGNANCY, URINE - Abnormal; Notable for the following components:   Preg Test, Ur POSITIVE (*)  All other components within normal limits  TROPONIN I  POC URINE PREG, ED   ____________________________________________  EKG  ED ECG REPORT I, Rebecka Apley, the attending physician, personally viewed and interpreted this ECG.   Date: 05/18/2017  EKG Time: 122  Rate: 104  Rhythm: sinus tachycardia  Axis: normal  Intervals:none  ST&T Change: none  ____________________________________________  RADIOLOGY  Dg Chest 2 View  Result Date: 05/18/2017 CLINICAL DATA:  32 year old female with chest pain. EXAM: CHEST  2 VIEW COMPARISON:  Chest CT dated 02/27/2014 FINDINGS: The heart size and mediastinal contours are within normal limits. Both lungs are clear. The visualized skeletal structures are unremarkable. IMPRESSION: No active cardiopulmonary disease. Electronically Signed   By: Elgie Collard M.D.   On: 05/18/2017 02:19    ____________________________________________   PROCEDURES  Procedure(s) performed: None  Procedures  Critical Care performed: No  ____________________________________________   INITIAL IMPRESSION / ASSESSMENT AND PLAN / ED COURSE  As part of my medical decision making, I reviewed the following data within the electronic MEDICAL RECORD NUMBER Notes from prior ED visits and [place] Controlled Substance Database   This is a  32 year old female who is pregnant who comes into the hospital today with a cough for the last 5 days.  My differential diagnosis includes bronchitis, viral upper respiratory infection, pneumonia  The patient is pregnant but she did receive a chest x-ray.  The patient's chest x-ray did not show any pneumonia.  I will give the patient a DuoNeb treatment as she does have some mild wheezing.  I did initially plan to give the patient prednisone but as it is not recommended in first trimester pregnancy I did not give the medication.  I did recommend for the patient to try Robitussin and she does need to follow-up with her OB/GYN.     The patient was tachycardic so I did give her a liter of normal saline.  She will be reassessed by the oncoming physician for improvement in her tachycardia and then she will be dispositioned home.  I feel that the patient does have some bronchitis. ____________________________________________   FINAL CLINICAL IMPRESSION(S) / ED DIAGNOSES  Final diagnoses:  Cough  Bronchitis     ED Discharge Orders        Ordered    albuterol (PROVENTIL HFA;VENTOLIN HFA) 108 (90 Base) MCG/ACT inhaler  Every 6 hours PRN     05/18/17 0826       Note:  This document was prepared using Dragon voice recognition software and may include unintentional dictation errors.    Rebecka Apley, MD 05/18/17 1610    Rebecka Apley, MD 05/18/17 0830

## 2017-05-19 ENCOUNTER — Ambulatory Visit: Payer: 59 | Admitting: Family Medicine

## 2017-05-19 ENCOUNTER — Encounter: Payer: Self-pay | Admitting: Family Medicine

## 2017-05-19 ENCOUNTER — Ambulatory Visit: Payer: Self-pay | Admitting: *Deleted

## 2017-05-19 VITALS — BP 116/80 | HR 104 | Temp 97.9°F | Wt 219.0 lb

## 2017-05-19 DIAGNOSIS — B9789 Other viral agents as the cause of diseases classified elsewhere: Secondary | ICD-10-CM

## 2017-05-19 DIAGNOSIS — J069 Acute upper respiratory infection, unspecified: Secondary | ICD-10-CM

## 2017-05-19 NOTE — Telephone Encounter (Signed)
Pt reports non-productive "constant" cough since Thursday 05/13/17. Seen in ED Saturday, told she had bronchitis, given breathing treatment. Negative for flu. Temp over w/e 100.3 max.;down to 99.5 after tylenol. Has not rechecked. Reports SOB with coughing, chest and back sore from coughing.  States has taken multi OTC medications and followed all care advice from ED. "feels worse." Pt is [redacted] weeks pregnant. Has not established care with OB-GYN as of yet. Same Day appt made with D.Gessner.  Reason for Disposition . [1] Continuous (nonstop) coughing interferes with work or school AND [2] no improvement using cough treatment per protocol  Answer Assessment - Initial Assessment Questions 1. ONSET: "When did the cough begin?"      05/13/17 2. SEVERITY: "How bad is the cough today?"      severe 3. RESPIRATORY DISTRESS: "Describe your breathing."      SOB when coughing 4. FEVER: "Do you have a fever?" If so, ask: "What is your temperature, how was it measured, and when did it start?"     This weekend temp 100.3 max. Down to 99.5 after tylenol 5. HEMOPTYSIS: "Are you coughing up any blood?" If so ask: "How much?" (flecks, streaks, tablespoons, etc.)     No 6. TREATMENT: "What have you done so far to treat the cough?" (e.g., meds, fluids, humidifier)     Multi OTC medications 7. CARDIAC HISTORY: "Do you have any history of heart disease?" (e.g., heart attack, congestive heart failure)      no 8. LUNG HISTORY: "Do you have any history of lung disease?"  (e.g., pulmonary embolus, asthma, emphysema)     no 9. PE RISK FACTORS: "Do you have a history of blood clots?" (or: recent major surgery, recent prolonged travel, bedridden )     no 10. OTHER SYMPTOMS: "Do you have any other symptoms? (e.g., runny nose, wheezing, chest pain)       Chest and back sore from coughing. Can't get deep breath in. 11. PREGNANCY: "Is there any chance you are pregnant?" "When was your last menstrual period?"       Yes. Pt is [redacted]  weeks pregnant 12. TRAVEL: "Have you traveled out of the country in the last month?" (e.g., travel history, exposures)       no  Protocols used: COUGH - ACUTE NON-PRODUCTIVE-A-AH

## 2017-05-19 NOTE — Telephone Encounter (Signed)
Left message to call back  

## 2017-05-19 NOTE — Progress Notes (Signed)
Subjective:    Patient ID: Yvonne Trujillo, female    DOB: May 01, 1986, 32 y.o.   MRN: [id number]  HPI This is a 32 yo female who presents today with cough x 1 week. Was seen in ER yesterday and had negative CXR, breathing treatment with resolution of wheezing and decreased cough. Cough is dry. No SOB. Has been taking Robitussin, cough drops, Vicks, humidifier, acetaminophen, albuterol. Little wheeze. Uses albuterol every 6 hours, helps for about 1 hour. Asthma has been well controlled with as needed albuterol. Had fever several days ago. Some nasal drainage and congestion. Is [redacted] weeks pregnant. No nausea or vomiting. Has new ob appointment scheduled.  Cough worse at night. Feels a little better today, worried about cough.    Past Medical History:  Diagnosis Date  . Asthma   . Concussion   . History of depression   . Kidney infection   . Kidney stone   . Migraine    Past Surgical History:  Procedure Laterality Date  . APPENDECTOMY    . EXTRACORPOREAL SHOCK WAVE LITHOTRIPSY Left 07/09/2016   Procedure: EXTRACORPOREAL SHOCK WAVE LITHOTRIPSY (ESWL);  Surgeon: Vanna ScotlandAshley Brandon, MD;  Location: [facility] ORS;  Service: Urology;  Laterality: Left;   Family History  Problem Relation Age of Onset  . Alcohol abuse Maternal Uncle   . Hyperlipidemia Mother   . Hypertension Mother   . Heart disease Mother   . Hyperlipidemia Father   . Hypertension Father   . Diabetes Cousin   . Bladder Cancer Neg Hx   . Kidney cancer Neg Hx    Social History   Tobacco Use  . Smoking status: Former Smoker    Years: 6.00    Types: Cigarettes  . Smokeless tobacco: Never Used  Substance Use Topics  . Alcohol use: Yes    Alcohol/week: 0.0 oz    Comment: socially  . Drug use: No      Review of Systems Per HPI    Objective:   Physical Exam  Constitutional: She is oriented to person, place, and time. She appears well-developed and well-nourished. No distress.  HENT:  Head: Normocephalic and atraumatic.    Right Ear: Tympanic membrane, external ear and ear canal normal.  Left Ear: Tympanic membrane, external ear and ear canal normal.  Nose: Mucosal edema and rhinorrhea present.  Mouth/Throat: Oropharynx is clear and moist.  Eyes: Conjunctivae are normal.  Neck: Normal range of motion. Neck supple.  Cardiovascular: Normal rate, regular rhythm and normal heart sounds.  Pulmonary/Chest: Effort normal and breath sounds normal. No respiratory distress. She has no wheezes. She has no rales.  Neurological: She is alert and oriented to person, place, and time.  Skin: Skin is warm and dry. She is not diaphoretic.  Psychiatric: She has a normal mood and affect. Her behavior is normal. Judgment and thought content normal.  Vitals reviewed.     BP 116/80 (BP Location: Right Arm, Patient Position: Sitting, Cuff Size: Large)   Pulse (!) 104   Temp 97.9 F (36.6 C) (Oral)   Wt 219 lb (99.3 kg)   LMP 04/17/2017   SpO2 97%   BMI 41.38 kg/m      Assessment & Plan:  1. Viral URI with cough - Provided written and verbal information regarding diagnosis and treatment. - She has quite a bit of congestion and dry cough, discussed safe medications she can take while pregnant, expected course of illness, RTC precautions, provided reassurance  Olean Reeeborah Horst Ostermiller, FNP-BC  [facility], [facility]  05/20/2017 8:04 AM

## 2017-05-19 NOTE — Patient Instructions (Signed)
Continue albuterol, vicks, acetaminophen Can take Delsym for cough, Claritin for drainage, increase fluids If not better in 5 days please let us know, if worse, let us know sooner  First Trimester of Pregnancy The first trimester of pregnancy is from week 1 until the end of week 13 (months 1 through 3). During this time, your baby will begin to develop inside you. At 6-8 weeks, the eyes and face are formed, and the heartbeat can be seen on ultrasound. At the end of 12 weeks, all the baby's organs are formed. Prenatal care is all the medical care you receive before the birth of your baby. Make sure you get good prenatal care and follow all of your doctor's instructions. Follow these instructions at home: Medicines  Take over-the-counter and prescription medicines only as told by your doctor. Some medicines are safe and some medicines are not safe during pregnancy.  Take a prenatal vitamin that contains at least 600 micrograms (mcg) of folic acid.  If you have trouble pooping (constipation), take medicine that will make your stool soft (stool softener) if your doctor approves. Eating and drinking  Eat regular, healthy meals.  Your doctor will tell you the amount of weight gain that is right for you.  Avoid raw meat and uncooked cheese.  If you feel sick to your stomach (nauseous) or throw up (vomit): ? Eat 4 or 5 small meals a day instead of 3 large meals. ? Try eating a few soda crackers. ? Drink liquids between meals instead of during meals.  To prevent constipation: ? Eat foods that are high in fiber, like fresh fruits and vegetables, whole grains, and beans. ? Drink enough fluids to keep your pee (urine) clear or pale yellow. Activity  Exercise only as told by your doctor. Stop exercising if you have cramps or pain in your lower belly (abdomen) or low back.  Do not exercise if it is too hot, too humid, or if you are in a place of great height (high altitude).  Try to avoid  standing for long periods of time. Move your legs often if you must stand in one place for a long time.  Avoid heavy lifting.  Wear low-heeled shoes. Sit and stand up straight.  You can have sex unless your doctor tells you not to. Relieving pain and discomfort  Wear a good support bra if your breasts are sore.  Take warm water baths (sitz baths) to soothe pain or discomfort caused by hemorrhoids. Use hemorrhoid cream if your doctor says it is okay.  Rest with your legs raised if you have leg cramps or low back pain.  If you have puffy, bulging veins (varicose veins) in your legs: ? Wear support hose or compression stockings as told by your doctor. ? Raise (elevate) your feet for 15 minutes, 3-4 times a day. ? Limit salt in your food. Prenatal care  Schedule your prenatal visits by the twelfth week of pregnancy.  Write down your questions. Take them to your prenatal visits.  Keep all your prenatal visits as told by your doctor. This is important. Safety  Wear your seat belt at all times when driving.  Make a list of emergency phone numbers. The list should include numbers for family, friends, the hospital, and police and fire departments. General instructions  Ask your doctor for a referral to a local prenatal class. Begin classes no later than at the start of month 6 of your pregnancy.  Ask for help if you need  counseling or if you need help with nutrition. Your doctor can give you advice or tell you where to go for help.  Do not use hot tubs, steam rooms, or saunas.  Do not douche or use tampons or scented sanitary pads.  Do not cross your legs for long periods of time.  Avoid all herbs and alcohol. Avoid drugs that are not approved by your doctor.  Do not use any tobacco products, including cigarettes, chewing tobacco, and electronic cigarettes. If you need help quitting, ask your doctor. You may get counseling or other support to help you quit.  Avoid cat litter  boxes and soil used by cats. These carry germs that can cause birth defects in the baby and can cause a loss of your baby (miscarriage) or stillbirth.  Visit your dentist. At home, brush your teeth with a soft toothbrush. Be gentle when you floss. Contact a doctor if:  You are dizzy.  You have mild cramps or pressure in your lower belly.  You have a nagging pain in your belly area.  You continue to feel sick to your stomach, you throw up, or you have watery poop (diarrhea).  You have a bad smelling fluid coming from your vagina.  You have pain when you pee (urinate).  You have increased puffiness (swelling) in your face, hands, legs, or ankles. Get help right away if:  You have a fever.  You are leaking fluid from your vagina.  You have spotting or bleeding from your vagina.  You have very bad belly cramping or pain.  You gain or lose weight rapidly.  You throw up blood. It may look like coffee grounds.  You are around people who have [place] measles, fifth disease, or chickenpox.  You have a very bad headache.  You have shortness of breath.  You have any kind of trauma, such as from a fall or a car accident. Summary  The first trimester of pregnancy is from week 1 until the end of week 13 (months 1 through 3).  To take care of yourself and your unborn baby, you will need to eat healthy meals, take medicines only if your doctor tells you to do so, and do activities that are safe for you and your baby.  Keep all follow-up visits as told by your doctor. This is important as your doctor will have to ensure that your baby is healthy and growing well. This information is not intended to replace advice given to you by your health care provider. Make sure you discuss any questions you have with your health care provider. Document Released: 10/07/2007 Document Revised: 04/28/2016 Document Reviewed: 04/28/2016 Elsevier Interactive Patient Education  2017 Elsevier  Inc.    Upper Respiratory Infection, Adult Most upper respiratory infections (URIs) are caused by a virus. A URI affects the nose, throat, and upper air passages. The most common type of URI is often called "the common cold." Follow these instructions at home:  Take medicines only as told by your doctor.  Gargle warm saltwater or take cough drops to comfort your throat as told by your doctor.  Use a warm mist humidifier or inhale steam from a shower to increase air moisture. This may make it easier to breathe.  Drink enough fluid to keep your pee (urine) clear or pale yellow.  Eat soups and other clear broths.  Have a healthy diet.  Rest as needed.  Go back to work when your fever is gone or your doctor says it  is okay. ? You may need to stay home longer to avoid giving your URI to others. ? You can also wear a face mask and wash your hands often to prevent spread of the virus.  Use your inhaler more if you have asthma.  Do not use any tobacco products, including cigarettes, chewing tobacco, or electronic cigarettes. If you need help quitting, ask your doctor. Contact a doctor if:  You are getting worse, not better.  Your symptoms are not helped by medicine.  You have chills.  You are getting more short of breath.  You have brown or red mucus.  You have yellow or brown discharge from your nose.  You have pain in your face, especially when you bend forward.  You have a fever.  You have puffy (swollen) neck glands.  You have pain while swallowing.  You have white areas in the back of your throat. Get help right away if:  You have very bad or constant: ? Headache. ? Ear pain. ? Pain in your forehead, behind your eyes, and over your cheekbones (sinus pain). ? Chest pain.  You have long-lasting (chronic) lung disease and any of the following: ? Wheezing. ? Long-lasting cough. ? Coughing up blood. ? A change in your usual mucus.  You have a stiff  neck.  You have changes in your: ? Vision. ? Hearing. ? Thinking. ? Mood. This information is not intended to replace advice given to you by your health care provider. Make sure you discuss any questions you have with your health care provider. Document Released: 10/07/2007 Document Revised: 12/22/2015 Document Reviewed: 07/26/2013 Elsevier Interactive Patient Education  2018 [employer].

## 2017-05-20 ENCOUNTER — Ambulatory Visit: Payer: Self-pay

## 2017-05-20 ENCOUNTER — Encounter: Payer: Self-pay | Admitting: Family Medicine

## 2017-05-20 MED ORDER — AZITHROMYCIN 1 % OP SOLN: OPHTHALMIC | 0 refills | Status: DC

## 2017-05-20 NOTE — Telephone Encounter (Signed)
Patient called in with c/o "left eye yellow drainage." She says she was seen in the office on yesterday and was told she had a viral bronchitis. Last night she noticed her left eye was draining yellow drainage and woke up in the middle of the night with her eye matted shut. She was able to clean it and open the eye. She says "the left eye is a little red and the eyelid a little swollen." She denies pain to the eye. According to protocol, call PCP within 24 hours, I informed her the PCP will see the note and she will be notified per protocol within 24 hours of a decision, care advice given, she verbalized understanding.   Reason for Disposition . [1] Eye with yellow/green discharge or eyelashes stick together AND [2] NO PCP standing order to call in antibiotic eye drops  Answer Assessment - Initial Assessment Questions 1. EYE DISCHARGE: "Is the discharge in one or both eyes?" "What color is it?" "How much is there?" "When did the discharge start?"      Yellowish drainage left eye, started yesterday 2. REDNESS OF SCLERA: "Is the redness in one or both eyes?" "When did the redness start?"      Left eye is red a little bit 3. EYELIDS: "Are the eyelids red or swollen?" If so, ask: "How much?"      Left eyelid a little swollen 4. VISION: "Is there any difficulty seeing clearly?"      Yes 5. PAIN: "Is there any pain? If so, ask: "How bad is it?" (Scale 1-10; or mild, moderate, severe)    - MILD (1-3): doesn't interfere with normal activities     - MODERATE (4-7): interferes with normal activities or awakens from sleep    - SEVERE (8-10): excruciating pain, unable to do any normal activities       Denies 6. CONTACT LENS: "Do you wear contacts?"     Yes, but I'm wearing glasses today 7. OTHER SYMPTOMS: "Do you have any other symptoms?" (e.g., fever, runny nose, cough)     Cough, runny and stuffy nose 8. PREGNANCY: "Is there any chance you are pregnant?" "When was your last menstrual period?"  Yes-5 weeks  Protocols used: EYE - PUS OR DISCHARGE-A-AH

## 2017-05-20 NOTE — Telephone Encounter (Signed)
Would be reasonable to use azithromycin eyedrops.  That is likely the best option with pregnancy and assuming she has a bacterial conjunctivitis, esp since she only has one eye affected.  If not better, then needs recheck.  rx sent.  Thanks.

## 2017-05-20 NOTE — Telephone Encounter (Signed)
Patient advised.

## 2017-05-20 NOTE — Telephone Encounter (Signed)
Harlin Heys Gessner FNP is not in office.Please advise.

## 2017-05-20 NOTE — Telephone Encounter (Signed)
Appt made and patient aware.  

## 2017-05-26 ENCOUNTER — Telehealth: Payer: Self-pay | Admitting: Family Medicine

## 2017-05-26 ENCOUNTER — Other Ambulatory Visit: Payer: Self-pay | Admitting: Family Medicine

## 2017-05-26 MED ORDER — AMOXICILLIN 875 MG PO TABS: 875.0000 mg | ORAL_TABLET | Freq: Two times a day (BID) | ORAL | 0 refills | Status: DC

## 2017-05-26 NOTE — Telephone Encounter (Signed)
Spoke to patient and was advised that she saw Yvonne Sprangebbie Gessner NP last week and was diagnosed with viral bronchitis. Patient stated that she has had this for 13 days and is getting worse. Patient stated that she is pregnant. Patient stated that she has a really bad cough, productive at times-yellow, stuffy nose and runny at times and no fever.Patient stated that her upper back and stomach hurts, no diarrhea or nausea.  Patient stated that she doesn't know what else to do and wants advice from NP. Pharmacy-[facility]

## 2017-05-26 NOTE — Telephone Encounter (Signed)
Called patient and she reports continued cough, little nasal drainage. Nasal congestion and post nasal drainage improved. Some SOB with exertion, no wheeze. Coughing same night and day. Cough mostly dry, little sputum. Currently taking Robitussin and Vics vapo rub, tylenol. Sore in back and upper abdomen from cough. No fever. Not sleeping, feeling fatigued.

## 2017-05-26 NOTE — Telephone Encounter (Signed)
Continuation of previous note Will send in amoxicillin, discussed use of Delsym, Mucinex. If worsening or no improvement, RTC for recheck.

## 2017-05-26 NOTE — Telephone Encounter (Signed)
Copied from CRM [id number]. Topic: Quick Communication - See Telephone Encounter >> May 26, 2017 11:04 AM Jolayne Hainesaylor, Brittany L wrote: CRM for notification. See Telephone encounter for:   05/26/17.  Patient said she isnt feeling better with the bronchitis. She said she is having upper stomach pains now. She would like a nurse to call her back.

## 2017-06-23 ENCOUNTER — Ambulatory Visit (INDEPENDENT_AMBULATORY_CARE_PROVIDER_SITE_OTHER): Payer: Medicaid Other | Admitting: Family Medicine

## 2017-06-23 ENCOUNTER — Encounter: Payer: Self-pay | Admitting: Family Medicine

## 2017-06-23 ENCOUNTER — Other Ambulatory Visit (HOSPITAL_COMMUNITY)
Admission: RE | Admit: 2017-06-23 | Discharge: 2017-06-23 | Disposition: A | Discharge status: Home / Self Care | Payer: Medicaid Other | Source: Ambulatory Visit | Attending: Family Medicine | Admitting: Family Medicine

## 2017-06-23 VITALS — BP 112/70 | HR 72 | Wt 217.0 lb

## 2017-06-23 DIAGNOSIS — O99211 Obesity complicating pregnancy, first trimester: Secondary | ICD-10-CM

## 2017-06-23 DIAGNOSIS — Z3400 Encounter for supervision of normal first pregnancy, unspecified trimester: Secondary | ICD-10-CM

## 2017-06-23 DIAGNOSIS — Z3403 Encounter for supervision of normal first pregnancy, third trimester: Secondary | ICD-10-CM | POA: Diagnosis not present

## 2017-06-23 DIAGNOSIS — Z3401 Encounter for supervision of normal first pregnancy, first trimester: Secondary | ICD-10-CM

## 2017-06-23 DIAGNOSIS — G43009 Migraine without aura, not intractable, without status migrainosus: Secondary | ICD-10-CM

## 2017-06-23 DIAGNOSIS — O99210 Obesity complicating pregnancy, unspecified trimester: Secondary | ICD-10-CM | POA: Insufficient documentation

## 2017-06-23 DIAGNOSIS — Z3687 Encounter for antenatal screening for uncertain dates: Secondary | ICD-10-CM

## 2017-06-23 DIAGNOSIS — Z6841 Body Mass Index (BMI) 40.0 and over, adult: Secondary | ICD-10-CM

## 2017-06-23 HISTORY — DX: Encounter for supervision of normal first pregnancy, unspecified trimester: Z34.00

## 2017-06-23 MED ORDER — CYCLOBENZAPRINE HCL 10 MG PO TABS: 10.0000 mg | ORAL_TABLET | Freq: Three times a day (TID) | ORAL | 1 refills | Status: DC | PRN

## 2017-06-23 MED ORDER — PRENATAL VITAMINS 0.8 MG PO TABS: 1.0000 | ORAL_TABLET | Freq: Every day | ORAL | 12 refills | Status: DC

## 2017-06-23 MED ORDER — ASPIRIN EC 81 MG PO TBEC: 81.0000 mg | DELAYED_RELEASE_TABLET | Freq: Every day | ORAL | 2 refills | Status: DC

## 2017-06-23 MED ORDER — PROMETHAZINE HCL 25 MG PO TABS: 12.5000 mg | ORAL_TABLET | Freq: Four times a day (QID) | ORAL | 2 refills | Status: DC | PRN

## 2017-06-23 NOTE — Progress Notes (Signed)
   PRENATAL VISIT NOTE  Subjective:  Marlou Saoni R Irani is a 32 y.o. G1P0 at 7876w4d being seen today for ongoing prenatal care.  She is currently monitored for the following issues for this high-risk pregnancy and has Migraine; Depression with anxiety; Hirsutism; Acne; History of kidney stones; Hyperlipidemia; Herpes simplex; Obesity affecting pregnancy, antepartum; Supervision of normal first pregnancy, antepartum; and Morbid obesity with BMI of 40.0-44.9, adult (HCC) on their problem list.  Patient reports nausea.   .  .  Movement: Absent. Denies leaking of fluid.   The following portions of the patient's history were reviewed and updated as appropriate: allergies, current medications, past family history, past medical history, past social history, past surgical history and problem list. Problem list updated.  Objective:   Vitals:   06/23/17 0913  BP: 112/70  Pulse: 72  Weight: 217 lb (98.4 kg)    Fetal Status: Fetal Heart Rate (bpm): 156   Movement: Absent     General:  Alert, oriented and cooperative. Patient is in no acute distress.  Skin: Skin is warm and dry. No rash noted.   Cardiovascular: Normal heart rate noted  Respiratory: Normal respiratory effort, no problems with respiration noted  Abdomen: Soft, gravid, appropriate for gestational age.  Pain/Pressure: Absent     Pelvic: Cervical exam deferred        Extremities: Normal range of motion.     Mental Status:  Normal mood and affect. Normal behavior. Normal judgment and thought content.   Assessment and Plan:  Pregnancy: G1P0 at 3276w4d  1. Encounter for supervision of normal first pregnancy in third trimester -Reviewed group practice in depth including multidisciplinary team (OB, FM, CNM, FM residents, medical and PA students) - Discussed genetic screening options in depth-- desires NIPT and NT testing - Obstetric Panel, Including HIV - Urine Culture - Cytology - PAP - Hemoglobinopathy evaluation - Cystic Fibrosis Mutation  97 - US MFM Fetal Nuchal Translucency; Future  2. Obesity affecting pregnancy, antepartum Recommended 11-15 lbs weight gain Avoid dieting/weight loss in pregnancy Increase exercise Recommended ASA starting at 12 weeks  3. Supervision of normal first pregnancy, antepartum   Preterm labor symptoms and general obstetric precautions including but not limited to vaginal bleeding, contractions, leaking of fluid and fetal movement were reviewed in detail with the patient. Please refer to After Visit Summary for other counseling recommendations.  Return in about 4 weeks (around 07/21/2017) for Routine prenatal care.   Federico FlakeKimberly Niles Newton, MD

## 2017-06-23 NOTE — Progress Notes (Signed)
DATING AND VIABILITY SONOGRAM   Yvonne Trujillo is a 32 y.o. year old G1P0 with LMP Patient's last menstrual period was 04/17/2017. which would correlate to  924w4d weeks gestation.  She has regular menstrual cycles.   She is here today for a confirmatory initial sonogram.    GESTATION:  SINGLETON 1  FETAL ACTIVITY:       Heart rate  156                  The fetus is active.   GESTATIONAL AGE AND  BIOMETRICS:  Gestational criteria: Estimated Date of Delivery: 01/22/18 by LMP now at 4324w4d  Previous Scans:0  GESTATIONAL SAC     2.78 mm          CROWN RUMP LENGTH      2.78 mm                                           AVERAGE EGA(BY THIS SCAN):  9 weeks  4days  WORKING EDD( LMP ):  04/17/2017     TECHNICIAN COMMENTS:  SLIUP measuring 9 weeks 4 days by CRL/FL/HC with HR of 156   A copy of this report including all images has been saved and backed up to a second source for retrieval if needed. All measures and details of the anatomical scan, placentation, fluid volume and pelvic anatomy are contained in that report.  Demetrice Cheree DittoGraham 06/23/2017 10:16 AM    Attestation of obeservation: I was present and reviewed images of CMA. I helped with appropriate CRL measurement.   Federico FlakeKimberly Niles Newton, MD, MPH, ABFM Attending Physician Faculty Practice- [facility]

## 2017-06-24 LAB — URINE CULTURE

## 2017-06-24 LAB — OBSTETRIC PANEL, INCLUDING HIV
ANTIBODY SCREEN: NEGATIVE
BASOS: 0 %
Basophils Absolute: 0 10*3/uL (ref 0.0–0.2)
EOS (ABSOLUTE): 0.1 10*3/uL (ref 0.0–0.4)
EOS: 1 %
HEMATOCRIT: 35.8 % (ref 34.0–46.6)
HEMOGLOBIN: 12.1 g/dL (ref 11.1–15.9)
HIV Screen 4th Generation wRfx: NONREACTIVE
Hepatitis B Surface Ag: NEGATIVE
IMMATURE GRANS (ABS): 0 10*3/uL (ref 0.0–0.1)
Immature Granulocytes: 0 %
LYMPHS ABS: 2.9 10*3/uL (ref 0.7–3.1)
LYMPHS: 27 %
MCH: 28.7 pg (ref 26.6–33.0)
MCHC: 33.8 g/dL (ref 31.5–35.7)
MCV: 85 fL (ref 79–97)
MONOS ABS: 0.4 10*3/uL (ref 0.1–0.9)
Monocytes: 4 %
Neutrophils Absolute: 7.2 10*3/uL — ABNORMAL HIGH (ref 1.4–7.0)
Neutrophils: 68 %
Platelets: 406 10*3/uL — ABNORMAL HIGH (ref 150–379)
RBC: 4.22 x10E6/uL (ref 3.77–5.28)
RDW: 14 % (ref 12.3–15.4)
RH TYPE: POSITIVE
RPR Ser Ql: NONREACTIVE
Rubella Antibodies, IGG: 1.11 index (ref >0.99)
WBC: 10.7 10*3/uL (ref 3.4–10.8)

## 2017-06-28 LAB — CYTOLOGY - PAP: Diagnosis: NEGATIVE

## 2017-06-30 LAB — CYSTIC FIBROSIS MUTATION 97: Interpretation: NOT DETECTED

## 2017-06-30 LAB — HEMOGLOBINOPATHY EVALUATION
HEMOGLOBIN F QUANTITATION: 0.5 % (ref 0.0–2.0)
HGB A: 97.3 % (ref 96.4–98.8)
HGB C: 0 %
HGB S: 0 %
HGB VARIANT: 0 %
Hemoglobin A2 Quantitation: 2.2 % (ref 1.8–3.2)

## 2017-07-07 ENCOUNTER — Encounter (HOSPITAL_COMMUNITY): Payer: Self-pay | Admitting: Family Medicine

## 2017-07-15 ENCOUNTER — Other Ambulatory Visit: Payer: Self-pay | Admitting: Family Medicine

## 2017-07-15 ENCOUNTER — Ambulatory Visit (HOSPITAL_COMMUNITY)
Admission: RE | Admit: 2017-07-15 | Discharge: 2017-07-15 | Disposition: A | Discharge status: Home / Self Care | Payer: Medicaid Other | Source: Ambulatory Visit | Attending: Family Medicine | Admitting: Family Medicine

## 2017-07-15 ENCOUNTER — Encounter (HOSPITAL_COMMUNITY): Payer: Self-pay

## 2017-07-15 DIAGNOSIS — Z3682 Encounter for antenatal screening for nuchal translucency: Secondary | ICD-10-CM | POA: Diagnosis not present

## 2017-07-15 DIAGNOSIS — Z3A12 12 weeks gestation of pregnancy: Secondary | ICD-10-CM

## 2017-07-15 DIAGNOSIS — Z3A18 18 weeks gestation of pregnancy: Secondary | ICD-10-CM | POA: Diagnosis not present

## 2017-07-15 DIAGNOSIS — Z3403 Encounter for supervision of normal first pregnancy, third trimester: Secondary | ICD-10-CM

## 2017-07-21 ENCOUNTER — Ambulatory Visit (INDEPENDENT_AMBULATORY_CARE_PROVIDER_SITE_OTHER): Payer: Medicaid Other | Admitting: Family Medicine

## 2017-07-21 VITALS — BP 116/72 | HR 72 | Wt 221.0 lb

## 2017-07-21 DIAGNOSIS — O99210 Obesity complicating pregnancy, unspecified trimester: Secondary | ICD-10-CM

## 2017-07-21 DIAGNOSIS — Z3401 Encounter for supervision of normal first pregnancy, first trimester: Secondary | ICD-10-CM

## 2017-07-21 DIAGNOSIS — O99211 Obesity complicating pregnancy, first trimester: Secondary | ICD-10-CM

## 2017-07-21 DIAGNOSIS — Z3400 Encounter for supervision of normal first pregnancy, unspecified trimester: Secondary | ICD-10-CM

## 2017-07-21 DIAGNOSIS — Z6841 Body Mass Index (BMI) 40.0 and over, adult: Secondary | ICD-10-CM

## 2017-07-21 LAB — HEMOGLOBIN A1C
Est. average glucose Bld gHb Est-mCnc: 103 mg/dL
HEMOGLOBIN A1C: 5.2 % (ref 4.8–5.6)

## 2017-07-21 NOTE — Progress Notes (Signed)
   PRENATAL VISIT NOTE  Subjective:  Yvonne Trujillo is a 32 y.o. G1P0 at 398w4d being seen today for ongoing prenatal care.  She is currently monitored for the following issues for this high-risk pregnancy and has Migraine; Depression with anxiety; Hirsutism; Acne; History of kidney stones; Hyperlipidemia; Herpes simplex; Obesity affecting pregnancy, antepartum; Supervision of normal first pregnancy, antepartum; and Morbid obesity with BMI of 40.0-44.9, adult (HCC) on their problem list.  Patient reports no complaints.  Contractions: Not present. Vag. Bleeding: None.  Movement: Absent. Denies leaking of fluid.   The following portions of the patient's history were reviewed and updated as appropriate: allergies, current medications, past family history, past medical history, past social history, past surgical history and problem list. Problem list updated.  Objective:   Vitals:   07/21/17 1605  BP: 116/72  Pulse: 72  Weight: 221 lb (100.2 kg)    Fetal Status: Fetal Heart Rate (bpm): 148   Movement: Absent     General:  Alert, oriented and cooperative. Patient is in no acute distress.  Skin: Skin is warm and dry. No rash noted.   Cardiovascular: Normal heart rate noted  Respiratory: Normal respiratory effort, no problems with respiration noted  Abdomen: Soft, gravid, appropriate for gestational age.  Pain/Pressure: Absent     Pelvic: Cervical exam deferred        Extremities: Normal range of motion.     Mental Status:  Normal mood and affect. Normal behavior. Normal judgment and thought content.   Assessment and Plan:  Pregnancy: G1P0 at 708w4d  1. Supervision of normal first pregnancy, antepartum - feeling well, had one episode of vomiting - Genetic Screening- NIPT today - NT wnl  2. Morbid obesity with BMI of 40.0-44.9, adult (HCC) - Hemoglobin A1c - start ASA today  Preterm labor symptoms and general obstetric precautions including but not limited to vaginal bleeding,  contractions, leaking of fluid and fetal movement were reviewed in detail with the patient. Please refer to After Visit Summary for other counseling recommendations.   Return in about 4 weeks (around 08/18/2017) for Routine prenatal care.   Federico FlakeKimberly Niles Eyan Hagood, MD

## 2017-07-27 ENCOUNTER — Encounter: Payer: Self-pay | Admitting: *Deleted

## 2017-07-27 ENCOUNTER — Encounter: Payer: Self-pay | Admitting: Radiology

## 2017-07-29 ENCOUNTER — Other Ambulatory Visit: Payer: Self-pay

## 2017-07-29 ENCOUNTER — Telehealth: Payer: Self-pay

## 2017-07-29 NOTE — Telephone Encounter (Signed)
Spoke to Nelsononi regarding her the pressure she was having. She thinks it may be the baby moving around since this is her first one. I have advised her if the pain get worse she can take Tylenol to see if she get any relief.

## 2017-07-29 NOTE — Telephone Encounter (Signed)
-----   Message from Lindell SparHeather L Bacon, [place] sent at 07/29/2017  4:20 PM EDT ----- Regarding: pressure on Right side  Contact: [phone] Please call Sheralyn Boatmanoni, she complains of pressure on the Right side of her abdomin, states she has no bleeding. 14 wk  preg

## 2017-08-18 ENCOUNTER — Ambulatory Visit (INDEPENDENT_AMBULATORY_CARE_PROVIDER_SITE_OTHER): Payer: Medicaid Other | Admitting: Family Medicine

## 2017-08-18 VITALS — BP 110/76 | HR 72 | Wt 221.0 lb

## 2017-08-18 DIAGNOSIS — Z3400 Encounter for supervision of normal first pregnancy, unspecified trimester: Secondary | ICD-10-CM

## 2017-08-18 DIAGNOSIS — Z6841 Body Mass Index (BMI) 40.0 and over, adult: Secondary | ICD-10-CM

## 2017-08-18 DIAGNOSIS — O99212 Obesity complicating pregnancy, second trimester: Secondary | ICD-10-CM

## 2017-08-18 DIAGNOSIS — O99210 Obesity complicating pregnancy, unspecified trimester: Secondary | ICD-10-CM

## 2017-08-18 DIAGNOSIS — Z3402 Encounter for supervision of normal first pregnancy, second trimester: Secondary | ICD-10-CM

## 2017-08-18 NOTE — Patient Instructions (Signed)

## 2017-08-18 NOTE — Progress Notes (Signed)
   PRENATAL VISIT NOTE  Subjective:  Yvonne Trujillo is a 32 y.o. G1P0 at 8953w4d being seen today for ongoing prenatal care.  She is currently monitored for the following issues for this high-risk pregnancy and has Migraine; Depression with anxiety; Hirsutism; Acne; History of kidney stones; Hyperlipidemia; Herpes simplex; Obesity affecting pregnancy, antepartum; Supervision of normal first pregnancy, antepartum; and Morbid obesity with BMI of 40.0-44.9, adult (HCC) on their problem list.  Patient reports no complaints.  Contractions: Not present.  .  Movement: Present. Denies leaking of fluid.   The following portions of the patient's history were reviewed and updated as appropriate: allergies, current medications, past family history, past medical history, past social history, past surgical history and problem list. Problem list updated.  Objective:   Vitals:   08/18/17 1629  BP: 110/76  Pulse: 72  Weight: 221 lb (100.2 kg)    Fetal Status: Fetal Heart Rate (bpm): 147   Movement: Present     General:  Alert, oriented and cooperative. Patient is in no acute distress.  Skin: Skin is warm and dry. No rash noted.   Cardiovascular: Normal heart rate noted  Respiratory: Normal respiratory effort, no problems with respiration noted  Abdomen: Soft, gravid, appropriate for gestational age.  Pain/Pressure: Absent     Pelvic: Cervical exam deferred        Extremities: Normal range of motion.     Mental Status: Normal mood and affect. Normal behavior. Normal judgment and thought content.   Assessment and Plan:  Pregnancy: G1P0 at 1753w4d  1. Obesity affecting pregnancy, antepartum TWG = 1 lb (0.454 kg)  2. Supervision of normal first pregnancy, antepartum UTD Genetics screenings low risk msAFP today  3. Morbid obesity with BMI of 40.0-44.9, adult (HCC) Gaining weight appropriately  Preterm labor symptoms and general obstetric precautions including but not limited to vaginal bleeding,  contractions, leaking of fluid and fetal movement were reviewed in detail with the patient. Please refer to After Visit Summary for other counseling recommendations.  Return in about 1 month (around 09/15/2017) for Routine prenatal care.  Future Appointments  Date Time Provider Department Center  08/31/2017  9:30 AM [facility] US 5 [facility]  09/15/2017  4:00 PM Federico FlakeNewton, Tasmin Exantus Niles, MD [facility]    Federico FlakeKimberly Niles Rayann Jolley, MD

## 2017-08-22 LAB — AFP TETRA
DIA MOM VALUE: 1.59
DIA Value (EIA): 213.55 pg/mL
DSR (BY AGE) 1 IN: 682
DSR (SECOND TRIMESTER) 1 IN: 10000
Gestational Age: 17 WEEKS
MATERNAL AGE AT EDD: 30.2 a
MSAFP Mom: 2.48
MSAFP: 72.1 ng/mL
MSHCG Mom: 2.04
MSHCG: 50287 m[IU]/mL
OSB RISK: 299
T18 (By Age): 1:2657 {titer}
TEST RESULTS AFP: NEGATIVE
Weight: 221 [lb_av]
uE3 Mom: 1.95
uE3 Value: 1.84 ng/mL

## 2017-08-31 ENCOUNTER — Ambulatory Visit (HOSPITAL_COMMUNITY)
Admission: RE | Admit: 2017-08-31 | Discharge: 2017-08-31 | Disposition: A | Discharge status: Home / Self Care | Payer: Medicaid Other | Source: Ambulatory Visit | Attending: Family Medicine | Admitting: Family Medicine

## 2017-08-31 ENCOUNTER — Other Ambulatory Visit (HOSPITAL_COMMUNITY): Payer: Self-pay | Admitting: *Deleted

## 2017-08-31 ENCOUNTER — Encounter (HOSPITAL_COMMUNITY): Payer: Self-pay

## 2017-08-31 ENCOUNTER — Ambulatory Visit (INDEPENDENT_AMBULATORY_CARE_PROVIDER_SITE_OTHER): Payer: Medicaid Other

## 2017-08-31 ENCOUNTER — Telehealth: Payer: Self-pay

## 2017-08-31 ENCOUNTER — Other Ambulatory Visit: Payer: Self-pay | Admitting: Family Medicine

## 2017-08-31 VITALS — BP 110/76 | HR 76

## 2017-08-31 DIAGNOSIS — Z369 Encounter for antenatal screening, unspecified: Secondary | ICD-10-CM

## 2017-08-31 DIAGNOSIS — Z6841 Body Mass Index (BMI) 40.0 and over, adult: Secondary | ICD-10-CM

## 2017-08-31 DIAGNOSIS — O99212 Obesity complicating pregnancy, second trimester: Secondary | ICD-10-CM

## 2017-08-31 DIAGNOSIS — Z3A19 19 weeks gestation of pregnancy: Secondary | ICD-10-CM

## 2017-08-31 DIAGNOSIS — Z0374 Encounter for suspected problem with fetal growth ruled out: Secondary | ICD-10-CM | POA: Diagnosis not present

## 2017-08-31 DIAGNOSIS — Z3400 Encounter for supervision of normal first pregnancy, unspecified trimester: Secondary | ICD-10-CM

## 2017-08-31 DIAGNOSIS — Z0130 Encounter for examination of blood pressure without abnormal findings: Secondary | ICD-10-CM

## 2017-08-31 DIAGNOSIS — Z362 Encounter for other antenatal screening follow-up: Secondary | ICD-10-CM

## 2017-08-31 NOTE — Progress Notes (Signed)
Subjective:  Yvonne Trujillo is a 32 y.o. female here for BP check.   Hypertension ROS: Patient reports having a headache all day yesterday and some swelling in her feet and requested to come in for a nurse visit for blood pressure check.     Objective:  LMP 04/17/2017   Appearance alert, well appearing, and in no distress  General exam BP noted to be well controlled today in office.    Assessment:   Blood Pressure: normal at this time and reports not headache at this time.  Plan: She has been advised to monitor pressure at home and to call us if she has any dizziness, headache that will not go away with medication or visual disturbance.

## 2017-08-31 NOTE — Progress Notes (Signed)
I have reviewed the chart and agree with nursing staff's documentation of this patient's encounter.  Jaynie Collins, MD 08/31/2017 1:26 PM

## 2017-08-31 NOTE — Telephone Encounter (Signed)
Patient called and stated her feet have been swelling more and wanted to know if maybe she can come have her blood pressure check to make sure it is ok. Patient has been scheduled for appointment.

## 2017-09-15 ENCOUNTER — Ambulatory Visit (INDEPENDENT_AMBULATORY_CARE_PROVIDER_SITE_OTHER): Payer: Medicaid Other | Admitting: Family Medicine

## 2017-09-15 ENCOUNTER — Encounter: Payer: Self-pay | Admitting: Family Medicine

## 2017-09-15 VITALS — BP 114/79 | HR 72 | Wt 222.8 lb

## 2017-09-15 DIAGNOSIS — O99212 Obesity complicating pregnancy, second trimester: Secondary | ICD-10-CM

## 2017-09-15 DIAGNOSIS — O99210 Obesity complicating pregnancy, unspecified trimester: Secondary | ICD-10-CM

## 2017-09-15 DIAGNOSIS — Z3402 Encounter for supervision of normal first pregnancy, second trimester: Secondary | ICD-10-CM

## 2017-09-15 DIAGNOSIS — Z6841 Body Mass Index (BMI) 40.0 and over, adult: Secondary | ICD-10-CM

## 2017-09-15 DIAGNOSIS — Z3400 Encounter for supervision of normal first pregnancy, unspecified trimester: Secondary | ICD-10-CM

## 2017-09-15 NOTE — Progress Notes (Signed)
   PRENATAL VISIT NOTE  Subjective:  Yvonne Trujillo is a 32 y.o. G1P0 at [redacted]w[redacted]d being seen today for ongoing prenatal care.  She is currently monitored for the following issues for this high-risk pregnancy and has Migraine; Depression with anxiety; Hirsutism; Acne; History of kidney stones; Hyperlipidemia; Herpes simplex; Obesity affecting pregnancy, antepartum; Supervision of normal first pregnancy, antepartum; and Morbid obesity with BMI of 40.0-44.9, adult (HCC) on their problem list.  Patient reports no complaints.  Contractions: Not present.  .  Movement: Present. Denies leaking of fluid.   The following portions of the patient's history were reviewed and updated as appropriate: allergies, current medications, past family history, past medical history, past social history, past surgical history and problem list. Problem list updated.  Objective:   Vitals:   09/15/17 1612  BP: 114/79  Pulse: 72  Weight: 222 lb 12.8 oz (101.1 kg)    Fetal Status: Fetal Heart Rate (bpm): 135   Movement: Present     General:  Alert, oriented and cooperative. Patient is in no acute distress.  Skin: Skin is warm and dry. No rash noted.   Cardiovascular: Normal heart rate noted  Respiratory: Normal respiratory effort, no problems with respiration noted  Abdomen: Soft, gravid, appropriate for gestational age.  Pain/Pressure: Absent     Pelvic: Cervical exam deferred        Extremities: Normal range of motion.     Mental Status: Normal mood and affect. Normal behavior. Normal judgment and thought content.   Assessment and Plan:  Pregnancy: G1P0 at [redacted]w[redacted]d  1. Supervision of normal first pregnancy, antepartum UTD Needs repeat US for heart view, scheduled on 5/28  2. Obesity affecting pregnancy, antepartum TWG= 2 lb 12.8 oz (1.27 kg) -- appropriate and on-target. Applauded patient on appropriate weight gain Discussed healthy weight gain in pregnancy and safety of exercise.   3. Morbid obesity with BMI of  40.0-44.9, adult (HCC) Taking ASA   Preterm labor symptoms and general obstetric precautions including but not limited to vaginal bleeding, contractions, leaking of fluid and fetal movement were reviewed in detail with the patient. Please refer to After Visit Summary for other counseling recommendations.  No follow-ups on file.  Future Appointments  Date Time Provider Department Center  09/28/2017 10:30 AM [facility] [place] 5 [facility]    Federico Flake, MD

## 2017-09-15 NOTE — Patient Instructions (Signed)

## 2017-09-28 ENCOUNTER — Other Ambulatory Visit (HOSPITAL_COMMUNITY): Payer: Self-pay | Admitting: Obstetrics and Gynecology

## 2017-09-28 ENCOUNTER — Ambulatory Visit (HOSPITAL_COMMUNITY)
Admission: RE | Admit: 2017-09-28 | Discharge: 2017-09-28 | Disposition: A | Discharge status: Home / Self Care | Payer: Medicaid Other | Source: Ambulatory Visit | Attending: Family Medicine | Admitting: Family Medicine

## 2017-09-28 ENCOUNTER — Encounter (HOSPITAL_COMMUNITY): Payer: Self-pay

## 2017-09-28 DIAGNOSIS — Z3A23 23 weeks gestation of pregnancy: Secondary | ICD-10-CM

## 2017-09-28 DIAGNOSIS — Z362 Encounter for other antenatal screening follow-up: Secondary | ICD-10-CM | POA: Diagnosis not present

## 2017-09-28 DIAGNOSIS — Z3400 Encounter for supervision of normal first pregnancy, unspecified trimester: Secondary | ICD-10-CM

## 2017-09-28 DIAGNOSIS — O99212 Obesity complicating pregnancy, second trimester: Secondary | ICD-10-CM

## 2017-09-28 DIAGNOSIS — O99210 Obesity complicating pregnancy, unspecified trimester: Secondary | ICD-10-CM

## 2017-10-01 ENCOUNTER — Ambulatory Visit (INDEPENDENT_AMBULATORY_CARE_PROVIDER_SITE_OTHER): Payer: Medicaid Other | Admitting: Obstetrics & Gynecology

## 2017-10-01 VITALS — BP 113/86 | HR 71 | Wt 225.0 lb

## 2017-10-01 DIAGNOSIS — O99210 Obesity complicating pregnancy, unspecified trimester: Secondary | ICD-10-CM

## 2017-10-01 DIAGNOSIS — R05 Cough: Secondary | ICD-10-CM

## 2017-10-01 DIAGNOSIS — Z3400 Encounter for supervision of normal first pregnancy, unspecified trimester: Secondary | ICD-10-CM

## 2017-10-01 DIAGNOSIS — R059 Cough, unspecified: Secondary | ICD-10-CM

## 2017-10-01 NOTE — Progress Notes (Signed)
   PRENATAL VISIT NOTE  Subjective:  Yvonne Trujillo is a 32 y.o. G1P0 at [redacted]w[redacted]d being seen today for ongoing prenatal care.  She is currently monitored for the following issues for this low-risk pregnancy and has Migraine; Depression with anxiety; Hirsutism; Acne; History of kidney stones; Hyperlipidemia; Herpes simplex; Obesity affecting pregnancy, antepartum; Supervision of normal first pregnancy, antepartum; and Morbid obesity with BMI of 40.0-44.9, adult (HCC) on their problem list.  Patient reports cold symptoms since Sunday (5 days). She has taken Mucinex at a dosing schedule less than rec'd..  Contractions: Not present.  .  Movement: Present. Denies leaking of fluid.   The following portions of the patient's history were reviewed and updated as appropriate: allergies, current medications, past family history, past medical history, past social history, past surgical history and problem list. Problem list updated.  Objective:   Vitals:   10/01/17 0940  BP: 113/86  Pulse: 71  Weight: 225 lb (102.1 kg)    Fetal Status: Fetal Heart Rate (bpm): 135   Movement: Present     General:  Alert, oriented and cooperative. Patient is in no acute distress.  Skin: Skin is warm and dry. No rash noted.   Cardiovascular: Normal heart rate noted  Respiratory: Normal respiratory effort, no problems with respiration note, Lungs- CTAB, non- productive cough observed. She is afebrile.  Abdomen: Soft, gravid, appropriate for gestational age.  Pain/Pressure: Absent     Pelvic: Cervical exam deferred        Extremities: Normal range of motion.     Mental Status: Normal mood and affect. Normal behavior. Normal judgment and thought content.   Assessment and Plan:  Pregnancy: G1P0 at [redacted]w[redacted]d  1. Supervision of normal first pregnancy, antepartum   2. Obesity affecting pregnancy, antepartum - 2 hour GTT at next visit  3. Cough in adult patient - Recommed DayQuill BID  Preterm labor symptoms and general  obstetric precautions including but not limited to vaginal bleeding, contractions, leaking of fluid and fetal movement were reviewed in detail with the patient. Please refer to After Visit Summary for other counseling recommendations.  No follow-ups on file.  Future Appointments  Date Time Provider Department Center  10/12/2017  4:30 PM Anyanwu, Jethro Bastos, MD [facility]    Allie Bossier, MD

## 2017-10-01 NOTE — Progress Notes (Signed)
Patient reports having cough and congestion for 3-4 days.

## 2017-10-12 ENCOUNTER — Encounter: Payer: Self-pay | Admitting: Obstetrics & Gynecology

## 2017-10-14 ENCOUNTER — Ambulatory Visit (INDEPENDENT_AMBULATORY_CARE_PROVIDER_SITE_OTHER): Payer: Medicaid Other | Admitting: Obstetrics and Gynecology

## 2017-10-14 ENCOUNTER — Encounter: Payer: Self-pay | Admitting: Obstetrics and Gynecology

## 2017-10-14 VITALS — BP 109/76 | HR 111 | Wt 224.0 lb

## 2017-10-14 DIAGNOSIS — O99210 Obesity complicating pregnancy, unspecified trimester: Secondary | ICD-10-CM

## 2017-10-14 DIAGNOSIS — Z3402 Encounter for supervision of normal first pregnancy, second trimester: Secondary | ICD-10-CM

## 2017-10-14 DIAGNOSIS — O99212 Obesity complicating pregnancy, second trimester: Secondary | ICD-10-CM

## 2017-10-14 DIAGNOSIS — Z3400 Encounter for supervision of normal first pregnancy, unspecified trimester: Secondary | ICD-10-CM

## 2017-10-14 DIAGNOSIS — Z6841 Body Mass Index (BMI) 40.0 and over, adult: Secondary | ICD-10-CM

## 2017-10-14 NOTE — Progress Notes (Signed)
Prenatal Visit Note Date: 10/14/2017 Clinic: [facility]  Subjective:  Yvonne Trujillo is a 32 y.o. G1P0 at 2236w5d being seen today for ongoing prenatal care.  She is currently monitored for the following issues for this high-risk pregnancy and has Migraine; Depression with anxiety; Hirsutism; Acne; History of kidney stones; Hyperlipidemia; Herpes simplex; Obesity affecting pregnancy, antepartum; Supervision of normal first pregnancy, antepartum; and Morbid obesity with BMI of 40.0-44.9, adult (HCC) on their problem list.  Patient reports no complaints.   Contractions: Not present. Vag. Bleeding: None.  Movement: Present. Denies leaking of fluid.   The following portions of the patient's history were reviewed and updated as appropriate: allergies, current medications, past family history, past medical history, past social history, past surgical history and problem list. Problem list updated.  Objective:   Vitals:   10/14/17 0819  BP: 109/76  Pulse: (!) 111  Weight: 224 lb (101.6 kg)    Fetal Status: Fetal Heart Rate (bpm): 147 Fundal Height: 26 cm Movement: Present     General:  Alert, oriented and cooperative. Patient is in no acute distress.  Skin: Skin is warm and dry. No rash noted.   Cardiovascular: Normal heart rate noted  Respiratory: Normal respiratory effort, no problems with respiration noted  Abdomen: Soft, gravid, appropriate for gestational age. Pain/Pressure: Absent     Pelvic:  Cervical exam deferred        Extremities: Normal range of motion.  Edema: None  Mental Status: Normal mood and affect. Normal behavior. Normal judgment and thought content.   Urinalysis:      Assessment and Plan:  Pregnancy: G1P0 at 3136w5d  1. Supervision of normal first pregnancy, antepartum Routine care. Pt considering BC options. 28wk labs nv  2. Morbid obesity with BMI of 40.0-44.9, adult (HCC) Only 4-6lbs gain this pregnancy. D/w her re: goals  3. Obesity affecting  pregnancy, antepartum See above.   Preterm labor symptoms and general obstetric precautions including but not limited to vaginal bleeding, contractions, leaking of fluid and fetal movement were reviewed in detail with the patient. Please refer to After Visit Summary for other counseling recommendations.  Return in about 2 weeks (around 10/28/2017) for rob and 2h GTT.   [place] BingPickens, Chanell Nadeau, MD

## 2017-10-28 ENCOUNTER — Ambulatory Visit (INDEPENDENT_AMBULATORY_CARE_PROVIDER_SITE_OTHER): Payer: Medicaid Other | Admitting: Obstetrics and Gynecology

## 2017-10-28 VITALS — BP 102/72 | HR 92 | Wt 226.2 lb

## 2017-10-28 DIAGNOSIS — B009 Herpesviral infection, unspecified: Secondary | ICD-10-CM

## 2017-10-28 DIAGNOSIS — Z3402 Encounter for supervision of normal first pregnancy, second trimester: Secondary | ICD-10-CM

## 2017-10-28 DIAGNOSIS — Z23 Encounter for immunization: Secondary | ICD-10-CM | POA: Diagnosis not present

## 2017-10-28 DIAGNOSIS — Z3400 Encounter for supervision of normal first pregnancy, unspecified trimester: Secondary | ICD-10-CM

## 2017-10-28 NOTE — Progress Notes (Signed)
Prenatal Visit Note Date: 10/28/2017 Clinic: [facility]  Subjective:  Yvonne Trujillo is a 32 y.o. G1P0 at 311w5d being seen today for ongoing prenatal care.  She is currently monitored for the following issues for this low-risk pregnancy and has Migraine; Depression with anxiety; Hirsutism; Acne; History of kidney stones; Hyperlipidemia; Herpes simplex; Obesity affecting pregnancy, antepartum; Supervision of normal first pregnancy, antepartum; and Morbid obesity with BMI of 40.0-44.9, adult (HCC) on their problem list.  Patient reports no complaints.   Contractions: Not present. Vag. Bleeding: None.  Movement: Present. Denies leaking of fluid.   The following portions of the patient's history were reviewed and updated as appropriate: allergies, current medications, past family history, past medical history, past social history, past surgical history and problem list. Problem list updated.  Objective:   Vitals:   10/28/17 0834  BP: 102/72  Pulse: 92  Weight: 226 lb 3.2 oz (102.6 kg)    Fetal Status: Fetal Heart Rate (bpm): 142 Fundal Height: 28 cm Movement: Present     General:  Alert, oriented and cooperative. Patient is in no acute distress.  Skin: Skin is warm and dry. No rash noted.   Cardiovascular: Normal heart rate noted  Respiratory: Normal respiratory effort, no problems with respiration noted  Abdomen: Soft, gravid, appropriate for gestational age. Pain/Pressure: Absent     Pelvic:  Cervical exam deferred        Extremities: Normal range of motion.  Edema: None  Mental Status: Normal mood and affect. Normal behavior. Normal judgment and thought content.   Urinalysis:      Assessment and Plan:  Pregnancy: G1P0 at 221w5d  1. Encounter for supervision of normal first pregnancy in second trimester Routine care. D/w pt re: BC nv - Glucose Tolerance, 2 Hours w/1 Hour - CBC - HIV antibody - RPR  2. Supervision of normal first pregnancy, antepartum  3.  Herpes simplex ppx at 34-36wks  Preterm labor symptoms and general obstetric precautions including but not limited to vaginal bleeding, contractions, leaking of fluid and fetal movement were reviewed in detail with the patient. Please refer to After Visit Summary for other counseling recommendations.  Return in about 2 weeks (around 11/11/2017) for rob.   [place] BingPickens, [name], MD

## 2017-10-29 LAB — CBC
HEMATOCRIT: 29 % — AB (ref 34.0–46.6)
HEMOGLOBIN: 9.9 g/dL — AB (ref 11.1–15.9)
MCH: 29 pg (ref 26.6–33.0)
MCHC: 34.1 g/dL (ref 31.5–35.7)
MCV: 85 fL (ref 79–97)
Platelets: 350 10*3/uL (ref 150–450)
RBC: 3.41 x10E6/uL — AB (ref 3.77–5.28)
RDW: 13.2 % (ref 12.3–15.4)
WBC: 11.2 10*3/uL — ABNORMAL HIGH (ref 3.4–10.8)

## 2017-10-29 LAB — RPR: RPR Ser Ql: NONREACTIVE

## 2017-10-29 LAB — GLUCOSE TOLERANCE, 2 HOURS W/ 1HR
GLUCOSE, 1 HOUR: 148 mg/dL (ref 65–179)
GLUCOSE, 2 HOUR: 121 mg/dL (ref 65–152)
Glucose, Fasting: 83 mg/dL (ref 65–91)

## 2017-10-29 LAB — HIV ANTIBODY (ROUTINE TESTING W REFLEX): HIV SCREEN 4TH GENERATION: NONREACTIVE

## 2017-11-10 ENCOUNTER — Ambulatory Visit (INDEPENDENT_AMBULATORY_CARE_PROVIDER_SITE_OTHER): Payer: Medicaid Other | Admitting: Student

## 2017-11-10 DIAGNOSIS — Z3400 Encounter for supervision of normal first pregnancy, unspecified trimester: Secondary | ICD-10-CM

## 2017-11-10 DIAGNOSIS — O99013 Anemia complicating pregnancy, third trimester: Secondary | ICD-10-CM

## 2017-11-10 MED ORDER — DOCUSATE SODIUM 100 MG PO CAPS: 100.0000 mg | ORAL_CAPSULE | Freq: Two times a day (BID) | ORAL | 0 refills | Status: DC

## 2017-11-10 MED ORDER — FERROUS FUMARATE 325 (106 FE) MG PO TABS: 1.0000 | ORAL_TABLET | Freq: Two times a day (BID) | ORAL | 0 refills | Status: DC

## 2017-11-10 NOTE — Progress Notes (Signed)
Patient ID: Yvonne Trujillo, female   DOB: 08/02/1985, 32 y.o.   MRN: [id number]   PRENATAL VISIT NOTE  Subjective:  Yvonne Saoni R Stockburger is a 32 y.o. G1P0 at 3937w4d being seen today for ongoing prenatal care.  She is currently monitored for the following issues for this low-risk pregnancy and has Migraine; Depression with anxiety; Hirsutism; Acne; History of kidney stones; Hyperlipidemia; Herpes simplex; Obesity affecting pregnancy, antepartum; Supervision of normal first pregnancy, antepartum; and Morbid obesity with BMI of 40.0-44.9, adult (HCC) on their problem list.  Patient reports slight swellingin her feet. No blurry vision, HA, epigastric pain. .  Contractions: Not present. Vag. Bleeding: None.  Movement: Present. Denies leaking of fluid.   The following portions of the patient's history were reviewed and updated as appropriate: allergies, current medications, past family history, past medical history, past social history, past surgical history and problem list. Problem list updated.  Objective:   Vitals:   11/10/17 1525  BP: 116/79  Pulse: (!) 101  Weight: 227 lb 12.8 oz (103.3 kg)    Fetal Status: Fetal Heart Rate (bpm): 147 Fundal Height: 32 cm Movement: Present     General:  Alert, oriented and cooperative. Patient is in no acute distress.  Skin: Skin is warm and dry. No rash noted.   Cardiovascular: Normal heart rate noted  Respiratory: Normal respiratory effort, no problems with respiration noted  Abdomen: Soft, gravid, appropriate for gestational age.  Pain/Pressure: Present     Pelvic: Cervical exam deferred        Extremities: Normal range of motion.  Edema: Trace  Mental Status: Normal mood and affect. Normal behavior. Normal judgment and thought content.   Assessment and Plan:  Pregnancy: G1P0 at 2437w4d  1. Supervision of normal first pregnancy, antepartum -Doing well; discussed circumcision, still doesn't know what kind of birth control she wants.  -Started on iron twice a  day plus colace and miralax.   Preterm labor symptoms and general obstetric precautions including but not limited to vaginal bleeding, contractions, leaking of fluid and fetal movement were reviewed in detail with the patient. Please refer to After Visit Summary for other counseling recommendations.  Return in about 2 weeks (around 11/24/2017).  Future Appointments  Date Time Provider Department Center  11/25/2017  8:30 AM Reva BoresPratt, Tanya S, MD [facility]    Marylene LandKathryn Lorraine Kooistra, [place]

## 2017-11-10 NOTE — Patient Instructions (Addendum)
-take iron with juice; avoid dairy 30 min before and after taking iron -take colace 100 mg twice a day -Miralax once a day   Breastfeeding Choosing to breastfeed is one of the best decisions you can make for yourself and your baby. A change in hormones during pregnancy causes your breasts to make breast milk in your milk-producing glands. Hormones prevent breast milk from being released before your baby is born. They also prompt milk flow after birth. Once breastfeeding has begun, thoughts of your baby, as well as his or her sucking or crying, can stimulate the release of milk from your milk-producing glands. Benefits of breastfeeding Research shows that breastfeeding offers many health benefits for infants and mothers. It also offers a cost-free and convenient way to feed your baby. For your baby  Your first milk (colostrum) helps your baby's digestive system to function better.  Special cells in your milk (antibodies) help your baby to fight off infections.  Breastfed babies are less likely to develop asthma, allergies, obesity, or type 2 diabetes. They are also at lower risk for sudden infant death syndrome (SIDS).  Nutrients in breast milk are better able to meet your baby's needs compared to infant formula.  Breast milk improves your baby's brain development. For you  Breastfeeding helps to create a very special bond between you and your baby.  Breastfeeding is convenient. Breast milk costs nothing and is always available at the correct temperature.  Breastfeeding helps to burn calories. It helps you to lose the weight that you gained during pregnancy.  Breastfeeding makes your uterus return faster to its size before pregnancy. It also slows bleeding (lochia) after you give birth.  Breastfeeding helps to lower your risk of developing type 2 diabetes, osteoporosis, rheumatoid arthritis, cardiovascular disease, and breast, ovarian, uterine, and endometrial cancer later in  life. Breastfeeding basics Starting breastfeeding  Find a comfortable place to sit or lie down, with your neck and back well-supported.  Place a pillow or a rolled-up blanket under your baby to bring him or her to the level of your breast (if you are seated). Nursing pillows are specially designed to help support your arms and your baby while you breastfeed.  Make sure that your baby's tummy (abdomen) is facing your abdomen.  Gently massage your breast. With your fingertips, massage from the outer edges of your breast inward toward the nipple. This encourages milk flow. If your milk flows slowly, you may need to continue this action during the feeding.  Support your breast with 4 fingers underneath and your thumb above your nipple (make the letter "C" with your hand). Make sure your fingers are well away from your nipple and your baby's mouth.  Stroke your baby's lips gently with your finger or nipple.  When your baby's mouth is open wide enough, quickly bring your baby to your breast, placing your entire nipple and as much of the areola as possible into your baby's mouth. The areola is the colored area around your nipple. ? More areola should be visible above your baby's upper lip than below the lower lip. ? Your baby's lips should be opened and extended outward (flanged) to ensure an adequate, comfortable latch. ? Your baby's tongue should be between his or her lower gum and your breast.  Make sure that your baby's mouth is correctly positioned around your nipple (latched). Your baby's lips should create a seal on your breast and be turned out (everted).  It is common for your baby to  suck about 2-3 minutes in order to start the flow of breast milk. Latching Teaching your baby how to latch onto your breast properly is very important. An improper latch can cause nipple pain, decreased milk supply, and poor weight gain in your baby. Also, if your baby is not latched onto your nipple  properly, he or she may swallow some air during feeding. This can make your baby fussy. Burping your baby when you switch breasts during the feeding can help to get rid of the air. However, teaching your baby to latch on properly is still the best way to prevent fussiness from swallowing air while breastfeeding. Signs that your baby has successfully latched onto your nipple  Silent tugging or silent sucking, without causing you pain. Infant's lips should be extended outward (flanged).  Swallowing heard between every 3-4 sucks once your milk has started to flow (after your let-down milk reflex occurs).  Muscle movement above and in front of his or her ears while sucking.  Signs that your baby has not successfully latched onto your nipple  Sucking sounds or smacking sounds from your baby while breastfeeding.  Nipple pain.  If you think your baby has not latched on correctly, slip your finger into the corner of your baby's mouth to break the suction and place it between your baby's gums. Attempt to start breastfeeding again. Signs of successful breastfeeding Signs from your baby  Your baby will gradually decrease the number of sucks or will completely stop sucking.  Your baby will fall asleep.  Your baby's body will relax.  Your baby will retain a small amount of milk in his or her mouth.  Your baby will let go of your breast by himself or herself.  Signs from you  Breasts that have increased in firmness, weight, and size 1-3 hours after feeding.  Breasts that are softer immediately after breastfeeding.  Increased milk volume, as well as a change in milk consistency and color by the fifth day of breastfeeding.  Nipples that are not sore, cracked, or bleeding.  Signs that your baby is getting enough milk  Wetting at least 1-2 diapers during the first 24 hours after birth.  Wetting at least 5-6 diapers every 24 hours for the first week after birth. The urine should be clear or  pale yellow by the age of 5 days.  Wetting 6-8 diapers every 24 hours as your baby continues to grow and develop.  At least 3 stools in a 24-hour period by the age of 5 days. The stool should be soft and yellow.  At least 3 stools in a 24-hour period by the age of 7 days. The stool should be seedy and yellow.  No loss of weight greater than 10% of birth weight during the first 3 days of life.  Average weight gain of 4-7 oz (113-198 g) per week after the age of 4 days.  Consistent daily weight gain by the age of 5 days, without weight loss after the age of 2 weeks. After a feeding, your baby may spit up a small amount of milk. This is normal. Breastfeeding frequency and duration Frequent feeding will help you make more milk and can prevent sore nipples and extremely full breasts (breast engorgement). Breastfeed when you feel the need to reduce the fullness of your breasts or when your baby shows signs of hunger. This is called "breastfeeding on demand." Signs that your baby is hungry include:  Increased alertness, activity, or restlessness.  Movement of  the head from side to side.  Opening of the mouth when the corner of the mouth or cheek is stroked (rooting).  Increased sucking sounds, smacking lips, cooing, sighing, or squeaking.  Hand-to-mouth movements and sucking on fingers or hands.  Fussing or crying.  Avoid introducing a pacifier to your baby in the first 4-6 weeks after your baby is born. After this time, you may choose to use a pacifier. Research has shown that pacifier use during the first year of a baby's life decreases the risk of sudden infant death syndrome (SIDS). Allow your baby to feed on each breast as long as he or she wants. When your baby unlatches or falls asleep while feeding from the first breast, offer the second breast. Because newborns are often sleepy in the first few weeks of life, you may need to awaken your baby to get him or her to feed. Breastfeeding  times will vary from baby to baby. However, the following rules can serve as a guide to help you make sure that your baby is properly fed:  Newborns (babies 41 weeks of age or younger) may breastfeed every 1-3 hours.  Newborns should not go without breastfeeding for longer than 3 hours during the day or 5 hours during the night.  You should breastfeed your baby a minimum of 8 times in a 24-hour period.  Breast milk pumping Pumping and storing breast milk allows you to make sure that your baby is exclusively fed your breast milk, even at times when you are unable to breastfeed. This is especially important if you go back to work while you are still breastfeeding, or if you are not able to be present during feedings. Your lactation consultant can help you find a method of pumping that works best for you and give you guidelines about how long it is safe to store breast milk. Caring for your breasts while you breastfeed Nipples can become dry, cracked, and sore while breastfeeding. The following recommendations can help keep your breasts moisturized and healthy:  Avoid using soap on your nipples.  Wear a supportive bra designed especially for nursing. Avoid wearing underwire-style bras or extremely tight bras (sports bras).  Air-dry your nipples for 3-4 minutes after each feeding.  Use only cotton bra pads to absorb leaked breast milk. Leaking of breast milk between feedings is normal.  Use lanolin on your nipples after breastfeeding. Lanolin helps to maintain your skin's normal moisture barrier. Pure lanolin is not harmful (not toxic) to your baby. You may also hand express a few drops of breast milk and gently massage that milk into your nipples and allow the milk to air-dry.  In the first few weeks after giving birth, some women experience breast engorgement. Engorgement can make your breasts feel heavy, warm, and tender to the touch. Engorgement peaks within 3-5 days after you give birth. The  following recommendations can help to ease engorgement:  Completely empty your breasts while breastfeeding or pumping. You may want to start by applying warm, moist heat (in the shower or with warm, water-soaked hand towels) just before feeding or pumping. This increases circulation and helps the milk flow. If your baby does not completely empty your breasts while breastfeeding, pump any extra milk after he or she is finished.  Apply ice packs to your breasts immediately after breastfeeding or pumping, unless this is too uncomfortable for you. To do this: ? Put ice in a plastic bag. ? Place a towel between your skin and the  bag. ? Leave the ice on for 20 minutes, 2-3 times a day.  Make sure that your baby is latched on and positioned properly while breastfeeding.  If engorgement persists after 48 hours of following these recommendations, contact your health care provider or a Advertising copywriterlactation consultant. Overall health care recommendations while breastfeeding  Eat 3 healthy meals and 3 snacks every day. Well-nourished mothers who are breastfeeding need an additional 450-500 calories a day. You can meet this requirement by increasing the amount of a balanced diet that you eat.  Drink enough water to keep your urine pale yellow or clear.  Rest often, relax, and continue to take your prenatal vitamins to prevent fatigue, stress, and low vitamin and mineral levels in your body (nutrient deficiencies).  Do not use any products that contain nicotine or tobacco, such as cigarettes and e-cigarettes. Your baby may be harmed by chemicals from cigarettes that pass into breast milk and exposure to secondhand smoke. If you need help quitting, ask your health care provider.  Avoid alcohol.  Do not use illegal drugs or marijuana.  Talk with your health care provider before taking any medicines. These include over-the-counter and prescription medicines as well as vitamins and herbal supplements. Some medicines  that may be harmful to your baby can pass through breast milk.  It is possible to become pregnant while breastfeeding. If birth control is desired, ask your health care provider about options that will be safe while breastfeeding your baby. Where to find more information: [employer]: www.llli.org Contact a health care provider if:  You feel like you want to stop breastfeeding or have become frustrated with breastfeeding.  Your nipples are cracked or bleeding.  Your breasts are red, tender, or warm.  You have: ? Painful breasts or nipples. ? A swollen area on either breast. ? A fever or chills. ? Nausea or vomiting. ? Drainage other than breast milk from your nipples.  Your breasts do not become full before feedings by the fifth day after you give birth.  You feel sad and depressed.  Your baby is: ? Too sleepy to eat well. ? Having trouble sleeping. ? More than 141 week old and wetting fewer than 6 diapers in a 24-hour period. ? Not gaining weight by 305 days of age.  Your baby has fewer than 3 stools in a 24-hour period.  Your baby's skin or the white parts of his or her eyes become yellow. Get help right away if:  Your baby is overly tired (lethargic) and does not want to wake up and feed.  Your baby develops an unexplained fever. Summary  Breastfeeding offers many health benefits for infant and mothers.  Try to breastfeed your infant when he or she shows early signs of hunger.  Gently tickle or stroke your baby's lips with your finger or nipple to allow the baby to open his or her mouth. Bring the baby to your breast. Make sure that much of the areola is in your baby's mouth. Offer one side and burp the baby before you offer the other side.  Talk with your health care provider or lactation consultant if you have questions or you face problems as you breastfeed. This information is not intended to replace advice given to you by your health care provider.  Make sure you discuss any questions you have with your health care provider. Document Released: 04/20/2005 Document Revised: 05/22/2016 Document Reviewed: 05/22/2016 Elsevier Interactive Patient Education  [employer]2018 Elsevier Inc.

## 2017-11-25 ENCOUNTER — Ambulatory Visit (INDEPENDENT_AMBULATORY_CARE_PROVIDER_SITE_OTHER): Payer: Medicaid Other | Admitting: Family Medicine

## 2017-11-25 VITALS — BP 109/70 | HR 72 | Wt 228.2 lb

## 2017-11-25 DIAGNOSIS — F419 Anxiety disorder, unspecified: Secondary | ICD-10-CM

## 2017-11-25 DIAGNOSIS — O99210 Obesity complicating pregnancy, unspecified trimester: Secondary | ICD-10-CM

## 2017-11-25 DIAGNOSIS — O99013 Anemia complicating pregnancy, third trimester: Secondary | ICD-10-CM

## 2017-11-25 DIAGNOSIS — B009 Herpesviral infection, unspecified: Secondary | ICD-10-CM

## 2017-11-25 DIAGNOSIS — Z3400 Encounter for supervision of normal first pregnancy, unspecified trimester: Secondary | ICD-10-CM

## 2017-11-25 NOTE — Patient Instructions (Signed)

## 2017-11-25 NOTE — Progress Notes (Signed)
   PRENATAL VISIT NOTE  Subjective:  Yvonne Trujillo is a 32 y.o. G1P0 at 5969w5d being seen today for ongoing prenatal care.  She is currently monitored for the following issues for this low-risk pregnancy and has Migraine; Depression with anxiety; Hirsutism; Acne; History of kidney stones; Hyperlipidemia; Herpes simplex; Obesity affecting pregnancy, antepartum; Supervision of normal first pregnancy, antepartum; Morbid obesity with BMI of 40.0-44.9, adult (HCC); Anemia affecting pregnancy in third trimester; and Anxiety on their problem list.  Patient reports pelvic pressure.  Contractions: Not present.  .  Movement: Present. Denies leaking of fluid.   The following portions of the patient's history were reviewed and updated as appropriate: allergies, current medications, past family history, past medical history, past social history, past surgical history and problem list. Problem list updated.  Objective:   Vitals:   11/25/17 0833  BP: 109/70  Pulse: 72  Weight: 228 lb 3.2 oz (103.5 kg)    Fetal Status: Fetal Heart Rate (bpm): 143 Fundal Height: 32 cm Movement: Present     General:  Alert, oriented and cooperative. Patient is in no acute distress.  Skin: Skin is warm and dry. No rash noted.   Cardiovascular: Normal heart rate noted  Respiratory: Normal respiratory effort, no problems with respiration noted  Abdomen: Soft, gravid, appropriate for gestational age.  Pain/Pressure: Present     Pelvic: Cervical exam deferred        Extremities: Normal range of motion.  Edema: Trace  Mental Status: Normal mood and affect. Normal behavior. Normal judgment and thought content.   Assessment and Plan:  Pregnancy: G1P0 at 5569w5d  1. Supervision of normal first pregnancy, antepartum Usual discomforts of pregnancy discussed and ways to mitigate these  2. Obesity affecting pregnancy, antepartum   3. Anemia affecting pregnancy in third trimester On bid iron--is constipated.  Preterm labor  symptoms and general obstetric precautions including but not limited to vaginal bleeding, contractions, leaking of fluid and fetal movement were reviewed in detail with the patient. Please refer to After Visit Summary for other counseling recommendations.  Return in 2 weeks (on 12/09/2017).  Future Appointments  Date Time Provider Department Center  12/09/2017  9:45 AM Anyanwu, Jethro BastosUgonna A, MD [facility]    Reva Boresanya S Pratt, MD

## 2017-11-29 ENCOUNTER — Telehealth: Payer: Self-pay

## 2017-11-29 NOTE — Telephone Encounter (Signed)
Patient walk in today reporting of lower back pain and sharp pain around her pelvic area for a couple of days. Patient has not taking anything to help with pain at this time. Advised patient to take tylenol 500 mg for a couple of days to see if she get any relief from pain. Patient blood pressure was 117/72 FHR 132. Patient advised to rest and drink plenty of water. Patient advised to call back with any other concerns..Marland Kitchen

## 2017-12-09 ENCOUNTER — Ambulatory Visit (INDEPENDENT_AMBULATORY_CARE_PROVIDER_SITE_OTHER): Payer: Medicaid Other | Admitting: Obstetrics & Gynecology

## 2017-12-09 VITALS — BP 115/82 | HR 97 | Wt 232.4 lb

## 2017-12-09 DIAGNOSIS — Z3400 Encounter for supervision of normal first pregnancy, unspecified trimester: Secondary | ICD-10-CM

## 2017-12-09 DIAGNOSIS — O99013 Anemia complicating pregnancy, third trimester: Secondary | ICD-10-CM

## 2017-12-09 NOTE — Progress Notes (Signed)
   PRENATAL VISIT NOTE  Subjective:  Yvonne Trujillo is a 32 y.o. G1P0 at 5711w5d being seen today for ongoing prenatal care.  She is currently monitored for the following issues for this low-risk pregnancy and has Migraine; Depression with anxiety; Hirsutism; Acne; History of kidney stones; Hyperlipidemia; Herpes simplex; Obesity affecting pregnancy, antepartum; Supervision of normal first pregnancy, antepartum; Morbid obesity with BMI of 40.0-44.9, adult (HCC); Anemia affecting pregnancy in third trimester; and Anxiety on their problem list.  Patient reports BLE edema, worried about preeclampsia. Denies any headaches, vision chnages, RUQ/epigastric pain.  Contractions: Not present. Vag. Bleeding: None.  Movement: Present. Denies leaking of fluid.   The following portions of the patient's history were reviewed and updated as appropriate: allergies, current medications, past family history, past medical history, past social history, past surgical history and problem list. Problem list updated.  Objective:   Vitals:   12/09/17 0958  BP: 115/82  Pulse: 97  Weight: 232 lb 6.4 oz (105.4 kg)    Fetal Status: Fetal Heart Rate (bpm): 150 Fundal Height: 34 cm Movement: Present     General:  Alert, oriented and cooperative. Patient is in no acute distress.  Skin: Skin is warm and dry. No rash noted.   Cardiovascular: Normal heart rate noted  Respiratory: Normal respiratory effort, no problems with respiration noted  Abdomen: Soft, gravid, appropriate for gestational age.  Pain/Pressure: Present     Pelvic: Cervical exam deferred        Extremities: Normal range of motion.  Edema: Moderate pitting, indentation subsides rapidly  Mental Status: Normal mood and affect. Normal behavior. Normal judgment and thought content.   CBC Latest Ref Rng & Units 10/28/2017 06/23/2017 05/18/2017  WBC 3.4 - 10.8 x10E3/uL 11.2(H) 10.7 13.9(H)  Hemoglobin 11.1 - 15.9 g/dL 1.4(N9.9(L) 82.912.1 11.9(L)  Hematocrit 34.0 - 46.6 %  29.0(L) 35.8 34.7(L)  Platelets 150 - 450 x10E3/uL 350 406(H) 456(H)    Assessment and Plan:  Pregnancy: G1P0 at 2411w5d  1. Anemia affecting pregnancy in third trimester Not taking oral iron as prescribed, will recheck labs and consider Feraheme if needed. - CBC - Ferritin  2. Supervision of normal first pregnancy, antepartum Preterm labor symptoms and general obstetric precautions including but not limited to vaginal bleeding, contractions, leaking of fluid and fetal movement were reviewed in detail with the patient. Please refer to After Visit Summary for other counseling recommendations.  Return in about 2 weeks (around 12/23/2017) for OB Visit, Pelvic cultures.   Jaynie CollinsUgonna Addyson Traub, MD

## 2017-12-09 NOTE — Patient Instructions (Signed)
Return to clinic for any scheduled appointments or obstetric concerns, or go to [facility] for evaluation  

## 2017-12-10 LAB — CBC
HEMATOCRIT: 30.3 % — AB (ref 34.0–46.6)
HEMOGLOBIN: 10.1 g/dL — AB (ref 11.1–15.9)
MCH: 28.1 pg (ref 26.6–33.0)
MCHC: 33.3 g/dL (ref 31.5–35.7)
MCV: 84 fL (ref 79–97)
Platelets: 328 10*3/uL (ref 150–450)
RBC: 3.6 x10E6/uL — AB (ref 3.77–5.28)
RDW: 13.5 % (ref 12.3–15.4)
WBC: 11.6 10*3/uL — ABNORMAL HIGH (ref 3.4–10.8)

## 2017-12-10 LAB — FERRITIN: Ferritin: 13 ng/mL — ABNORMAL LOW (ref 15–150)

## 2017-12-22 ENCOUNTER — Ambulatory Visit (INDEPENDENT_AMBULATORY_CARE_PROVIDER_SITE_OTHER): Payer: Medicaid Other | Admitting: Student

## 2017-12-22 VITALS — BP 124/86 | HR 89 | Wt 237.6 lb

## 2017-12-22 DIAGNOSIS — O99013 Anemia complicating pregnancy, third trimester: Secondary | ICD-10-CM

## 2017-12-22 DIAGNOSIS — Z3400 Encounter for supervision of normal first pregnancy, unspecified trimester: Secondary | ICD-10-CM

## 2017-12-22 NOTE — Patient Instructions (Signed)
Group B Streptococcus Infection During Pregnancy Group B Streptococcus (GBS) is a type of bacteria (Streptococcus agalactiae) that is often found in healthy people, commonly in the rectum, vagina, and intestines. In people who are healthy and not pregnant, the bacteria rarely cause serious illness or complications. However, women who test positive for GBS during pregnancy can pass the bacteria to their baby during childbirth, which can cause serious infection in the baby after birth. Women with GBS may also have infections during their pregnancy or immediately after childbirth, such as such as urinary tract infections (UTIs) or infections of the uterus (uterine infections). Having GBS also increases a woman's risk of complications during pregnancy, such as early (preterm) labor or delivery, miscarriage, or stillbirth. Routine testing (screening) for GBS is recommended for all pregnant women. What increases the risk? You may have a higher risk for GBS infection during pregnancy if you had one during a past pregnancy. What are the signs or symptoms? In most cases, GBS infection does not cause symptoms in pregnant women. Signs and symptoms of a possible GBS-related infection may include:  Labor starting before the 37th week of pregnancy.  A UTI or bladder infection, which may cause: ? Fever. ? Pain or burning during urination. ? Frequent urination.  Fever during labor, along with: ? Bad-smelling discharge. ? Uterine tenderness. ? Rapid heartbeat in the mother, baby, or both.  Rare but serious symptoms of a possible GBS-related infection in women include:  Blood infection (septicemia). This may cause fever, chills, or confusion.  Lung infection (pneumonia). This may cause fever, chills, cough, rapid breathing, difficulty breathing, or chest pain.  Bone, joint, skin, or soft tissue infection.  How is this diagnosed? You may be screened for GBS between week 35 and week 37 of your pregnancy. If  you have symptoms of preterm labor, you may be screened earlier. This condition is diagnosed based on lab test results from:  A swab of fluid from the vagina and rectum.  A urine sample.  How is this treated? This condition is treated with antibiotic medicine. When you go into labor, or as soon as your water breaks (your membranes rupture), you will be given antibiotics through an IV tube. Antibiotics will continue until after you give birth. If you are having a cesarean delivery, you do not need antibiotics unless your membranes have already ruptured. Follow these instructions at home:  Take over-the-counter and prescription medicines only as told by your health care provider.  Take your antibiotic medicine as told by your health care provider. Do not stop taking the antibiotic even if you start to feel better.  Keep all pre-birth (prenatal) visits and follow-up visits as told by your health care provider. This is important. Contact a health care provider if:  You have pain or burning when you urinate.  You have to urinate frequently.  You have a fever or chills.  You develop a bad-smelling vaginal discharge. Get help right away if:  Your membranes rupture.  You go into labor.  You have severe pain in your abdomen.  You have difficulty breathing.  You have chest pain. This information is not intended to replace advice given to you by your health care provider. Make sure you discuss any questions you have with your health care provider. Document Released: 07/28/2007 Document Revised: 11/15/2015 Document Reviewed: 11/14/2015 Elsevier Interactive Patient Education  2018 Elsevier Inc.  

## 2017-12-22 NOTE — Progress Notes (Signed)
   PRENATAL VISIT NOTE  Subjective:  Yvonne Trujillo is a 32 y.o. G1P0 at 11049w4d being seen today for ongoing prenatal care.  She is currently monitored for the following issues for this low-risk pregnancy and has Migraine; Depression with anxiety; Hirsutism; Acne; History of kidney stones; Hyperlipidemia; Herpes simplex; Obesity affecting pregnancy, antepartum; Supervision of normal first pregnancy, antepartum; Morbid obesity with BMI of 40.0-44.9, adult (HCC); Anemia affecting pregnancy in third trimester; and Anxiety on their problem list.  Patient reports swelling in her feet. She stands all day at her job and the swelling is worse when she is at work or not wearing her compression hose. Denies blurry vision, HA. Checks her BP at home and it has been normal. .  Contractions: Irregular. Vag. Bleeding: None.  Movement: Present. Denies leaking of fluid.   The following portions of the patient's history were reviewed and updated as appropriate: allergies, current medications, past family history, past medical history, past social history, past surgical history and problem list. Problem list updated.  Objective:   Vitals:   12/22/17 1611  BP: 124/86  Pulse: 89  Weight: 237 lb 9.6 oz (107.8 kg)    Fetal Status: Fetal Heart Rate (bpm): 138 Fundal Height: 36 cm Movement: Present     General:  Alert, oriented and cooperative. Patient is in no acute distress.  Skin: Skin is warm and dry. No rash noted.   Cardiovascular: Normal heart rate noted  Respiratory: Normal respiratory effort, no problems with respiration noted  Abdomen: Soft, gravid, appropriate for gestational age.  Pain/Pressure: Present     Pelvic: Cervical exam deferred        Extremities: Normal range of motion.  Edema: Moderate pitting, indentation subsides rapidly  Mental Status: Normal mood and affect. Normal behavior. Normal judgment and thought content.   Assessment and Plan:  Pregnancy: G1P0 at 5749w4d  1. Supervision of normal  first pregnancy, antepartum -Discussed warning signs of labor; will do GBS and STI cultures next week.  -Discussed importance of hydration, movement and rest to reduce edema. Patient will try to incorporate these into her daily activities.   2. Anemia affecting pregnancy in third trimester -Still taking iron.   Preterm labor symptoms and general obstetric precautions including but not limited to vaginal bleeding, contractions, leaking of fluid and fetal movement were reviewed in detail with the patient. Please refer to After Visit Summary for other counseling recommendations.  Return in about 1 week (around 12/29/2017).  Future Appointments  Date Time Provider Department Center  12/28/2017  9:00 AM [place] BingPickens, Charlie, MD [facility]  01/04/2018  8:15 AM [place], Jethro BastosUgonna A, MD [facility]  01/11/2018  8:15 AM [place] BingPickens, Charlie, MD [facility]  01/18/2018  9:15 AM Reva BoresPratt, Tanya S, MD [facility]    Marylene LandKathryn Lorraine Sayde Lish, CNM

## 2017-12-28 ENCOUNTER — Ambulatory Visit (INDEPENDENT_AMBULATORY_CARE_PROVIDER_SITE_OTHER): Payer: Medicaid Other | Admitting: Obstetrics and Gynecology

## 2017-12-28 ENCOUNTER — Other Ambulatory Visit (HOSPITAL_COMMUNITY)
Admission: RE | Admit: 2017-12-28 | Discharge: 2017-12-28 | Disposition: A | Discharge status: Home / Self Care | Payer: Medicaid Other | Source: Ambulatory Visit | Attending: Obstetrics and Gynecology | Admitting: Obstetrics and Gynecology

## 2017-12-28 VITALS — BP 116/79 | HR 78 | Wt 240.9 lb

## 2017-12-28 DIAGNOSIS — Z3A00 Weeks of gestation of pregnancy not specified: Secondary | ICD-10-CM | POA: Insufficient documentation

## 2017-12-28 DIAGNOSIS — Z3400 Encounter for supervision of normal first pregnancy, unspecified trimester: Secondary | ICD-10-CM

## 2017-12-28 NOTE — Progress Notes (Signed)
Will get flu  Injection at work

## 2017-12-28 NOTE — Progress Notes (Signed)
Prenatal Visit Note Date: 12/28/2017 Clinic: [facility]  Subjective:  Yvonne Trujillo is a 32 y.o. G1P0 at 3753w3d being seen today for ongoing prenatal care.  She is currently monitored for the following issues for this low-risk pregnancy and has Migraine; Depression with anxiety; Hirsutism; Acne; History of kidney stones; Hyperlipidemia; Herpes simplex; Obesity affecting pregnancy, antepartum; Supervision of normal first pregnancy, antepartum; Morbid obesity with BMI of 40.0-44.9, adult (HCC); Anemia affecting pregnancy in third trimester; and Anxiety on their problem list.  Patient reports no complaints.   Contractions: Irregular. Vag. Bleeding: None.  Movement: Present. Denies leaking of fluid.   The following portions of the patient's history were reviewed and updated as appropriate: allergies, current medications, past family history, past medical history, past social history, past surgical history and problem list. Problem list updated.  Objective:   Vitals:   12/28/17 0857  BP: 116/79  Pulse: 78  Weight: 240 lb 14.4 oz (109.3 kg)    Fetal Status: Fetal Heart Rate (bpm): 135 Fundal Height: 37 cm Movement: Present  Presentation: Vertex  General:  Alert, oriented and cooperative. Patient is in no acute distress.  Skin: Skin is warm and dry. No rash noted.   Cardiovascular: Normal heart rate noted  Respiratory: Normal respiratory effort, no problems with respiration noted  Abdomen: Soft, gravid, appropriate for gestational age. Pain/Pressure: Present     Pelvic:  Cervical exam performed Dilation: Closed Effacement (%): Thick Station: Ballotable  Extremities: Normal range of motion.  Edema: Moderate pitting, indentation subsides rapidly  Mental Status: Normal mood and affect. Normal behavior. Normal judgment and thought content.   Urinalysis:      Assessment and Plan:  Pregnancy: G1P0 at 5553w3d  1. Supervision of normal first pregnancy, antepartum Routine care.  GBS  Preterm labor symptoms and general obstetric precautions including but not limited to vaginal bleeding, contractions, leaking of fluid and fetal movement were reviewed in detail with the patient. Please refer to After Visit Summary for other counseling recommendations.  Return in about 1 week (around 01/04/2018) for 7-10d rob.   [place] BingPickens, Macallister Ashmead, MD

## 2017-12-29 ENCOUNTER — Inpatient Hospital Stay (HOSPITAL_COMMUNITY)
Admission: AD | Admit: 2017-12-29 | Discharge: 2017-12-30 | Disposition: A | Discharge status: Home / Self Care | Payer: Medicaid Other | Source: Ambulatory Visit | Attending: Obstetrics and Gynecology | Admitting: Obstetrics and Gynecology

## 2017-12-29 ENCOUNTER — Encounter (HOSPITAL_COMMUNITY): Payer: Self-pay

## 2017-12-29 DIAGNOSIS — Z3A36 36 weeks gestation of pregnancy: Secondary | ICD-10-CM

## 2017-12-29 DIAGNOSIS — Y30XXXA Falling, jumping or pushed from a high place, undetermined intent, initial encounter: Secondary | ICD-10-CM | POA: Insufficient documentation

## 2017-12-29 DIAGNOSIS — Z8249 Family history of ischemic heart disease and other diseases of the circulatory system: Secondary | ICD-10-CM | POA: Insufficient documentation

## 2017-12-29 DIAGNOSIS — Z79899 Other long term (current) drug therapy: Secondary | ICD-10-CM | POA: Insufficient documentation

## 2017-12-29 DIAGNOSIS — O99343 Other mental disorders complicating pregnancy, third trimester: Secondary | ICD-10-CM | POA: Insufficient documentation

## 2017-12-29 DIAGNOSIS — Z87891 Personal history of nicotine dependence: Secondary | ICD-10-CM | POA: Insufficient documentation

## 2017-12-29 DIAGNOSIS — Z811 Family history of alcohol abuse and dependence: Secondary | ICD-10-CM | POA: Insufficient documentation

## 2017-12-29 DIAGNOSIS — Z8349 Family history of other endocrine, nutritional and metabolic diseases: Secondary | ICD-10-CM | POA: Insufficient documentation

## 2017-12-29 DIAGNOSIS — W19XXXA Unspecified fall, initial encounter: Secondary | ICD-10-CM | POA: Insufficient documentation

## 2017-12-29 DIAGNOSIS — Z7982 Long term (current) use of aspirin: Secondary | ICD-10-CM | POA: Insufficient documentation

## 2017-12-29 DIAGNOSIS — O99513 Diseases of the respiratory system complicating pregnancy, third trimester: Secondary | ICD-10-CM | POA: Insufficient documentation

## 2017-12-29 DIAGNOSIS — Z9889 Other specified postprocedural states: Secondary | ICD-10-CM | POA: Insufficient documentation

## 2017-12-29 DIAGNOSIS — Y92009 Unspecified place in unspecified non-institutional (private) residence as the place of occurrence of the external cause: Secondary | ICD-10-CM | POA: Insufficient documentation

## 2017-12-29 DIAGNOSIS — J45909 Unspecified asthma, uncomplicated: Secondary | ICD-10-CM | POA: Insufficient documentation

## 2017-12-29 DIAGNOSIS — O9A213 Injury, poisoning and certain other consequences of external causes complicating pregnancy, third trimester: Secondary | ICD-10-CM | POA: Insufficient documentation

## 2017-12-29 DIAGNOSIS — Z833 Family history of diabetes mellitus: Secondary | ICD-10-CM | POA: Insufficient documentation

## 2017-12-29 DIAGNOSIS — S7001XA Contusion of right hip, initial encounter: Secondary | ICD-10-CM | POA: Insufficient documentation

## 2017-12-29 LAB — CERVICOVAGINAL ANCILLARY ONLY
Chlamydia: NEGATIVE
NEISSERIA GONORRHEA: NEGATIVE

## 2017-12-29 NOTE — MAU Note (Signed)
Was hanging a shower curtain and fell and hit her foot and hip, uncertain whether she hit her stomach or not.  No VB.  Fell around 2130.  + FM.  No CTX.

## 2017-12-29 NOTE — MAU Provider Note (Signed)
Chief Complaint:  Fall   First Provider Initiated Contact with Patient 12/29/17 2332     HPI: Yvonne Trujillo is a 32 y.o. G1P0 at 1636w4dwho presents to maternity admissions reporting fall at home, landing on right hip.  No bleeding or contractions.  Hip sore. She reports good fetal movement, denies LOF, vaginal bleeding, vaginal itching/burning, urinary symptoms, h/a, dizziness, n/v, diarrhea, constipation or fever/chills.  .  Fall  The accident occurred 1 to 3 hours ago. The fall occurred while standing. She fell from a height of 3 to 5 ft. She landed on hard floor. There was no blood loss. The point of impact was the right hip. The pain is present in the right hip. The pain is moderate. The symptoms are aggravated by pressure on injury. Pertinent negatives include no abdominal pain, bowel incontinence, fever, headaches, numbness, tingling or visual change. She has tried nothing for the symptoms.    RN Note: Was hanging a shower curtain and fell and hit her foot and hip, uncertain whether she hit her stomach or not.  No VB.  Fell around 2130.  + FM.  No CTX.   Past Medical History: Past Medical History:  Diagnosis Date  . Asthma   . Concussion   . History of depression   . Kidney infection   . Kidney stone   . Migraine   . Supervision of normal first pregnancy, antepartum 06/23/2017    [facility]atal [facility] Dating  Blood type:    Genetic Screen 1 Screen:    AFP:     Quad:     NIPS: Antibody:  Anatomic US  Rubella:   GTT Early:               Third trimester:  RPR:    Flu vaccine  HBsAg:    TDaP vaccine                                               Rhogam: HIV:   Baby Food        Breast                                        GBS: (For PCN allergy, check sensitivities) Cont    Past obstetric history: OB History  Gravida Para Term Preterm AB Living  1            SAB TAB Ectopic Multiple Live Births               # Outcome Date GA Lbr Len/2nd Weight Sex Delivery Anes PTL Lv  1  Current             Past Surgical History: Past Surgical History:  Procedure Laterality Date  . APPENDECTOMY    . EXTRACORPOREAL SHOCK WAVE LITHOTRIPSY Left 07/09/2016   Procedure: EXTRACORPOREAL SHOCK WAVE LITHOTRIPSY (ESWL);  Surgeon: Vanna ScotlandAshley Brandon, MD;  Location: [facility] ORS;  Service: Urology;  Laterality: Left;    Family History: Family History  Problem Relation Age of Onset  . Alcohol abuse Maternal Uncle   . Hyperlipidemia Mother   . Hypertension Mother   . Heart disease Mother   . Hyperlipidemia Father   . Hypertension Father   . Diabetes Cousin   . Bladder Cancer  Neg Hx   . Kidney cancer Neg Hx     Social History: Social History   Tobacco Use  . Smoking status: Former Smoker    Years: 6.00    Types: Cigarettes  . Smokeless tobacco: Never Used  Substance Use Topics  . Alcohol use: Yes    Alcohol/week: 0.0 standard drinks    Comment: none with preg  . Drug use: Yes    Types: Marijuana    Comment: none with preg    Allergies: No Known Allergies  Meds:  Medications Prior to Admission  Medication Sig Dispense Refill Last Dose  . albuterol (PROVENTIL HFA;VENTOLIN HFA) 108 (90 Base) MCG/ACT inhaler Inhale 2 puffs into the lungs every 6 (six) hours as needed. 1 Inhaler 0 Taking  . aspirin EC 81 MG tablet Take 1 tablet (81 mg total) by mouth daily. Take after 12 weeks for prevention of preeclampsia later in pregnancy 300 tablet 2 Taking  . cyclobenzaprine (FLEXERIL) 10 MG tablet Take 1 tablet (10 mg total) by mouth every 8 (eight) hours as needed for muscle spasms. 30 tablet 1 Taking  . docusate sodium (COLACE) 100 MG capsule Take 1 capsule (100 mg total) by mouth 2 (two) times daily. 10 capsule 0 Taking  . ferrous fumarate (HEMOCYTE - 106 MG FE) 325 (106 Fe) MG TABS tablet Take 1 tablet (106 mg of iron total) by mouth 2 (two) times daily. 30 each 0 Taking  . Prenatal Multivit-Min-Fe-FA (PRENATAL VITAMINS) 0.8 MG tablet Take 1 tablet by mouth daily. 30 tablet 12 Taking   . promethazine (PHENERGAN) 25 MG tablet Take 0.5 tablets (12.5 mg total) by mouth every 6 (six) hours as needed for nausea or vomiting (migraines). 30 tablet 2 Taking  . valACYclovir (VALTREX) 1000 MG tablet TAKE 2 TABLETS BY MOUTH 2 TIMES A DAY FOR 1 DAY 20 tablet 1 Taking    I have reviewed patient's Past Medical Hx, Surgical Hx, Family Hx, Social Hx, medications and allergies.   ROS:  Review of Systems  Constitutional: Negative for fever.  Gastrointestinal: Negative for abdominal pain and bowel incontinence.  Neurological: Negative for tingling, numbness and headaches.   Other systems negative  Physical Exam   Patient Vitals for the past 24 hrs:  BP Temp Pulse Resp SpO2 Height Weight  12/29/17 2300 118/74 - 77 - - - -  12/29/17 2250 119/77 - 80 - - - -  12/29/17 2230 - 98.3 F (36.8 C) 79 19 100 % 5\' 1"  (1.549 m) 110.3 kg   Constitutional: Well-developed, well-nourished female in no acute distress.  Cardiovascular: normal rate and rhythm Respiratory: normal effort, clear to auscultation bilaterally GI: Abd soft, non-tender, gravid appropriate for gestational age.   No rebound or guarding. MS: Extremities nontender, no edema, normal ROM  Right hip tender  Neurologic: Alert and oriented x 4.  GU: Neg CVAT.  PELVIC EXAM:  deferred  FHT:  Baseline 140 , moderate variability, accelerations present, no decelerations Contractions:  Rare   Labs: A/Positive/-- (02/20 1045) No results found for this or any previous visit (from the past 24 hour(s)).  Imaging:  No results found.  MAU Course/MDM: NST reviewed for the entire monitoring time.  Baby has remained reactive throughout.  .  Treatments in MAU included EFM, prolonged.    Assessment: Single intrauterine pregnancy at 103w5d S/P fall on to hip Contusion right hip Reassuring fetal heart rate tracing  Plan: Discharge home Labor precautions and fetal kick counts Follow up in Office for prenatal  visits and recheck of  status Ice to hip x 24 hrs Encouraged to return here or to other Urgent Care/ED if she develops worsening of symptoms, increase in pain, fever, or other concerning symptoms.   Pt stable at time of discharge.  Wynelle Bourgeois CNM, MSN Certified Nurse-Midwife 12/29/2017 11:32 PM

## 2017-12-30 DIAGNOSIS — Z811 Family history of alcohol abuse and dependence: Secondary | ICD-10-CM | POA: Diagnosis not present

## 2017-12-30 DIAGNOSIS — Z79899 Other long term (current) drug therapy: Secondary | ICD-10-CM | POA: Diagnosis not present

## 2017-12-30 DIAGNOSIS — S7001XA Contusion of right hip, initial encounter: Secondary | ICD-10-CM | POA: Diagnosis present

## 2017-12-30 DIAGNOSIS — J45909 Unspecified asthma, uncomplicated: Secondary | ICD-10-CM | POA: Diagnosis not present

## 2017-12-30 DIAGNOSIS — Y92009 Unspecified place in unspecified non-institutional (private) residence as the place of occurrence of the external cause: Secondary | ICD-10-CM | POA: Diagnosis not present

## 2017-12-30 DIAGNOSIS — Z8249 Family history of ischemic heart disease and other diseases of the circulatory system: Secondary | ICD-10-CM | POA: Diagnosis not present

## 2017-12-30 DIAGNOSIS — Z9889 Other specified postprocedural states: Secondary | ICD-10-CM | POA: Diagnosis not present

## 2017-12-30 DIAGNOSIS — Z8349 Family history of other endocrine, nutritional and metabolic diseases: Secondary | ICD-10-CM | POA: Diagnosis not present

## 2017-12-30 DIAGNOSIS — Z833 Family history of diabetes mellitus: Secondary | ICD-10-CM | POA: Diagnosis not present

## 2017-12-30 DIAGNOSIS — Y30XXXA Falling, jumping or pushed from a high place, undetermined intent, initial encounter: Secondary | ICD-10-CM | POA: Diagnosis not present

## 2017-12-30 DIAGNOSIS — O99513 Diseases of the respiratory system complicating pregnancy, third trimester: Secondary | ICD-10-CM | POA: Diagnosis not present

## 2017-12-30 DIAGNOSIS — Z87891 Personal history of nicotine dependence: Secondary | ICD-10-CM | POA: Diagnosis not present

## 2017-12-30 DIAGNOSIS — O99343 Other mental disorders complicating pregnancy, third trimester: Secondary | ICD-10-CM | POA: Diagnosis not present

## 2017-12-30 DIAGNOSIS — Z7982 Long term (current) use of aspirin: Secondary | ICD-10-CM | POA: Diagnosis not present

## 2017-12-30 DIAGNOSIS — W19XXXA Unspecified fall, initial encounter: Secondary | ICD-10-CM | POA: Diagnosis not present

## 2017-12-30 DIAGNOSIS — Z3A36 36 weeks gestation of pregnancy: Secondary | ICD-10-CM | POA: Diagnosis not present

## 2017-12-30 DIAGNOSIS — O9A213 Injury, poisoning and certain other consequences of external causes complicating pregnancy, third trimester: Secondary | ICD-10-CM | POA: Diagnosis not present

## 2017-12-30 LAB — STREP GP B NAA: STREP GROUP B AG: NEGATIVE

## 2017-12-30 NOTE — Discharge Instructions (Signed)
Contusion A contusion is a deep bruise. Contusions happen when an injury causes bleeding under the skin. Symptoms of bruising include pain, swelling, and discolored skin. The skin may turn blue, purple, or yellow. Follow these instructions at home:  Rest the injured area.  If told, put ice on the injured area. ? Put ice in a plastic bag. ? Place a towel between your skin and the bag. ? Leave the ice on for 20 minutes, 2-3 times per day.  If told, put light pressure (compression) on the injured area using an elastic bandage. Make sure the bandage is not too tight. Remove it and put it back on as told by your doctor.  If possible, raise (elevate) the injured area above the level of your heart while you are sitting or lying down.  Take over-the-counter and prescription medicines only as told by your doctor. Contact a doctor if:  Your symptoms do not get better after several days of treatment.  Your symptoms get worse.  You have trouble moving the injured area. Get help right away if:  You have very bad pain.  You have a loss of feeling (numbness) in a hand or foot.  Your hand or foot turns pale or cold. This information is not intended to replace advice given to you by your health care provider. Make sure you discuss any questions you have with your health care provider. Document Released: 10/07/2007 Document Revised: 09/26/2015 Document Reviewed: 09/05/2014 Elsevier Interactive Patient Education  2018 [employer].  Fall Prevention in the Home Falls can cause injuries. They can happen to people of all ages. There are many things you can do to make your home safe and to help prevent falls. What can I do on the outside of my home?  Regularly fix the edges of walkways and driveways and fix any cracks.  Remove anything that might make you trip as you walk through a door, such as a raised step or threshold.  Trim any bushes or trees on the path to your home.  Use bright outdoor  lighting.  Clear any walking paths of anything that might make someone trip, such as rocks or tools.  Regularly check to see if handrails are loose or broken. Make sure that both sides of any steps have handrails.  Any raised decks and porches should have guardrails on the edges.  Have any leaves, snow, or ice cleared regularly.  Use sand or salt on walking paths during winter.  Clean up any spills in your garage right away. This includes oil or grease spills. What can I do in the bathroom?  Use night lights.  Install grab bars by the toilet and in the tub and shower. Do not use towel bars as grab bars.  Use non-skid mats or decals in the tub or shower.  If you need to sit down in the shower, use a plastic, non-slip stool.  Keep the floor dry. Clean up any water that spills on the floor as soon as it happens.  Remove soap buildup in the tub or shower regularly.  Attach bath mats securely with double-sided non-slip rug tape.  Do not have throw rugs and other things on the floor that can make you trip. What can I do in the bedroom?  Use night lights.  Make sure that you have a light by your bed that is easy to reach.  Do not use any sheets or blankets that are too big for your bed. They should not hang  down onto the floor.  Have a firm chair that has side arms. You can use this for support while you get dressed.  Do not have throw rugs and other things on the floor that can make you trip. What can I do in the kitchen?  Clean up any spills right away.  Avoid walking on wet floors.  Keep items that you use a lot in easy-to-reach places.  If you need to reach something above you, use a strong step stool that has a grab bar.  Keep electrical cords out of the way.  Do not use floor polish or wax that makes floors slippery. If you must use wax, use non-skid floor wax.  Do not have throw rugs and other things on the floor that can make you trip. What can I do with my  stairs?  Do not leave any items on the stairs.  Make sure that there are handrails on both sides of the stairs and use them. Fix handrails that are broken or loose. Make sure that handrails are as long as the stairways.  Check any carpeting to make sure that it is firmly attached to the stairs. Fix any carpet that is loose or worn.  Avoid having throw rugs at the top or bottom of the stairs. If you do have throw rugs, attach them to the floor with carpet tape.  Make sure that you have a light switch at the top of the stairs and the bottom of the stairs. If you do not have them, ask someone to add them for you. What else can I do to help prevent falls?  Wear shoes that: ? Do not have high heels. ? Have rubber bottoms. ? Are comfortable and fit you well. ? Are closed at the toe. Do not wear sandals.  If you use a stepladder: ? Make sure that it is fully opened. Do not climb a closed stepladder. ? Make sure that both sides of the stepladder are locked into place. ? Ask someone to hold it for you, if possible.  Clearly mark and make sure that you can see: ? Any grab bars or handrails. ? First and last steps. ? Where the edge of each step is.  Use tools that help you move around (mobility aids) if they are needed. These include: ? Canes. ? Walkers. ? Scooters. ? Crutches.  Turn on the lights when you go into a dark area. Replace any light bulbs as soon as they burn out.  Set up your furniture so you have a clear path. Avoid moving your furniture around.  If any of your floors are uneven, fix them.  If there are any pets around you, be aware of where they are.  Review your medicines with your doctor. Some medicines can make you feel dizzy. This can increase your chance of falling. Ask your doctor what other things that you can do to help prevent falls. This information is not intended to replace advice given to you by your health care provider. Make sure you discuss any  questions you have with your health care provider. Document Released: 02/14/2009 Document Revised: 09/26/2015 Document Reviewed: 05/25/2014 Elsevier Interactive Patient Education  [employer].  Third Trimester of Pregnancy The third trimester is from week 29 through week 42, months 7 through 9. This trimester is when your unborn baby (fetus) is growing very fast. At the end of the ninth month, the unborn baby is about 20 inches in length. It weighs about  6-10 pounds. Follow these instructions at home:  Avoid all smoking, herbs, and alcohol. Avoid drugs not approved by your doctor.  Do not use any tobacco products, including cigarettes, chewing tobacco, and electronic cigarettes. If you need help quitting, ask your doctor. You may get counseling or other support to help you quit.  Only take medicine as told by your doctor. Some medicines are safe and some are not during pregnancy.  Exercise only as told by your doctor. Stop exercising if you start having cramps.  Eat regular, healthy meals.  Wear a good support bra if your breasts are tender.  Do not use hot tubs, steam rooms, or saunas.  Wear your seat belt when driving.  Avoid raw meat, uncooked cheese, and liter boxes and soil used by cats.  Take your prenatal vitamins.  Take 1500-2000 milligrams of calcium daily starting at the 20th week of pregnancy until you deliver your baby.  Try taking medicine that helps you poop (stool softener) as needed, and if your doctor approves. Eat more fiber by eating fresh fruit, vegetables, and whole grains. Drink enough fluids to keep your pee (urine) clear or pale yellow.  Take warm water baths (sitz baths) to soothe pain or discomfort caused by hemorrhoids. Use hemorrhoid cream if your doctor approves.  If you have puffy, bulging veins (varicose veins), wear support hose. Raise (elevate) your feet for 15 minutes, 3-4 times a day. Limit salt in your diet.  Avoid heavy lifting, wear low  heels, and sit up straight.  Rest with your legs raised if you have leg cramps or low back pain.  Visit your dentist if you have not gone during your pregnancy. Use a soft toothbrush to brush your teeth. Be gentle when you floss.  You can have sex (intercourse) unless your doctor tells you not to.  Do not travel far distances unless you must. Only do so with your doctor's approval.  Take prenatal classes.  Practice driving to the hospital.  Pack your hospital bag.  Prepare the baby's room.  Go to your doctor visits. Get help if:  You are not sure if you are in labor or if your water has broken.  You are dizzy.  You have mild cramps or pressure in your lower belly (abdominal).  You have a nagging pain in your belly area.  You continue to feel sick to your stomach (nauseous), throw up (vomit), or have watery poop (diarrhea).  You have bad smelling fluid coming from your vagina.  You have pain with peeing (urination). Get help right away if:  You have a fever.  You are leaking fluid from your vagina.  You are spotting or bleeding from your vagina.  You have severe belly cramping or pain.  You lose or gain weight rapidly.  You have trouble catching your breath and have chest pain.  You notice sudden or extreme puffiness (swelling) of your face, hands, ankles, feet, or legs.  You have not felt the baby move in over an hour.  You have severe headaches that do not go away with medicine.  You have vision changes. This information is not intended to replace advice given to you by your health care provider. Make sure you discuss any questions you have with your health care provider. Document Released: 07/15/2009 Document Revised: 09/26/2015 Document Reviewed: 06/21/2012 Elsevier Interactive Patient Education  2017 [employer]

## 2018-01-03 NOTE — Progress Notes (Signed)
   PRENATAL VISIT NOTE  Subjective:  Yvonne Trujillo is a 32 y.o. G1P0 at 104w3d being seen today for ongoing prenatal care.  She is currently monitored for the following issues for this low-risk pregnancy and has Migraine; Depression with anxiety; Hirsutism; Acne; History of kidney stones; Hyperlipidemia; Herpes simplex; Obesity affecting pregnancy, antepartum; Supervision of normal first pregnancy, antepartum; Morbid obesity with BMI of 40.0-44.9, adult (HCC); Anemia affecting pregnancy in third trimester; and Anxiety on their problem list.  Patient reports occasional contractions.  Contractions: Irregular. Vag. Bleeding: None.  Movement: Present. Denies leaking of fluid.   The following portions of the patient's history were reviewed and updated as appropriate: allergies, current medications, past family history, past medical history, past social history, past surgical history and problem list. Problem list updated.  Objective:   Vitals:   01/04/18 0819  BP: 126/87  Pulse: 72  Weight: 237 lb 6.4 oz (107.7 kg)    Fetal Status: Fetal Heart Rate (bpm): 128 Fundal Height: 40 cm Movement: Present  Presentation: Vertex  General:  Alert, oriented and cooperative. Patient is in no acute distress.  Skin: Skin is warm and dry. No rash noted.   Cardiovascular: Normal heart rate noted  Respiratory: Normal respiratory effort, no problems with respiration noted  Abdomen: Soft, gravid, appropriate for gestational age.  Pain/Pressure: Present     Pelvic: Cervical exam performed Dilation: Closed Effacement (%): Thick Station: Ballotable  Extremities: Normal range of motion.  Edema: Moderate pitting, indentation subsides rapidly  Mental Status: Normal mood and affect. Normal behavior. Normal judgment and thought content.   Assessment and Plan:  Pregnancy: G1P0 at [redacted]w[redacted]d  1. Supervision of normal first pregnancy, antepartum Watch fundal height, consider ultrasound for EFW/AFV if still elevated next week!  Plans to get influenza vaccination at work. Preterm labor symptoms and general obstetric precautions including but not limited to vaginal bleeding, contractions, leaking of fluid and fetal movement were reviewed in detail with the patient. Please refer to After Visit Summary for other counseling recommendations.  Return in about 1 week (around 01/11/2018) for OB Visit.  Future Appointments  Date Time Provider Department Center  01/11/2018  8:15 AM [place] Bing, MD [facility]  01/18/2018  9:15 AM Reva Bores, MD [facility]    Jaynie Collins, MD

## 2018-01-04 ENCOUNTER — Ambulatory Visit (INDEPENDENT_AMBULATORY_CARE_PROVIDER_SITE_OTHER): Payer: Medicaid Other | Admitting: Obstetrics & Gynecology

## 2018-01-04 VITALS — BP 126/87 | HR 72 | Wt 237.4 lb

## 2018-01-04 DIAGNOSIS — Z3400 Encounter for supervision of normal first pregnancy, unspecified trimester: Secondary | ICD-10-CM

## 2018-01-04 DIAGNOSIS — Z3403 Encounter for supervision of normal first pregnancy, third trimester: Secondary | ICD-10-CM

## 2018-01-04 NOTE — Patient Instructions (Signed)
Return to clinic for any scheduled appointments or obstetric concerns, or go to [facility] for evaluation  

## 2018-01-11 ENCOUNTER — Ambulatory Visit (INDEPENDENT_AMBULATORY_CARE_PROVIDER_SITE_OTHER): Payer: Medicaid Other | Admitting: Obstetrics and Gynecology

## 2018-01-11 VITALS — BP 125/86 | HR 72 | Wt 236.6 lb

## 2018-01-11 DIAGNOSIS — N39498 Other specified urinary incontinence: Secondary | ICD-10-CM

## 2018-01-11 DIAGNOSIS — Z23 Encounter for immunization: Secondary | ICD-10-CM | POA: Diagnosis not present

## 2018-01-11 DIAGNOSIS — O99213 Obesity complicating pregnancy, third trimester: Secondary | ICD-10-CM

## 2018-01-11 DIAGNOSIS — O99013 Anemia complicating pregnancy, third trimester: Secondary | ICD-10-CM

## 2018-01-11 DIAGNOSIS — Z3400 Encounter for supervision of normal first pregnancy, unspecified trimester: Secondary | ICD-10-CM

## 2018-01-11 DIAGNOSIS — O99210 Obesity complicating pregnancy, unspecified trimester: Secondary | ICD-10-CM

## 2018-01-11 DIAGNOSIS — Z6841 Body Mass Index (BMI) 40.0 and over, adult: Secondary | ICD-10-CM

## 2018-01-11 LAB — POCT URINALYSIS DIP (MANUAL ENTRY)
BILIRUBIN UA: NEGATIVE
BILIRUBIN UA: NEGATIVE mg/dL
Blood, UA: NEGATIVE
GLUCOSE UA: NEGATIVE mg/dL
Leukocytes, UA: NEGATIVE
Nitrite, UA: NEGATIVE
PROTEIN UA: NEGATIVE mg/dL
SPEC GRAV UA: 1.015 (ref 1.010–1.025)
Urobilinogen, UA: 0.2 E.U./dL
pH, UA: 6.5 (ref 5.0–8.0)

## 2018-01-11 NOTE — Progress Notes (Signed)
Prenatal Visit Note Date: 01/11/2018 Clinic: [facility]  Subjective:  Yvonne Trujillo is a 32 y.o. G1P0 at [redacted]w[redacted]d being seen today for ongoing prenatal care.  She is currently monitored for the following issues for this low-risk pregnancy and has Migraine; Depression with anxiety; Hirsutism; Acne; History of kidney stones; Hyperlipidemia; Herpes simplex; Obesity affecting pregnancy, antepartum; Supervision of normal first pregnancy, antepartum; Morbid obesity with BMI of 40.0-44.9, adult (HCC); Anemia affecting pregnancy in third trimester; and Anxiety on their problem list.  Patient reports some urine leakage. no other LUTs s/s.Marland Kitchen   Contractions: Irregular.  .  Movement: Present. Denies leaking of fluid.   The following portions of the patient's history were reviewed and updated as appropriate: allergies, current medications, past family history, past medical history, past social history, past surgical history and problem list. Problem list updated.  Objective:   Vitals:   01/11/18 0815  BP: 125/86  Pulse: 72  Weight: 236 lb 9.6 oz (107.3 kg)    Fetal Status: Fetal Heart Rate (bpm): 139 Fundal Height: 38 cm Movement: Present     General:  Alert, oriented and cooperative. Patient is in no acute distress.  Skin: Skin is warm and dry. No rash noted.   Cardiovascular: Normal heart rate noted  Respiratory: Normal respiratory effort, no problems with respiration noted  Abdomen: Soft, gravid, appropriate for gestational age. Pain/Pressure: Present     Pelvic:  Cervical exam deferred        Extremities: Normal range of motion.  Edema: Moderate pitting, indentation subsides rapidly  Mental Status: Normal mood and affect. Normal behavior. Normal judgment and thought content.   Urinalysis:      Assessment and Plan:  Pregnancy: G1P0 at [redacted]w[redacted]d  1. Obesity affecting pregnancy, antepartum Weight stable  2. Morbid obesity with BMI of 40.0-44.9, adult (HCC)  3. Supervision of  normal first pregnancy, antepartum Routine care  4. Anemia affecting pregnancy in third trimester  5. Need for influenza vaccination - Flu Vaccine QUAD 36+ mos IM (Fluarix, Quad PF)  6. Other urinary incontinence - Culture, OB Urine  Preterm labor symptoms and general obstetric precautions including but not limited to vaginal bleeding, contractions, leaking of fluid and fetal movement were reviewed in detail with the patient. Please refer to After Visit Summary for other counseling recommendations.  Return in about 1 week (around 01/18/2018) for 7-10d rob.   Cromberg Bing, MD

## 2018-01-13 LAB — URINE CULTURE, OB REFLEX

## 2018-01-13 LAB — CULTURE, OB URINE

## 2018-01-15 ENCOUNTER — Other Ambulatory Visit: Payer: Self-pay

## 2018-01-15 ENCOUNTER — Inpatient Hospital Stay (HOSPITAL_COMMUNITY)
Admission: AD | Admit: 2018-01-15 | Discharge: 2018-01-18 | DRG: 806 | Disposition: A | Discharge status: Home / Self Care | Payer: Medicaid Other | Attending: Family Medicine | Admitting: Family Medicine

## 2018-01-15 ENCOUNTER — Encounter (HOSPITAL_COMMUNITY): Payer: Self-pay

## 2018-01-15 DIAGNOSIS — O99214 Obesity complicating childbirth: Secondary | ICD-10-CM | POA: Diagnosis present

## 2018-01-15 DIAGNOSIS — B001 Herpesviral vesicular dermatitis: Secondary | ICD-10-CM | POA: Diagnosis present

## 2018-01-15 DIAGNOSIS — O9852 Other viral diseases complicating childbirth: Secondary | ICD-10-CM | POA: Diagnosis present

## 2018-01-15 DIAGNOSIS — Z87891 Personal history of nicotine dependence: Secondary | ICD-10-CM | POA: Diagnosis not present

## 2018-01-15 DIAGNOSIS — O9902 Anemia complicating childbirth: Secondary | ICD-10-CM | POA: Diagnosis present

## 2018-01-15 DIAGNOSIS — D649 Anemia, unspecified: Secondary | ICD-10-CM | POA: Diagnosis present

## 2018-01-15 DIAGNOSIS — O4292 Full-term premature rupture of membranes, unspecified as to length of time between rupture and onset of labor: Secondary | ICD-10-CM | POA: Diagnosis present

## 2018-01-15 DIAGNOSIS — Z880 Allergy status to penicillin: Secondary | ICD-10-CM | POA: Diagnosis not present

## 2018-01-15 DIAGNOSIS — O134 Gestational [pregnancy-induced] hypertension without significant proteinuria, complicating childbirth: Secondary | ICD-10-CM | POA: Diagnosis present

## 2018-01-15 DIAGNOSIS — Z3400 Encounter for supervision of normal first pregnancy, unspecified trimester: Secondary | ICD-10-CM

## 2018-01-15 DIAGNOSIS — O99210 Obesity complicating pregnancy, unspecified trimester: Secondary | ICD-10-CM

## 2018-01-15 DIAGNOSIS — Z3A39 39 weeks gestation of pregnancy: Secondary | ICD-10-CM | POA: Diagnosis not present

## 2018-01-15 DIAGNOSIS — O133 Gestational [pregnancy-induced] hypertension without significant proteinuria, third trimester: Secondary | ICD-10-CM

## 2018-01-15 DIAGNOSIS — Z8759 Personal history of other complications of pregnancy, childbirth and the puerperium: Secondary | ICD-10-CM | POA: Diagnosis present

## 2018-01-15 LAB — CBC
HCT: 31.8 % — ABNORMAL LOW (ref 36.0–46.0)
HEMOGLOBIN: 10.4 g/dL — AB (ref 12.0–15.0)
MCH: 27.4 pg (ref 26.0–34.0)
MCHC: 32.7 g/dL (ref 30.0–36.0)
MCV: 83.7 fL (ref 78.0–100.0)
Platelets: 321 10*3/uL (ref 150–400)
RBC: 3.8 MIL/uL — AB (ref 3.87–5.11)
RDW: 13.5 % (ref 11.5–15.5)
WBC: 12.6 10*3/uL — ABNORMAL HIGH (ref 4.0–10.5)

## 2018-01-15 LAB — URINALYSIS, ROUTINE W REFLEX MICROSCOPIC
BILIRUBIN URINE: NEGATIVE
GLUCOSE, UA: NEGATIVE mg/dL
HGB URINE DIPSTICK: NEGATIVE
Ketones, ur: NEGATIVE mg/dL
Leukocytes, UA: NEGATIVE
Nitrite: NEGATIVE
Protein, ur: NEGATIVE mg/dL
SPECIFIC GRAVITY, URINE: 1.009 (ref 1.005–1.030)
pH: 7 (ref 5.0–8.0)

## 2018-01-15 LAB — PROTEIN / CREATININE RATIO, URINE
Creatinine, Urine: 61 mg/dL
Protein Creatinine Ratio: 0.1 mg/mg{Cre} (ref 0.00–0.15)
TOTAL PROTEIN, URINE: 6 mg/dL

## 2018-01-15 LAB — COMPREHENSIVE METABOLIC PANEL
ALBUMIN: 2.4 g/dL — AB (ref 3.5–5.0)
ALT: 10 U/L (ref 0–44)
AST: 13 U/L — AB (ref 15–41)
Alkaline Phosphatase: 175 U/L — ABNORMAL HIGH (ref 38–126)
Anion gap: 10 (ref 5–15)
BUN: 10 mg/dL (ref 6–20)
CHLORIDE: 106 mmol/L (ref 98–111)
CO2: 18 mmol/L — ABNORMAL LOW (ref 22–32)
CREATININE: 0.65 mg/dL (ref 0.44–1.00)
Calcium: 8.7 mg/dL — ABNORMAL LOW (ref 8.9–10.3)
GFR calc Af Amer: >60 mL/min (ref >60)
GFR calc non Af Amer: >60 mL/min (ref >60)
GLUCOSE: 79 mg/dL (ref 70–99)
Potassium: 4 mmol/L (ref 3.5–5.1)
SODIUM: 134 mmol/L — AB (ref 135–145)
Total Bilirubin: 0.5 mg/dL (ref 0.3–1.2)
Total Protein: 6.1 g/dL — ABNORMAL LOW (ref 6.5–8.1)

## 2018-01-15 LAB — CBC WITH DIFFERENTIAL/PLATELET
BASOS ABS: 0 10*3/uL (ref 0.0–0.1)
Basophils Relative: 0 %
EOS ABS: 0.1 10*3/uL (ref 0.0–0.7)
EOS PCT: 1 %
HCT: 31.8 % — ABNORMAL LOW (ref 36.0–46.0)
HEMOGLOBIN: 10.4 g/dL — AB (ref 12.0–15.0)
Lymphocytes Relative: 26 %
Lymphs Abs: 3.1 10*3/uL (ref 0.7–4.0)
MCH: 27 pg (ref 26.0–34.0)
MCHC: 32.7 g/dL (ref 30.0–36.0)
MCV: 82.6 fL (ref 78.0–100.0)
Monocytes Absolute: 0.3 10*3/uL (ref 0.1–1.0)
Monocytes Relative: 3 %
NEUTROS PCT: 70 %
Neutro Abs: 8.6 10*3/uL — ABNORMAL HIGH (ref 1.7–7.7)
PLATELETS: 326 10*3/uL (ref 150–400)
RBC: 3.85 MIL/uL — AB (ref 3.87–5.11)
RDW: 13.4 % (ref 11.5–15.5)
WBC: 12.2 10*3/uL — AB (ref 4.0–10.5)

## 2018-01-15 LAB — TYPE AND SCREEN
ABO/RH(D): A POS
ANTIBODY SCREEN: NEGATIVE

## 2018-01-15 MED ORDER — LACTATED RINGERS IV SOLN
INTRAVENOUS | Status: DC
  Administered 2018-01-15 – 2018-01-16 (×3): via INTRAVENOUS

## 2018-01-15 MED ORDER — ACETAMINOPHEN 325 MG PO TABS: 650.0000 mg | ORAL_TABLET | ORAL | Status: DC | PRN

## 2018-01-15 MED ORDER — MISOPROSTOL 50MCG HALF TABLET
50.0000 ug | ORAL_TABLET | Freq: Once | ORAL | Status: AC
  Administered 2018-01-15: 50 ug via ORAL
  Filled 2018-01-15: qty 1

## 2018-01-15 MED ORDER — OXYCODONE-ACETAMINOPHEN 5-325 MG PO TABS: 1.0000 | ORAL_TABLET | ORAL | Status: DC | PRN

## 2018-01-15 MED ORDER — ONDANSETRON HCL 4 MG/2ML IJ SOLN
4.0000 mg | Freq: Four times a day (QID) | INTRAMUSCULAR | Status: DC | PRN
  Administered 2018-01-16: 4 mg via INTRAVENOUS
  Filled 2018-01-15: qty 2

## 2018-01-15 MED ORDER — LIDOCAINE HCL (PF) 1 % IJ SOLN
30.0000 mL | INTRAMUSCULAR | Status: AC | PRN
  Administered 2018-01-16: 30 mL via SUBCUTANEOUS
  Filled 2018-01-15: qty 30

## 2018-01-15 MED ORDER — FENTANYL CITRATE (PF) 100 MCG/2ML IJ SOLN
100.0000 ug | INTRAMUSCULAR | Status: DC | PRN
  Administered 2018-01-16 (×4): 100 ug via INTRAVENOUS
  Filled 2018-01-15 (×4): qty 2

## 2018-01-15 MED ORDER — TERBUTALINE SULFATE 1 MG/ML IJ SOLN
0.2500 mg | Freq: Once | INTRAMUSCULAR | Status: DC | PRN
  Filled 2018-01-15: qty 1

## 2018-01-15 MED ORDER — SOD CITRATE-CITRIC ACID 500-334 MG/5ML PO SOLN: 30.0000 mL | ORAL | Status: DC | PRN

## 2018-01-15 MED ORDER — OXYTOCIN 40 UNITS IN LACTATED RINGERS INFUSION - SIMPLE MED
2.5000 [IU]/h | INTRAVENOUS | Status: DC
  Filled 2018-01-15: qty 1000

## 2018-01-15 MED ORDER — LACTATED RINGERS IV SOLN: 500.0000 mL | INTRAVENOUS | Status: DC | PRN

## 2018-01-15 MED ORDER — VALACYCLOVIR HCL 500 MG PO TABS
1000.0000 mg | ORAL_TABLET | Freq: Every day | ORAL | Status: DC
  Filled 2018-01-15 (×2): qty 2

## 2018-01-15 MED ORDER — OXYCODONE-ACETAMINOPHEN 5-325 MG PO TABS: 2.0000 | ORAL_TABLET | ORAL | Status: DC | PRN

## 2018-01-15 MED ORDER — OXYTOCIN BOLUS FROM INFUSION
500.0000 mL | Freq: Once | INTRAVENOUS | Status: AC
  Administered 2018-01-16: 500 mL/h via INTRAVENOUS

## 2018-01-15 NOTE — MAU Provider Note (Signed)
History     CSN: [id number]  Arrival date and time: 01/15/18 1724   First Provider Initiated Contact with Patient 01/15/18 1813      Chief Complaint  Patient presents with  . Rupture of Membranes  . Vaginal Bleeding  . Back Pain   HPI Ms. Yvonne Trujillo is a 32 y.o. G1P0 at 105w0d who presents to MAU today with complaint of vaginal bleeding noted around 1600 today with small dark clots. She called the triage RN and was told to come in. While getting ready to come in had a gush of fluid. She denies contractions, headache, RUQ pain, floaters or change in LE edema. She reports normal fetal movement.   OB History    Gravida  1   Para      Term      Preterm      AB      Living        SAB      TAB      Ectopic      Multiple      Live Births              Past Medical History:  Diagnosis Date  . Asthma   . Concussion   . History of depression   . Kidney infection   . Kidney stone   . Migraine   . Supervision of normal first pregnancy, antepartum 06/23/2017    Clinic  [facility] Prenatal Labs Dating  Blood type:    Genetic Screen 1 Screen:    AFP:     Quad:     NIPS: Antibody:  Anatomic [place]  Rubella:   GTT Early:               Third trimester:  RPR:    Flu vaccine  HBsAg:    TDaP vaccine                                               Rhogam: HIV:   Baby Food        Breast                                        GBS: (For PCN allergy, check sensitivities) Cont    Past Surgical History:  Procedure Laterality Date  . APPENDECTOMY    . EXTRACORPOREAL SHOCK WAVE LITHOTRIPSY Left 07/09/2016   Procedure: EXTRACORPOREAL SHOCK WAVE LITHOTRIPSY (ESWL);  Surgeon: Vanna Scotland, MD;  Location: [facility] ORS;  Service: Urology;  Laterality: Left;    Family History  Problem Relation Age of Onset  . Alcohol abuse Maternal Uncle   . Hyperlipidemia Mother   . Hypertension Mother   . Heart disease Mother   . Hyperlipidemia Father   . Hypertension Father   . Diabetes Cousin   . Bladder  Cancer Neg Hx   . Kidney cancer Neg Hx     Social History   Tobacco Use  . Smoking status: Former Smoker    Years: 6.00    Types: Cigarettes  . Smokeless tobacco: Never Used  Substance Use Topics  . Alcohol use: Not Currently    Alcohol/week: 0.0 standard drinks    Comment: none with preg  . Drug use: Not Currently    Types: Marijuana  Comment: none with preg    Allergies: No Known Allergies  Medications Prior to Admission  Medication Sig Dispense Refill Last Dose  . albuterol (PROVENTIL HFA;VENTOLIN HFA) 108 (90 Base) MCG/ACT inhaler Inhale 2 puffs into the lungs every 6 (six) hours as needed. 1 Inhaler 0 prn  . aspirin EC 81 MG tablet Take 1 tablet (81 mg total) by mouth daily. Take after 12 weeks for prevention of preeclampsia later in pregnancy 300 tablet 2 Past Week at Unknown time  . promethazine (PHENERGAN) 25 MG tablet Take 0.5 tablets (12.5 mg total) by mouth every 6 (six) hours as needed for nausea or vomiting (migraines). 30 tablet 2 Past Week at Unknown time  . cyclobenzaprine (FLEXERIL) 10 MG tablet Take 1 tablet (10 mg total) by mouth every 8 (eight) hours as needed for muscle spasms. (Patient not taking: Reported on 01/11/2018) 30 tablet 1 Unknown at Unknown time  . docusate sodium (COLACE) 100 MG capsule Take 1 capsule (100 mg total) by mouth 2 (two) times daily. (Patient not taking: Reported on 01/04/2018) 10 capsule 0 Unknown at Unknown time  . ferrous fumarate (HEMOCYTE - 106 MG FE) 325 (106 Fe) MG TABS tablet Take 1 tablet (106 mg of iron total) by mouth 2 (two) times daily. (Patient not taking: Reported on 01/11/2018) 30 each 0 Unknown at Unknown time  . Prenatal Multivit-Min-Fe-FA (PRENATAL VITAMINS) 0.8 MG tablet Take 1 tablet by mouth daily. 30 tablet 12 Unknown at Unknown time  . valACYclovir (VALTREX) 1000 MG tablet TAKE 2 TABLETS BY MOUTH 2 TIMES A DAY FOR 1 DAY (Patient not taking: Reported on 01/04/2018) 20 tablet 1 Unknown at Unknown time    Review of  Systems  Constitutional: Negative for fever.  Gastrointestinal: Negative for abdominal pain.  Genitourinary: Positive for vaginal bleeding and vaginal discharge.   Physical Exam   Blood pressure (!) 141/93, pulse 99, temperature 97.9 F (36.6 C), temperature source Oral, resp. rate 20, height 5\' 1"  (1.549 m), weight 106.6 kg, last menstrual period 04/17/2017, SpO2 98 %.  Physical Exam  Nursing note and vitals reviewed. Constitutional: She is oriented to person, place, and time. She appears well-developed and well-nourished. No distress.  HENT:  Head: Normocephalic and atraumatic.  Cardiovascular: Normal rate.  Respiratory: Effort normal.  GI: Soft. She exhibits no distension and no mass. There is no tenderness. There is no rebound and no guarding.  Genitourinary: Uterus is enlarged. No bleeding in the vagina. Vaginal discharge ( + pooling, clear fluid) found.  Neurological: She is alert and oriented to person, place, and time.  Skin: Skin is warm and dry. No erythema.  Psychiatric: She has a normal mood and affect.    Cervical Exam:  Dilation: 1 Effacement (%): Thick Cervical Position: Posterior Presentation: Undeterminable Exam by:: Marvel PlanJessica Hashem RN   Results for orders placed or performed during the hospital encounter of 01/15/18 (from the past 24 hour(s))  Urinalysis, Routine w reflex microscopic     Status: Abnormal   Collection Time: 01/15/18  5:49 PM  Result Value Ref Range   Color, Urine STRAW (A) YELLOW   APPearance CLEAR CLEAR   Specific Gravity, Urine 1.009 1.005 - 1.030   pH 7.0 5.0 - 8.0   Glucose, UA NEGATIVE NEGATIVE mg/dL   Hgb urine dipstick NEGATIVE NEGATIVE   Bilirubin Urine NEGATIVE NEGATIVE   Ketones, ur NEGATIVE NEGATIVE mg/dL   Protein, ur NEGATIVE NEGATIVE mg/dL   Nitrite NEGATIVE NEGATIVE   Leukocytes, UA NEGATIVE NEGATIVE  Protein /  creatinine ratio, urine     Status: None   Collection Time: 01/15/18  5:49 PM  Result Value Ref Range    Creatinine, Urine 61.00 mg/dL   Total Protein, Urine 6 mg/dL   Protein Creatinine Ratio 0.10 0.00 - 0.15 mg/mg[Cre]  CBC with Differential/Platelet     Status: Abnormal   Collection Time: 01/15/18  6:32 PM  Result Value Ref Range   WBC 12.2 (H) 4.0 - 10.5 K/uL   RBC 3.85 (L) 3.87 - 5.11 MIL/uL   Hemoglobin 10.4 (L) 12.0 - 15.0 g/dL   HCT 16.1 (L) 09.6 - 04.5 %   MCV 82.6 78.0 - 100.0 fL   MCH 27.0 26.0 - 34.0 pg   MCHC 32.7 30.0 - 36.0 g/dL   RDW 40.9 81.1 - 91.4 %   Platelets 326 150 - 400 K/uL   Neutrophils Relative % 70 %   Neutro Abs 8.6 (H) 1.7 - 7.7 K/uL   Lymphocytes Relative 26 %   Lymphs Abs 3.1 0.7 - 4.0 K/uL   Monocytes Relative 3 %   Monocytes Absolute 0.3 0.1 - 1.0 K/uL   Eosinophils Relative 1 %   Eosinophils Absolute 0.1 0.0 - 0.7 K/uL   Basophils Relative 0 %   Basophils Absolute 0.0 0.0 - 0.1 K/uL  Comprehensive metabolic panel     Status: Abnormal   Collection Time: 01/15/18  6:32 PM  Result Value Ref Range   Sodium 134 (L) 135 - 145 mmol/L   Potassium 4.0 3.5 - 5.1 mmol/L   Chloride 106 98 - 111 mmol/L   CO2 18 (L) 22 - 32 mmol/L   Glucose, Bld 79 70 - 99 mg/dL   BUN 10 6 - 20 mg/dL   Creatinine, Ser 7.82 0.44 - 1.00 mg/dL   Calcium 8.7 (L) 8.9 - 10.3 mg/dL   Total Protein 6.1 (L) 6.5 - 8.1 g/dL   Albumin 2.4 (L) 3.5 - 5.0 g/dL   AST 13 (L) 15 - 41 U/L   ALT 10 0 - 44 U/L   Alkaline Phosphatase 175 (H) 38 - 126 U/L   Total Bilirubin 0.5 0.3 - 1.2 mg/dL   GFR calc non Af Amer >60 >60 mL/min   GFR calc Af Amer >60 >60 mL/min   Anion gap 10 5 - 15     MAU Course  Procedures Pt informed that the ultrasound is considered a limited OB ultrasound and is not intended to be a complete ultrasound exam.  Patient also informed that the ultrasound is not being completed with the intent of assessing for fetal or placental anomalies or any pelvic abnormalities.  Explained that the purpose of today's ultrasound is to assess for  presentation.  Patient acknowledges  the purpose of the exam and the limitations of the study.   Cephalic presentation confirmed  MDM Serial BPs UA, CBC, CMP and Urine protein/creatinine ratio today  Discussed patient with Dr. Emelda Fear. Admit for IOL for GHTN Assessment and Plan  A: SIUP at [redacted]w[redacted]d GHTN SROM  P: Admit to L&D   Vonzella Nipple, PA-C 01/15/2018, 7:17 PM

## 2018-01-15 NOTE — MAU Note (Signed)
Pt. States vag bleeding began around 4pm today. Pt states that her water broke in the car on way to hospital at 4:30pm.  Pt reports good fetal movement.  Pt has some lower back pain.

## 2018-01-15 NOTE — Progress Notes (Signed)
POC discussed with pt. Pt verbalizes understanding and will notify RN of discomfort, pain,and bleeding.   

## 2018-01-15 NOTE — H&P (Signed)
OBSTETRIC ADMISSION HISTORY AND PHYSICAL  Yvonne Trujillo is a 32 y.o. female G1P0 with IUP at [redacted]w[redacted]d by LMP presenting for SROM and VB. She noted vaginal bleeding and passed several clots at 1500 today, was advised by her OB to come to the MAU. En route, her water broke around 1630, reports clear fluid. She is not sure how often contractions are occurring, but has been having irregular back and crampy abdominal pain for several days. Not currently feeling any contractions.  +FMs, no blurry vision, headaches or peripheral edema, or RUQ pain.  She plans on breast feeding. She request POP for birth control.  In MAU, noted to have elevated BPs 140s/100s.   GBS-negative.  She received her prenatal care at [facility]   Dating: By LMP on 04/17/2018 --->  Estimated Date of Delivery: 01/22/18  Sono:    @[redacted]w[redacted]d , CWD, normal anatomy, cephalic presentation,  Placenta right lateral, above cervical os, 641g, 61% EFW   Prenatal History/Complications: - Herpes simplex - Anemia of third trimester - Obesity affecting pregnancy -HSV on Valtrex; reportedly only has had oral outbreaks, no prodromal symptoms or active lesions at time of admit    Past Medical History: Past Medical History:  Diagnosis Date  . Asthma   . Concussion   . History of depression   . Kidney infection   . Kidney stone   . Migraine   . Supervision of normal first pregnancy, antepartum 06/23/2017    Clinic  [facility] Prenatal Labs Dating  Blood type:    Genetic Screen 1 Screen:    AFP:     Quad:     NIPS: Antibody:  Anatomic [place]  Rubella:   GTT Early:               Third trimester:  RPR:    Flu vaccine  HBsAg:    TDaP vaccine                                               Rhogam: HIV:   Baby Food        Breast                                        GBS: (For PCN allergy, check sensitivities) Cont    Past Surgical History: Past Surgical History:  Procedure Laterality Date  . APPENDECTOMY    . EXTRACORPOREAL SHOCK WAVE LITHOTRIPSY Left 07/09/2016    Procedure: EXTRACORPOREAL SHOCK WAVE LITHOTRIPSY (ESWL);  Surgeon: Vanna Scotland, MD;  Location: [facility] ORS;  Service: Urology;  Laterality: Left;    Obstetrical History: OB History    Gravida  1   Para      Term      Preterm      AB      Living        SAB      TAB      Ectopic      Multiple      Live Births              Social History: Social History   Socioeconomic History  . Marital status: Single    Spouse name: Not on file  . Number of children: Not on file  . Years of education: Not on file  .  Highest education level: Not on file  Occupational History  . Not on file  Social Needs  . Financial resource strain: Not on file  . Food insecurity:    Worry: Not on file    Inability: Not on file  . Transportation needs:    Medical: Not on file    Non-medical: Not on file  Tobacco Use  . Smoking status: Former Smoker    Years: 6.00    Types: Cigarettes  . Smokeless tobacco: Never Used  Substance and Sexual Activity  . Alcohol use: Not Currently    Alcohol/week: 0.0 standard drinks    Comment: none with preg  . Drug use: Not Currently    Types: Marijuana    Comment: none with preg  . Sexual activity: Yes    Birth control/protection: None  Lifestyle  . Physical activity:    Days per week: Not on file    Minutes per session: Not on file  . Stress: Not on file  Relationships  . Social connections:    Talks on phone: Not on file    Gets together: Not on file    Attends religious service: Not on file    Active member of club or organization: Not on file    Attends meetings of clubs or organizations: Not on file    Relationship status: Not on file  Other Topics Concern  . Not on file  Social History Narrative  . Not on file    Family History: Family History  Problem Relation Age of Onset  . Alcohol abuse Maternal Uncle   . Hyperlipidemia Mother   . Hypertension Mother   . Heart disease Mother   . Hyperlipidemia Father   . Hypertension  Father   . Diabetes Cousin   . Bladder Cancer Neg Hx   . Kidney cancer Neg Hx     Allergies: No Known Allergies  Medications Prior to Admission  Medication Sig Dispense Refill Last Dose  . albuterol (PROVENTIL HFA;VENTOLIN HFA) 108 (90 Base) MCG/ACT inhaler Inhale 2 puffs into the lungs every 6 (six) hours as needed. 1 Inhaler 0 prn  . aspirin EC 81 MG tablet Take 1 tablet (81 mg total) by mouth daily. Take after 12 weeks for prevention of preeclampsia later in pregnancy 300 tablet 2 Past Week at Unknown time  . promethazine (PHENERGAN) 25 MG tablet Take 0.5 tablets (12.5 mg total) by mouth every 6 (six) hours as needed for nausea or vomiting (migraines). 30 tablet 2 Past Week at Unknown time  . cyclobenzaprine (FLEXERIL) 10 MG tablet Take 1 tablet (10 mg total) by mouth every 8 (eight) hours as needed for muscle spasms. (Patient not taking: Reported on 01/11/2018) 30 tablet 1 Unknown at Unknown time  . docusate sodium (COLACE) 100 MG capsule Take 1 capsule (100 mg total) by mouth 2 (two) times daily. (Patient not taking: Reported on 01/04/2018) 10 capsule 0 Unknown at Unknown time  . ferrous fumarate (HEMOCYTE - 106 MG FE) 325 (106 Fe) MG TABS tablet Take 1 tablet (106 mg of iron total) by mouth 2 (two) times daily. (Patient not taking: Reported on 01/11/2018) 30 each 0 Unknown at Unknown time  . Prenatal Multivit-Min-Fe-FA (PRENATAL VITAMINS) 0.8 MG tablet Take 1 tablet by mouth daily. 30 tablet 12 Unknown at Unknown time  . valACYclovir (VALTREX) 1000 MG tablet TAKE 2 TABLETS BY MOUTH 2 TIMES A DAY FOR 1 DAY (Patient not taking: Reported on 01/04/2018) 20 tablet 1 Unknown at Unknown time  Review of Systems   All systems reviewed and negative except as stated in HPI  Blood pressure (!) 141/93, pulse 99, temperature 97.9 F (36.6 C), temperature source Oral, resp. rate 20, height 5\' 1"  (1.549 m), weight 106.6 kg, last menstrual period 04/17/2017, SpO2 98 %. General appearance: alert,  cooperative and no distress Lungs: clear to auscultation bilaterally Heart: regular rate and rhythm Abdomen: soft, non-tender; bowel sounds normal Pelvic: see below Extremities: Homans sign is negative, no sign of DVT Presentation: cephalic Fetal monitoringBaseline: 125 bpm, Variability: Good {> 6 bpm), Accelerations: Reactive and Decelerations: Absent Uterine activity: irregular, q5-10 min. Dilation: 1 Effacement (%): Thick Exam by:: Marvel Plan RN    Prenatal labs: ABO, Rh: A/Positive/-- (02/20 1045) Antibody: Negative (02/20 1045) Rubella: 1.11 (02/20 1045) RPR: Non Reactive (06/27 0825)  HBsAg: Negative (02/20 1045)  HIV: Non Reactive (06/27 0825)  GBS: Negative (08/27 0910)  GTT: fasting 83/1h 148/2h 121 Genetic screening  Normal Anatomy US Normal; 19 wk sono with limited views of heart due to maternal body habitus and fetal position, repeat at 23 wk normal   Prenatal Transfer Tool  Maternal Diabetes: No Genetic Screening: Normal Maternal Ultrasounds/Referrals: Normal Fetal Ultrasounds or other Referrals:  None Maternal Substance Abuse:  No - hx of smoking Significant Maternal Medications:  Meds include: Other:  valtrex  Significant Maternal Lab Results: Lab values include: Group B Strep negative  Results for orders placed or performed during the hospital encounter of 01/15/18 (from the past 24 hour(s))  Urinalysis, Routine w reflex microscopic   Collection Time: 01/15/18  5:49 PM  Result Value Ref Range   Color, Urine STRAW (A) YELLOW   APPearance CLEAR CLEAR   Specific Gravity, Urine 1.009 1.005 - 1.030   pH 7.0 5.0 - 8.0   Glucose, UA NEGATIVE NEGATIVE mg/dL   Hgb urine dipstick NEGATIVE NEGATIVE   Bilirubin Urine NEGATIVE NEGATIVE   Ketones, ur NEGATIVE NEGATIVE mg/dL   Protein, ur NEGATIVE NEGATIVE mg/dL   Nitrite NEGATIVE NEGATIVE   Leukocytes, UA NEGATIVE NEGATIVE  Protein / creatinine ratio, urine   Collection Time: 01/15/18  5:49 PM  Result Value  Ref Range   Creatinine, Urine 61.00 mg/dL   Total Protein, Urine 6 mg/dL   Protein Creatinine Ratio 0.10 0.00 - 0.15 mg/mg[Cre]  CBC with Differential/Platelet   Collection Time: 01/15/18  6:32 PM  Result Value Ref Range   WBC 12.2 (H) 4.0 - 10.5 K/uL   RBC 3.85 (L) 3.87 - 5.11 MIL/uL   Hemoglobin 10.4 (L) 12.0 - 15.0 g/dL   HCT 16.1 (L) 09.6 - 04.5 %   MCV 82.6 78.0 - 100.0 fL   MCH 27.0 26.0 - 34.0 pg   MCHC 32.7 30.0 - 36.0 g/dL   RDW 40.9 81.1 - 91.4 %   Platelets 326 150 - 400 K/uL   Neutrophils Relative % 70 %   Neutro Abs 8.6 (H) 1.7 - 7.7 K/uL   Lymphocytes Relative 26 %   Lymphs Abs 3.1 0.7 - 4.0 K/uL   Monocytes Relative 3 %   Monocytes Absolute 0.3 0.1 - 1.0 K/uL   Eosinophils Relative 1 %   Eosinophils Absolute 0.1 0.0 - 0.7 K/uL   Basophils Relative 0 %   Basophils Absolute 0.0 0.0 - 0.1 K/uL  Comprehensive metabolic panel   Collection Time: 01/15/18  6:32 PM  Result Value Ref Range   Sodium 134 (L) 135 - 145 mmol/L   Potassium 4.0 3.5 - 5.1 mmol/L   Chloride  106 98 - 111 mmol/L   CO2 18 (L) 22 - 32 mmol/L   Glucose, Bld 79 70 - 99 mg/dL   BUN 10 6 - 20 mg/dL   Creatinine, Ser 1.610.65 0.44 - 1.00 mg/dL   Calcium 8.7 (L) 8.9 - 10.3 mg/dL   Total Protein 6.1 (L) 6.5 - 8.1 g/dL   Albumin 2.4 (L) 3.5 - 5.0 g/dL   AST 13 (L) 15 - 41 U/L   ALT 10 0 - 44 U/L   Alkaline Phosphatase 175 (H) 38 - 126 U/L   Total Bilirubin 0.5 0.3 - 1.2 mg/dL   GFR calc non Af Amer >60 >60 mL/min   GFR calc Af Amer >60 >60 mL/min   Anion gap 10 5 - 15    Patient Active Problem List   Diagnosis Date Noted  . Anxiety 11/25/2017  . Anemia affecting pregnancy in third trimester 11/10/2017  . Obesity affecting pregnancy, antepartum 06/23/2017  . Supervision of normal first pregnancy, antepartum 06/23/2017  . Morbid obesity with BMI of 40.0-44.9, adult (HCC) 06/23/2017  . Herpes simplex 01/18/2015  . Hyperlipidemia 04/25/2013  . Migraine 04/24/2013  . Depression with anxiety  04/24/2013  . Hirsutism 04/24/2013  . Acne 04/24/2013  . History of kidney stones 04/24/2013    Assessment/Plan:  Yvonne Trujillo is a 32 y.o. G1P0 at 3261w0d here for augmentation of labor for PROM.  #Labor: Discussed options to repeat cervical exam as patient does exhibit some irregular ctx on toco vs. Starting augmentation. Patient elects to start augmenting labor. Will start with Cytotec 50 mg PO.  #Pain: Desires epidural.  #FWB: Cat I  #ID:  [id number]. #MOF: Breastfeeding #MOC: POP #Circ: Would like circumcision here  Marcy Sirenatherine Macgregor Aeschliman, D.O. [facility] Fellow, [facility], [facility] 01/15/2018, 7:42 PM

## 2018-01-16 ENCOUNTER — Inpatient Hospital Stay (HOSPITAL_COMMUNITY): Payer: Medicaid Other | Admitting: Anesthesiology

## 2018-01-16 ENCOUNTER — Encounter (HOSPITAL_COMMUNITY): Payer: Self-pay | Admitting: *Deleted

## 2018-01-16 DIAGNOSIS — Z3A39 39 weeks gestation of pregnancy: Secondary | ICD-10-CM

## 2018-01-16 LAB — CBC
HEMATOCRIT: 30.8 % — AB (ref 36.0–46.0)
HEMOGLOBIN: 10.1 g/dL — AB (ref 12.0–15.0)
MCH: 27.1 pg (ref 26.0–34.0)
MCHC: 32.8 g/dL (ref 30.0–36.0)
MCV: 82.6 fL (ref 78.0–100.0)
Platelets: 330 10*3/uL (ref 150–400)
RBC: 3.73 MIL/uL — ABNORMAL LOW (ref 3.87–5.11)
RDW: 13.6 % (ref 11.5–15.5)
WBC: 15.8 10*3/uL — ABNORMAL HIGH (ref 4.0–10.5)

## 2018-01-16 LAB — RPR: RPR: NONREACTIVE

## 2018-01-16 LAB — ABO/RH: ABO/RH(D): A POS

## 2018-01-16 MED ORDER — MISOPROSTOL 50MCG HALF TABLET
50.0000 ug | ORAL_TABLET | Freq: Once | ORAL | Status: DC
  Filled 2018-01-16: qty 1

## 2018-01-16 MED ORDER — SIMETHICONE 80 MG PO CHEW: 80.0000 mg | CHEWABLE_TABLET | ORAL | Status: DC | PRN

## 2018-01-16 MED ORDER — FENTANYL 2.5 MCG/ML BUPIVACAINE 1/10 % EPIDURAL INFUSION (WH - ANES)
14.0000 mL/h | INTRAMUSCULAR | Status: DC | PRN
  Administered 2018-01-16: 14 mL/h via EPIDURAL
  Filled 2018-01-16: qty 100

## 2018-01-16 MED ORDER — BENZOCAINE-MENTHOL 20-0.5 % EX AERO
1.0000 "application " | INHALATION_SPRAY | CUTANEOUS | Status: DC | PRN
  Administered 2018-01-16 – 2018-01-18 (×2): 1 via TOPICAL
  Filled 2018-01-16 (×2): qty 56

## 2018-01-16 MED ORDER — WITCH HAZEL-GLYCERIN EX PADS: 1.0000 "application " | MEDICATED_PAD | CUTANEOUS | Status: DC | PRN

## 2018-01-16 MED ORDER — ZOLPIDEM TARTRATE 5 MG PO TABS: 5.0000 mg | ORAL_TABLET | Freq: Every evening | ORAL | Status: DC | PRN

## 2018-01-16 MED ORDER — DIBUCAINE 1 % RE OINT
1.0000 "application " | TOPICAL_OINTMENT | RECTAL | Status: DC | PRN
  Administered 2018-01-17: 1 via RECTAL
  Filled 2018-01-16: qty 28

## 2018-01-16 MED ORDER — IBUPROFEN 600 MG PO TABS
600.0000 mg | ORAL_TABLET | Freq: Four times a day (QID) | ORAL | Status: DC
  Administered 2018-01-16 – 2018-01-18 (×8): 600 mg via ORAL
  Filled 2018-01-16 (×10): qty 1

## 2018-01-16 MED ORDER — PHENYLEPHRINE 40 MCG/ML (10ML) SYRINGE FOR IV PUSH (FOR BLOOD PRESSURE SUPPORT)
80.0000 ug | PREFILLED_SYRINGE | INTRAVENOUS | Status: DC | PRN
  Filled 2018-01-16: qty 5
  Filled 2018-01-16: qty 10

## 2018-01-16 MED ORDER — PHENYLEPHRINE 40 MCG/ML (10ML) SYRINGE FOR IV PUSH (FOR BLOOD PRESSURE SUPPORT)
80.0000 ug | PREFILLED_SYRINGE | INTRAVENOUS | Status: DC | PRN
  Filled 2018-01-16: qty 5

## 2018-01-16 MED ORDER — TETANUS-DIPHTH-ACELL PERTUSSIS 5-2.5-18.5 LF-MCG/0.5 IM SUSP: 0.5000 mL | Freq: Once | INTRAMUSCULAR | Status: DC

## 2018-01-16 MED ORDER — COCONUT OIL OIL: 1.0000 "application " | TOPICAL_OIL | Status: DC | PRN

## 2018-01-16 MED ORDER — OXYCODONE HCL 5 MG PO TABS
5.0000 mg | ORAL_TABLET | Freq: Four times a day (QID) | ORAL | Status: DC | PRN
  Administered 2018-01-16 – 2018-01-18 (×3): 5 mg via ORAL
  Filled 2018-01-16 (×4): qty 1

## 2018-01-16 MED ORDER — EPHEDRINE 5 MG/ML INJ
10.0000 mg | INTRAVENOUS | Status: DC | PRN
  Filled 2018-01-16: qty 2

## 2018-01-16 MED ORDER — LACTATED RINGERS IV SOLN
500.0000 mL | Freq: Once | INTRAVENOUS | Status: AC
  Administered 2018-01-16: 500 mL via INTRAVENOUS

## 2018-01-16 MED ORDER — SODIUM BICARBONATE 8.4 % IV SOLN
INTRAVENOUS | Status: DC | PRN
  Administered 2018-01-16: 3 mL via EPIDURAL
  Administered 2018-01-16 (×2): 4 mL via EPIDURAL

## 2018-01-16 MED ORDER — SENNOSIDES-DOCUSATE SODIUM 8.6-50 MG PO TABS
2.0000 | ORAL_TABLET | ORAL | Status: DC
  Administered 2018-01-16 – 2018-01-17 (×2): 2 via ORAL
  Filled 2018-01-16 (×2): qty 2

## 2018-01-16 MED ORDER — DIPHENHYDRAMINE HCL 50 MG/ML IJ SOLN: 12.5000 mg | INTRAMUSCULAR | Status: DC | PRN

## 2018-01-16 MED ORDER — ONDANSETRON HCL 4 MG PO TABS: 4.0000 mg | ORAL_TABLET | ORAL | Status: DC | PRN

## 2018-01-16 MED ORDER — PRENATAL MULTIVITAMIN CH
1.0000 | ORAL_TABLET | Freq: Every day | ORAL | Status: DC
  Administered 2018-01-17 – 2018-01-18 (×2): 1 via ORAL
  Filled 2018-01-16 (×2): qty 1

## 2018-01-16 MED ORDER — ACETAMINOPHEN 325 MG PO TABS
650.0000 mg | ORAL_TABLET | ORAL | Status: DC | PRN
  Administered 2018-01-16 – 2018-01-17 (×5): 650 mg via ORAL
  Filled 2018-01-16 (×5): qty 2

## 2018-01-16 MED ORDER — ONDANSETRON HCL 4 MG/2ML IJ SOLN: 4.0000 mg | INTRAMUSCULAR | Status: DC | PRN

## 2018-01-16 MED ORDER — DIPHENHYDRAMINE HCL 25 MG PO CAPS: 25.0000 mg | ORAL_CAPSULE | Freq: Four times a day (QID) | ORAL | Status: DC | PRN

## 2018-01-16 NOTE — Anesthesia Preprocedure Evaluation (Signed)
Anesthesia Evaluation  Patient identified by MRN, date of birth, ID band Patient awake    Reviewed: Allergy & Precautions, NPO status , Patient's Chart, lab work & pertinent test results  Airway Mallampati: II  TM Distance: >3 FB Neck ROM: Full    Dental no notable dental hx. (+) Teeth Intact   Pulmonary asthma , former smoker,    Pulmonary exam normal breath sounds clear to auscultation       Cardiovascular hypertension (gestational), Normal cardiovascular exam Rhythm:Regular Rate:Normal     Neuro/Psych  Headaches, Anxiety    GI/Hepatic negative GI ROS, Neg liver ROS,   Endo/Other  negative endocrine ROS  Renal/GU negative Renal ROS  negative genitourinary   Musculoskeletal negative musculoskeletal ROS (+)   Abdominal   Peds  Hematology  (+) anemia ,   Anesthesia Other Findings   Reproductive/Obstetrics (+) Pregnancy                             Anesthesia Physical Anesthesia Plan  ASA: III  Anesthesia Plan: Epidural   Post-op Pain Management:    Induction:   PONV Risk Score and Plan: Treatment may vary due to age or medical condition  Airway Management Planned: Natural Airway  Additional Equipment:   Intra-op Plan:   Post-operative Plan:   Informed Consent: I have reviewed the patients History and Physical, chart, labs and discussed the procedure including the risks, benefits and alternatives for the proposed anesthesia with the patient or authorized representative who has indicated his/her understanding and acceptance.     Plan Discussed with: Anesthesiologist  Anesthesia Plan Comments: (Patient identified. Risks, benefits, options discussed with patient including but not limited to bleeding, infection, nerve damage, paralysis, failed block, incomplete pain control, headache, blood pressure changes, nausea, vomiting, reactions to medication, itching, and post partum back  pain. Confirmed with bedside nurse the patient's most recent platelet count. Confirmed with the patient that they are not taking any anticoagulation, have any bleeding history or any family history of bleeding disorders. Patient expressed understanding and wishes to proceed. All questions were answered. )        Anesthesia Quick Evaluation

## 2018-01-16 NOTE — Progress Notes (Signed)
Labor Progress Note Yvonne Trujillo is a 32 y.o. G1P0 at 6044w1d presented for SROM, VB S: doing well, having occasional mild contractions  O:  BP 126/83 (BP Location: Right Arm)   Pulse 97   Temp 98.1 F (36.7 C) (Oral)   Resp 16   Ht 5\' 1"  (1.549 m)   Wt 106.6 kg   LMP 04/17/2017   SpO2 98%   BMI 44.43 kg/m  EFM: 125/mod var/+accels  CVE: Dilation: 2 Effacement (%): 50 Cervical Position: Posterior Station: -3 Presentation: Vertex Exam by:: Bhambri, CNM   A&P: 32 y.o. G1P0 8544w1d w/ SROM, VB #Labor: mild irregular ctx vs irritability. Place fb, start cytotec.  #Pain: fentanyl, epidural #FWB: Cat1 FHT #GBS negative   Denzil HughesKelsey D Nassir Neidert, MD 12:44 AM

## 2018-01-16 NOTE — Anesthesia Procedure Notes (Signed)
Epidural Patient location during procedure: OB Start time: 01/16/2018 6:15 AM End time: 01/16/2018 6:30 AM  Staffing Anesthesiologist: Elmer PickerWoodrum, Theodor Mustin L, MD Performed: anesthesiologist   Preanesthetic Checklist Completed: patient identified, pre-op evaluation, timeout performed, IV checked, risks and benefits discussed and monitors and equipment checked  Epidural Patient position: sitting Prep: site prepped and draped and DuraPrep Patient monitoring: continuous pulse ox, blood pressure, heart rate and cardiac monitor Approach: midline Location: L3-L4 Injection technique: LOR air  Needle:  Needle type: Tuohy  Needle gauge: 17 G Needle length: 9 cm Needle insertion depth: 7 cm Catheter type: closed end flexible Catheter size: 19 Gauge Catheter at skin depth: 12 cm Test dose: negative  Assessment Sensory level: T8 Events: blood not aspirated, injection not painful, no injection resistance, negative IV test and no paresthesia  Additional Notes Patient identified. Risks/Benefits/Options discussed with patient including but not limited to bleeding, infection, nerve damage, paralysis, failed block, incomplete pain control, headache, blood pressure changes, nausea, vomiting, reactions to medication both or allergic, itching and postpartum back pain. Confirmed with bedside nurse the patient's most recent platelet count. Confirmed with patient that they are not currently taking any anticoagulation, have any bleeding history or any family history of bleeding disorders. Patient expressed understanding and wished to proceed. All questions were answered. Sterile technique was used throughout the entire procedure. Please see nursing notes for vital signs. Test dose was given through epidural catheter and negative prior to continuing to dose epidural or start infusion. Warning signs of high block given to the patient including shortness of breath, tingling/numbness in hands, complete motor block,  or any concerning symptoms with instructions to call for help. Patient was given instructions on fall risk and not to get out of bed. All questions and concerns addressed with instructions to call with any issues or inadequate analgesia.  Reason for block:procedure for pain

## 2018-01-16 NOTE — Anesthesia Pain Management Evaluation Note (Signed)
  CRNA Pain Management Visit Note  Patient: Yvonne Trujillo, 32 y.o., female  "Hello I am a member of the anesthesia team at [facility]. We have an anesthesia team available at all times to provide care throughout the hospital, including epidural management and anesthesia for C-section. I don't know your plan for the delivery whether it a natural birth, water birth, IV sedation, nitrous supplementation, doula or epidural, but we want to meet your pain goals."   1.Was your pain managed to your expectations on prior hospitalizations?   No prior hospitalizations  2.What is your expectation for pain management during this hospitalization?     Epidural  3.How can we help you reach that goal? unsure  Record the patient's initial score and the patient's pain goal.   Pain: 0  Pain Goal: 10 [facility] wants you to be able to say your pain was always managed very well.  Cephus ShellingBURGER,Enid Maultsby 01/16/2018

## 2018-01-16 NOTE — Anesthesia Postprocedure Evaluation (Signed)
Anesthesia Post Note  Patient: Yvonne Trujillo  Procedure(s) Performed: AN AD HOC LABOR EPIDURAL     Patient location during evaluation: Mother Baby Anesthesia Type: Epidural Level of consciousness: awake Pain management: satisfactory to patient Vital Signs Assessment: post-procedure vital signs reviewed and stable Respiratory status: spontaneous breathing Cardiovascular status: stable Anesthetic complications: no    Last Vitals:  Vitals:   01/16/18 1030 01/16/18 1145  BP: 139/89 133/82  Pulse: 97 87  Resp: 18 18  Temp: 36.8 C 37.1 C  SpO2: 99% 99%    Last Pain:  Vitals:   01/16/18 1145  TempSrc: Oral  PainSc: 4    Pain Goal: Patients Stated Pain Goal: 3 (01/16/18 1145)               Cephus ShellingBURGER,Angelgabriel Willmore

## 2018-01-16 NOTE — Lactation Note (Signed)
This note was copied from a baby's chart. Lactation Consultation Note  Patient Name: Yvonne Trujillo ZOXWR'UToday's Date: 01/16/2018 Reason for consult: Initial assessment;Term;1st time breastfeeding;Primapara  P1 mother whose infant is now 3210 hours old  Baby awake after his bath and mother wanted latch assistance  Mother's breasts are large, soft and non tender.  Her nipples are flat bilaterally and invert slightly with compression.  Hand expression taught and a drop of colostrum noted which was finger fed back to baby.  Attempted to latch baby onto the left breast in the football hold.  Baby did open his mouth wide and latched but only sucked 2 times before releasing.  This was repeated a few times with the same result.  Explained to mother that we will try some tools that will help evert her nipples to make latching easier.    Introduced the breast shells and manual pump with instructions for use.  Assisted mother to put on bra to begin using shells immediately.  Flange size for manual pump changed from a #24 to a #27 for better fit.  Also offered to initiate the DEBP for increased milk supply and to help evert nipples and mother willing to pump.  Pump parts, assembly, disassembly and cleaning reviewed.  Milk storage times discussed and mother will finger feed/spoon feed back any EBM she obtains from hand expression and pumping.  Knowing that I presented a lot of information I encouraged her to continue asking for assistance as needed.  Parents are willing to do what is necessary to help baby latch and feed well.    Mom made aware of O/P services, breastfeeding support groups, community resources, and our phone # for post-discharge questions. RN updated.   Maternal Data Formula Feeding for Exclusion: No Has patient been taught Hand Expression?: Yes Does the patient have breastfeeding experience prior to this delivery?: No  Feeding Feeding Type: Breast Fed Length of feed: 0 min  LATCH  Score Latch: Repeated attempts needed to sustain latch, nipple held in mouth throughout feeding, stimulation needed to elicit sucking reflex.  Audible Swallowing: None  Type of Nipple: Flat  Comfort (Breast/Nipple): Soft / non-tender  Hold (Positioning): Assistance needed to correctly position infant at breast and maintain latch.  LATCH Score: 5  Interventions Interventions: Breast feeding basics reviewed;Assisted with latch;Skin to skin;Breast massage;Hand express;Pre-pump if needed;Position options;Support pillows;Adjust position;Breast compression;Shells;Hand pump;DEBP  Lactation Tools Discussed/Used Tools: Shells;Pump;Flanges Flange Size: 27 Shell Type: Inverted Breast pump type: Double-Electric Breast Pump;Manual WIC Program: No Pump Review: Setup, frequency, and cleaning;Milk Storage Initiated by:: Mykalah Saari Date initiated:: 01/17/18   Consult Status Consult Status: Follow-up Date: 01/17/18 Follow-up type: In-patient    Damiana Berrian R Jashay Roddy 01/16/2018, 7:17 PM

## 2018-01-16 NOTE — Progress Notes (Signed)
Labor Progress Note Yvonne Trujillo is a 32 y.o. G1P0 at 7157w1d presented for SROM, VB S: comfortable w/ fentanyl. contractions still mild.  O:  BP 126/83 (BP Location: Right Arm)   Pulse 97   Temp 98 F (36.7 C) (Oral)   Resp 16   Ht 5\' 1"  (1.549 m)   Wt 106.6 kg   LMP 04/17/2017   SpO2 98%   BMI 44.43 kg/m  EFM: 125/mod var/+accels. CTX q26min  CVE: Dilation: 2 Effacement (%): 50 Cervical Position: Posterior Station: -3 Presentation: Vertex Exam by:: Bhambri, CNM   A&P: 32 y.o. G1P0 3957w1d SROM, VB #Labor: Progressing well. FB still in place. S/p cytotec x1, will redose #Pain: fentanyl, epidural after pit. #FWB: Cat 1 FHT #GBS negative   Yvonne HughesKelsey D Zyona Pettaway, MD 2:22 AM

## 2018-01-17 NOTE — Progress Notes (Signed)
MOB was referred for history of depression/anxiety. * Referral screened out by Clinical Social Worker because none of the following criteria appear to apply: ~ History of anxiety/depression during this pregnancy, or of post-partum depression following prior delivery. ~ Diagnosis of anxiety and/or depression within last 3 years OR * MOB's symptoms currently being treated with medication and/or therapy. Please contact the Clinical Social Worker if needs arise, by MOB request, or if MOB scores greater than 9/yes to question 10 on Edinburgh Postpartum Depression Screen.  Annalyssa Thune Boyd-Gilyard, MSW, LCSW Clinical Social Work [phone]  

## 2018-01-17 NOTE — Progress Notes (Signed)
POSTPARTUM PROGRESS NOTE  Post Partum Day 1 Subjective:  Yvonne Trujillo is a 32 y.o. G1P1001 3776w1d s/p SVD with 2nd degree perineal laceration.  She complains of back pain (7) around the epidural site that is moderately controlled on ibuprofen this morning. Otherwise, she had  no acute events overnight.  Pt denies problems with ambulating, voiding or po intake.  She denies nausea or vomiting.  Pain is moderately controlled.  She has had flatus. She has not had bowel movement.  Lochia Small.   Objective: Blood pressure 128/77, pulse 83, temperature 97.9 F (36.6 C), temperature source Oral, resp. rate 18, height 5\' 1"  (1.549 m), weight 106.6 kg, last menstrual period 04/17/2017, SpO2 99 %, unknown if currently breastfeeding.  Physical Exam:  General: alert, cooperative and no distress Lochia: normal flow Chest: CTAB Heart: RRR no m/r/g Abdomen: +BS, soft, nontender,  Uterine Fundus: unable to palpate DVT Evaluation: No calf swelling or tenderness Extremities: no LE edema b/l  Recent Labs    01/15/18 2010 01/16/18 0545  HGB 10.4* 10.1*  HCT 31.8* 30.8*    Assessment/Plan:  ASSESSMENT: Yvonne Trujillo is a 32 y.o. G1P1001 376w1d s/p SVD with 2nd degree perineal laceration.  #Breastfeeding & Bottle Feeding  #Contraception: Progesterone Only Pills #Circumcision to be completed outpatient #Plan for discharge PPD #2 or 3    LOS: 2 days   Basilio CairoFolafunmi Steven Veazie, Medical Student 01/17/2018, 8:00 AM

## 2018-01-17 NOTE — Lactation Note (Signed)
This note was copied from a baby's chart. Lactation Consultation Note  Patient Name: Yvonne Trujillo [place] ZOXWR'UToday's Date: 01/17/2018   RN had assisted in getting infant latched. Mom reports this was infant's 1st good feeding at breast. Dad was shown how to use the "teacup hold" when infant is ready to latch again. Mom's nipples are atraumatic & she reports + breast changes w/pregnancy. Mom has history of nipple piercings, but reports that they were removed about 5 years ago.   Specifics of an asymmetric latch shown via [employer] website animation.   Lurline HareRichey, [place] [facility] 01/17/2018, 9:17 PM

## 2018-01-18 ENCOUNTER — Encounter: Payer: Self-pay | Admitting: Family Medicine

## 2018-01-18 MED ORDER — ACETAMINOPHEN 325 MG PO TABS: 650.0000 mg | ORAL_TABLET | ORAL | 0 refills | Status: DC | PRN

## 2018-01-18 MED ORDER — IBUPROFEN 600 MG PO TABS: 600.0000 mg | ORAL_TABLET | Freq: Four times a day (QID) | ORAL | 0 refills | Status: DC

## 2018-01-18 NOTE — Discharge Summary (Addendum)
OB Discharge Summary     Patient Name: Yvonne Trujillo DOB: 05/08/1985 MRN: [id number]  Date of admission: 01/15/2018 Delivering MD: Mirian MoFRANK, PETER   Date of discharge: 01/18/2018  Admitting diagnosis: 39 WKS WATER BROKE CTX AND SOME SPOTTING Intrauterine pregnancy: 6788w1d     Secondary diagnosis:  Active Problems:   Gestational hypertension  Additional problems: none     Discharge diagnosis: Term Pregnancy Delivered                                                                                                Post partum procedures:none  Augmentation: Pitocin, Cytotec and Foley Balloon  Complications: None  Hospital course:  Onset of Labor With Vaginal Delivery     [age] y.o. yo G1P1001 at 2988w1d was admitted in Latent Labor on 01/15/2018. Patient had an uncomplicated labor course as follows:  Membrane Rupture Time/Date: 4:30 PM ,01/15/2018   Intrapartum Procedures: Episiotomy: None [1]                                         Lacerations:  2nd degree [3];Perineal [11];Periurethral [8]  Patient had a delivery of a Viable infant. 01/16/2018  Information for the patient's newborn:  Ober, Skeet LatchBoy Davielle [[id number]][[id number]]  Delivery Method: Vaginal, Spontaneous(Filed from Delivery Summary)    Pateint had an uncomplicated postpartum course.  She is ambulating, tolerating a regular diet, passing flatus, and urinating well. Patient is discharged home in stable condition on 01/18/18.   Physical exam  Vitals:   01/17/18 0500 01/17/18 1415 01/17/18 2130 01/18/18 0452  BP: 128/77 (!) 130/94 136/85 123/83  Pulse: 83 90 82 84  Resp: 18 17 16 18   Temp:  97.8 F (36.6 C) 97.9 F (36.6 C) (!) 97.5 F (36.4 C)  TempSrc:  Oral Oral Oral  SpO2:  99%    Weight:      Height:       General: alert, cooperative and no distress Lochia: appropriate Uterine Fundus: firm Incision: N/A DVT Evaluation: No evidence of DVT seen on physical exam. Labs: Lab Results  Component Value Date   WBC 15.8 (H) 01/16/2018    HGB 10.1 (L) 01/16/2018   HCT 30.8 (L) 01/16/2018   MCV 82.6 01/16/2018   PLT 330 01/16/2018   CMP Latest Ref Rng & Units 01/15/2018  Glucose 70 - 99 mg/dL 79  BUN 6 - 20 mg/dL 10  Creatinine 9.560.44 - 2.131.00 mg/dL 0.860.65  Sodium 578135 - 469145 mmol/L 134(L)  Potassium 3.5 - 5.1 mmol/L 4.0  Chloride 98 - 111 mmol/L 106  CO2 22 - 32 mmol/L 18(L)  Calcium 8.9 - 10.3 mg/dL 6.2(X8.7(L)  Total Protein 6.5 - 8.1 g/dL 6.1(L)  Total Bilirubin 0.3 - 1.2 mg/dL 0.5  Alkaline Phos 38 - 126 U/L 175(H)  AST 15 - 41 U/L 13(L)  ALT 0 - 44 U/L 10    Discharge instruction: per After Visit Summary and "Baby and Me Booklet".  After visit meds:  Allergies as of 01/18/2018  No Known Allergies     Medication List    STOP taking these medications   aspirin EC 81 MG tablet   valACYclovir 1000 MG tablet Commonly known as:  VALTREX     TAKE these medications   acetaminophen 325 MG tablet Commonly known as:  TYLENOL Take 2 tablets (650 mg total) by mouth every 4 (four) hours as needed (for pain scale < 4).   albuterol 108 (90 Base) MCG/ACT inhaler Commonly known as:  PROVENTIL HFA;VENTOLIN HFA Inhale 2 puffs into the lungs every 6 (six) hours as needed.   cyclobenzaprine 10 MG tablet Commonly known as:  FLEXERIL Take 1 tablet (10 mg total) by mouth every 8 (eight) hours as needed for muscle spasms.   docusate sodium 100 MG capsule Commonly known as:  COLACE Take 1 capsule (100 mg total) by mouth 2 (two) times daily.   ferrous fumarate 325 (106 Fe) MG Tabs tablet Commonly known as:  HEMOCYTE - 106 mg FE Take 1 tablet (106 mg of iron total) by mouth 2 (two) times daily.   ibuprofen 600 MG tablet Commonly known as:  ADVIL,MOTRIN Take 1 tablet (600 mg total) by mouth every 6 (six) hours.   Prenatal Vitamins 0.8 MG tablet Take 1 tablet by mouth daily.   promethazine 25 MG tablet Commonly known as:  PHENERGAN Take 0.5 tablets (12.5 mg total) by mouth every 6 (six) hours as needed for nausea or  vomiting (migraines).       Diet: routine diet  Activity: Advance as tolerated. Pelvic rest for 6 weeks.   Outpatient follow up:6 weeks Follow up Appt: Future Appointments  Date Time Provider Department Center  01/21/2018 10:30 AM [facility] NURSE [facility]   Follow up Visit:No follow-ups on file.  Postpartum contraception: Progesterone only pills  Newborn Data: Live born female  Birth Weight: 6 lb 15.3 oz (3155 g) APGAR: 9, 9  Newborn Delivery   Birth date/time:  01/16/2018 08:40:00 Delivery type:  Vaginal, Spontaneous     Baby Feeding: Breast Disposition:home with mother   01/18/2018 Mirian Mo, MD   OB FELLOW DISCHARGE ATTESTATION  I have seen and examined this patient and agree with above documentation in the resident's note.   Marcy Siren, D.O. OB Fellow  01/18/2018, 4:23 PM

## 2018-01-18 NOTE — Lactation Note (Signed)
This note was copied from a baby's chart. Lactation Consultation Note  Patient Name: Yvonne Trujillo WJXBJ'Y Date: 01/18/2018 Reason for consult: Term;Infant weight loss;Follow-up assessment(baby last fed at 1030 from a bottle 25 ml . mom./ baby D/C see LC note )  Baby is 86 hours old  [place] reviewed doc flow sheets. Baby hasn't fed since 1030 am.  Per mom right nipple has tiny blood blister. Mom requested to have it checked.  LC noted it to be very tiny, no drainage, appeared to be reabsorbing. LC reviewed hand expressing.  Several drops of colostrum and had mom apply to her nipples.  LC noted short shaft nipples and swollen areolas, compressible, even more compressible after  Reverse pressure.  LC highly recommended prior to latch - breast massage, hand express, pre-pump to make the nipple and  Areola more elastic, and reverse pressure. Mom seemed surprised colostrum was leaking.  Sore nipple and engorgement prevention and tx reviewed.  Mom already has shells, hand pump, DEBP kit and will have a DEBP from her sister at home.  LC reviewed supply and demand a encouraged mom to keep trying latching,since the baby has been  Supplemented may need a small appetizer 1st , and then latch. If the baby latches STS, and LC showed  Mom and dad how far the baby's mouth should be on the areola for depth.  Feed 1st breast, ( explained when the baby is latched it should be a tug similar to the pump)  When the baby finishes 1st breast/ offer the 2nd breast. If he is happy don't supplement.  If not keep supplement for now around 25 -30 ml ( EBM or formula ).  LC stressed the importance of getting the post pumping 10 -15 mins both breast until the milk comes in and  The baby is latching well.  Per mom i'm ok pumping and bottle feeding if I have to if the latching doesn't work out.  LC reassured mom she is in th early stages of breast feeding and to give it time.  LC offered mom and LC O/P appt. And mom declined  to make it for today and will call and has the number. Mother informed of post-discharge support and given phone number to the lactation department, including services for phone call assistance; out-patient appointments; and breastfeeding support group. List of other breastfeeding resources in the community given in the handout. Encouraged mother to call for problems or concerns related to breastfeeding.   Maternal Data Has patient been taught Hand Expression?: Yes(LC reviewed )  Feeding Feeding Type: Bottle Fed - Formula  LATCH Score                   Interventions Interventions: Breast feeding basics reviewed  Lactation Tools Discussed/Used Tools: Shells;Pump;Flanges Flange Size: 27;Other (comment)(#27 is still a good fit ) Shell Type: Inverted Breast pump type: Double-Electric Breast Pump;Manual WIC Program: No(per mom has to call for appt. ) Pump Review: Milk Storage Initiated by:: MAI / reviewed /    Consult Status Consult Status: Complete Date: ([place] offered mom and Lc O/P appt and mom declined , mentioned she will call )    Yvonne Trujillo 01/18/2018, 12:11 PM

## 2018-01-18 NOTE — Discharge Instructions (Signed)
Vaginal Delivery, Care After °Refer to this sheet in the next few weeks. These instructions provide you with information about caring for yourself after vaginal delivery. Your health care provider may also give you more specific instructions. Your treatment has been planned according to current medical practices, but problems sometimes occur. Call your health care provider if you have any problems or questions. °What can I expect after the procedure? °After vaginal delivery, it is common to have: °· Some bleeding from your vagina. °· Soreness in your abdomen, your vagina, and the area of skin between your vaginal opening and your anus (perineum). °· Pelvic cramps. °· Fatigue. ° °Follow these instructions at home: °Medicines °· Take over-the-counter and prescription medicines only as told by your health care provider. °· If you were prescribed an antibiotic medicine, take it as told by your health care provider. Do not stop taking the antibiotic until it is finished. °Driving ° °· Do not drive or operate heavy machinery while taking prescription pain medicine. °· Do not drive for 24 hours if you received a sedative. °Lifestyle °· Do not drink alcohol. This is especially important if you are breastfeeding or taking medicine to relieve pain. °· Do not use tobacco products, including cigarettes, chewing tobacco, or e-cigarettes. If you need help quitting, ask your health care provider. °Eating and drinking °· Drink at least 8 eight-ounce glasses of water every day unless you are told not to by your health care provider. If you choose to breastfeed your baby, you may need to drink more water than this. °· Eat high-fiber foods every day. These foods may help prevent or relieve constipation. High-fiber foods include: °? Whole grain cereals and breads. °? Brown rice. °? Beans. °? Fresh fruits and vegetables. °Activity °· Return to your normal activities as told by your health care provider. Ask your health care provider  what activities are safe for you. °· Rest as much as possible. Try to rest or take a nap when your baby is sleeping. °· Do not lift anything that is heavier than your baby or 10 lb (4.5 kg) until your health care provider says that it is safe. °· Talk with your health care provider about when you can engage in sexual activity. This may depend on your: °? Risk of infection. °? Rate of healing. °? Comfort and desire to engage in sexual activity. °Vaginal Care °· If you have an episiotomy or a vaginal tear, check the area every day for signs of infection. Check for: °? More redness, swelling, or pain. °? More fluid or blood. °? Warmth. °? Pus or a bad smell. °· Do not use tampons or douches until your health care provider says this is safe. °· Watch for any blood clots that may pass from your vagina. These may look like clumps of dark red, brown, or black discharge. °General instructions °· Keep your perineum clean and dry as told by your health care provider. °· Wear loose, comfortable clothing. °· Wipe from front to back when you use the toilet. °· Ask your health care provider if you can shower or take a bath. If you had an episiotomy or a perineal tear during labor and delivery, your health care provider may tell you not to take baths for a certain length of time. °· Wear a bra that supports your breasts and fits you well. °· If possible, have someone help you with household activities and help care for your baby for at least a few days after   you leave the hospital. °· Keep all follow-up visits for you and your baby as told by your health care provider. This is important. °Contact a health care provider if: °· You have: °? Vaginal discharge that has a bad smell. °? Difficulty urinating. °? Pain when urinating. °? A sudden increase or decrease in the frequency of your bowel movements. °? More redness, swelling, or pain around your episiotomy or vaginal tear. °? More fluid or blood coming from your episiotomy or  vaginal tear. °? Pus or a bad smell coming from your episiotomy or vaginal tear. °? A fever. °? A rash. °? Little or no interest in activities you used to enjoy. °? Questions about caring for yourself or your baby. °· Your episiotomy or vaginal tear feels warm to the touch. °· Your episiotomy or vaginal tear is separating or does not appear to be healing. °· Your breasts are painful, hard, or turn red. °· You feel unusually sad or worried. °· You feel nauseous or you vomit. °· You pass large blood clots from your vagina. If you pass a blood clot from your vagina, save it to show to your health care provider. Do not flush blood clots down the toilet without having your health care provider look at them. °· You urinate more than usual. °· You are dizzy or light-headed. °· You have not breastfed at all and you have not had a menstrual period for 12 weeks after delivery. °· You have stopped breastfeeding and you have not had a menstrual period for 12 weeks after you stopped breastfeeding. °Get help right away if: °· You have: °? Pain that does not go away or does not get better with medicine. °? Chest pain. °? Difficulty breathing. °? Blurred vision or spots in your vision. °? Thoughts about hurting yourself or your baby. °· You develop pain in your abdomen or in one of your legs. °· You develop a severe headache. °· You faint. °· You bleed from your vagina so much that you fill two sanitary pads in one hour. °This information is not intended to replace advice given to you by your health care provider. Make sure you discuss any questions you have with your health care provider. °Document Released: 04/17/2000 Document Revised: 10/02/2015 Document Reviewed: 05/05/2015 °Elsevier Interactive Patient Education © 2018 Elsevier Inc. ° °

## 2018-01-21 ENCOUNTER — Ambulatory Visit: Payer: Medicaid Other

## 2018-01-21 VITALS — BP 127/85 | HR 72

## 2018-01-21 DIAGNOSIS — Z0130 Encounter for examination of blood pressure without abnormal findings: Secondary | ICD-10-CM

## 2018-01-21 NOTE — Progress Notes (Signed)
Subjective:  Yvonne Trujillo is a 32 y.o. female here for BP check and mood check. [place] was 0  Hypertension ROS: not taking any medications for blood pressure. Patient reports having a headache today but thinks it lack of sleep. She reports some swelling at this time but has been keeping feet up as much as possible.   Objective:  BP 127/85   Pulse 72   Appearance alert, well appearing, and in no distress General exam BP noted to be well controlled today in office.    Assessment:   Blood Pressure normal range.   Plan:  Follow up in four weeks for postpartum appointment.

## 2018-02-15 ENCOUNTER — Ambulatory Visit (INDEPENDENT_AMBULATORY_CARE_PROVIDER_SITE_OTHER): Payer: Medicaid Other | Admitting: Family Medicine

## 2018-02-15 DIAGNOSIS — Z1389 Encounter for screening for other disorder: Secondary | ICD-10-CM

## 2018-02-15 DIAGNOSIS — Z30011 Encounter for initial prescription of contraceptive pills: Secondary | ICD-10-CM

## 2018-02-15 MED ORDER — NORETHIN ACE-ETH ESTRAD-FE 1-20 MG-MCG PO TABS: 1.0000 | ORAL_TABLET | Freq: Every day | ORAL | 3 refills | Status: DC

## 2018-02-15 NOTE — Progress Notes (Signed)
Post Partum Exam  Yvonne Trujillo is a 32 y.o. G70P1001 female who presents for a postpartum visit. She is 4 weeks postpartum following a spontaneous vaginal delivery. I have fully reviewed the prenatal and intrapartum course. The delivery was at 39.1 gestational weeks.  Anesthesia: epidural. Postpartum course has been uncomplicated. Baby's course has been uncomplicated. Baby is feeding by bottle using Enfamil Gentle Ease. Bleeding staining only. Bowel function is normal. Bladder function is normal. Patient is not sexually active. Contraception method is OCP (estrogen/progesterone). Postpartum depression screening:neg I have reviewed the above information and concur.  Last pap smear done 06/23/17 and was Normal  Review of Systems Pertinent items noted in HPI and remainder of comprehensive ROS otherwise negative.    Objective:  Blood pressure 110/78, pulse 88, weight 207 lb (93.9 kg), unknown if currently breastfeeding.  General:  alert, cooperative and appears stated age  Lungs: normal effort  Heart:  regular rate and rhythm  Abdomen: soft, non-tender; bowel sounds normal; no masses,  no organomegaly        Assessment:    Normal postpartum exam. Pap smear not done at today's visit.   Plan:   1. Contraception: OCP (estrogen/progesterone) 2. CPE in 3 months 3. Follow up in: 3 months for CPE or as needed.

## 2018-02-15 NOTE — Patient Instructions (Signed)
Postpartum Depression and Baby Blues The postpartum period begins right after the birth of a baby. During this time, there is often a great amount of joy and excitement. It is also a time of many changes in the life of the parents. Regardless of how many times a mother gives birth, each child brings new challenges and dynamics to the family. It is not unusual to have feelings of excitement along with confusing shifts in moods, emotions, and thoughts. All mothers are at risk of developing postpartum depression or the "baby blues." These mood changes can occur right after giving birth, or they may occur many months after giving birth. The baby blues or postpartum depression can be mild or severe. Additionally, postpartum depression can go away rather quickly, or it can be a long-term condition. What are the causes? Raised hormone levels and the rapid drop in those levels are thought to be a main cause of postpartum depression and the baby blues. A number of hormones change during and after pregnancy. Estrogen and progesterone usually decrease right after the delivery of your baby. The levels of thyroid hormone and various cortisol steroids also rapidly drop. Other factors that play a role in these mood changes include major life events and genetics. What increases the risk? If you have any of the following risks for the baby blues or postpartum depression, know what symptoms to watch out for during the postpartum period. Risk factors that may increase the likelihood of getting the baby blues or postpartum depression include:  Having a personal or family history of depression.  Having depression while being pregnant.  Having premenstrual mood issues or mood issues related to oral contraceptives.  Having a lot of life stress.  Having marital conflict.  Lacking a social support network.  Having a baby with special needs.  Having health problems, such as diabetes.  What are the signs or  symptoms? Symptoms of baby blues include:  Brief changes in mood, such as going from extreme happiness to sadness.  Decreased concentration.  Difficulty sleeping.  Crying spells, tearfulness.  Irritability.  Anxiety.  Symptoms of postpartum depression typically begin within the first month after giving birth. These symptoms include:  Difficulty sleeping or excessive sleepiness.  Marked weight loss.  Agitation.  Feelings of worthlessness.  Lack of interest in activity or food.  Postpartum psychosis is a very serious condition and can be dangerous. Fortunately, it is rare. Displaying any of the following symptoms is cause for immediate medical attention. Symptoms of postpartum psychosis include:  Hallucinations and delusions.  Bizarre or disorganized behavior.  Confusion or disorientation.  How is this diagnosed? A diagnosis is made by an evaluation of your symptoms. There are no medical or lab tests that lead to a diagnosis, but there are various questionnaires that a health care provider may use to identify those with the baby blues, postpartum depression, or psychosis. Often, a screening tool called the Edinburgh Postnatal Depression Scale is used to diagnose depression in the postpartum period. How is this treated? The baby blues usually goes away on its own in 1-2 weeks. Social support is often all that is needed. You will be encouraged to get adequate sleep and rest. Occasionally, you may be given medicines to help you sleep. Postpartum depression requires treatment because it can last several months or longer if it is not treated. Treatment may include individual or group therapy, medicine, or both to address any social, physiological, and psychological factors that may play a role in the   depression. Regular exercise, a healthy diet, rest, and social support may also be strongly recommended. Postpartum psychosis is more serious and needs treatment right away.  Hospitalization is often needed. Follow these instructions at home:  Get as much rest as you can. Nap when the baby sleeps.  Exercise regularly. Some women find yoga and walking to be beneficial.  Eat a balanced and nourishing diet.  Do little things that you enjoy. Have a cup of tea, take a bubble bath, read your favorite magazine, or listen to your favorite music.  Avoid alcohol.  Ask for help with household chores, cooking, grocery shopping, or running errands as needed. Do not try to do everything.  Talk to people close to you about how you are feeling. Get support from your partner, family members, friends, or other new moms.  Try to stay positive in how you think. Think about the things you are grateful for.  Do not spend a lot of time alone.  Only take over-the-counter or prescription medicine as directed by your health care provider.  Keep all your postpartum appointments.  Let your health care provider know if you have any concerns. Contact a health care provider if: You are having a reaction to or problems with your medicine. Get help right away if:  You have suicidal feelings.  You think you may harm the baby or someone else. This information is not intended to replace advice given to you by your health care provider. Make sure you discuss any questions you have with your health care provider. Document Released: 01/23/2004 Document Revised: 09/26/2015 Document Reviewed: 01/30/2013 Elsevier Interactive Patient Education  2017 Elsevier Inc.  

## 2018-09-21 ENCOUNTER — Other Ambulatory Visit: Payer: Self-pay | Admitting: Family Medicine

## 2018-09-21 NOTE — Telephone Encounter (Signed)
Pt schedule cpx 8/18 Needs refills on Albuterol Valtrex xantax imitrex zofran  [facility]  Best number [phone]

## 2018-09-22 NOTE — Telephone Encounter (Signed)
Is she breast feeding?   Let me know and I will refill what I can

## 2018-09-22 NOTE — Telephone Encounter (Signed)
Albuterol is the only thing on that list I see her med list?   Looks like she had a baby in the fall?  Is she already back on those or adding back?  Was gyn giving them?  Is she breast feeding?

## 2018-09-22 NOTE — Telephone Encounter (Signed)
Spoke to pt. She has no insurance right now. Tried to set up CPE but they told her Dr Milinda Antis was out to August for that. She had left over medications and after the baby was born, she went back on all of her meds including Imitrex 100mg , Zofran 38m, alprazolam 0.5mg , and valacyclovir 500mg ,

## 2018-09-23 MED ORDER — ALBUTEROL SULFATE HFA 108 (90 BASE) MCG/ACT IN AERS: 2.0000 | INHALATION_SPRAY | RESPIRATORY_TRACT | 3 refills | Status: DC | PRN

## 2018-09-23 MED ORDER — VALACYCLOVIR HCL 1 G PO TABS: ORAL_TABLET | ORAL | 3 refills | Status: AC

## 2018-09-23 MED ORDER — ALPRAZOLAM 0.5 MG PO TABS: 0.5000 mg | ORAL_TABLET | Freq: Two times a day (BID) | ORAL | 0 refills | Status: DC | PRN

## 2018-09-23 MED ORDER — SUMATRIPTAN SUCCINATE 100 MG PO TABS: 100.0000 mg | ORAL_TABLET | ORAL | 5 refills | Status: DC | PRN

## 2018-09-23 MED ORDER — ONDANSETRON HCL 4 MG PO TABS: 4.0000 mg | ORAL_TABLET | Freq: Three times a day (TID) | ORAL | 3 refills | Status: DC | PRN

## 2018-09-23 NOTE — Telephone Encounter (Signed)
Pt is not breast feeding at all. No f/u call is necessary if Dr. Milinda Antis is going to refill meds because she works at [employer] [facility] so she will see them when they are refilled

## 2018-11-02 ENCOUNTER — Encounter: Payer: Self-pay | Admitting: Family Medicine

## 2018-11-02 ENCOUNTER — Other Ambulatory Visit: Payer: Self-pay

## 2018-11-02 ENCOUNTER — Ambulatory Visit (INDEPENDENT_AMBULATORY_CARE_PROVIDER_SITE_OTHER): Payer: Self-pay | Admitting: Family Medicine

## 2018-11-02 DIAGNOSIS — F418 Other specified anxiety disorders: Secondary | ICD-10-CM

## 2018-11-02 DIAGNOSIS — F419 Anxiety disorder, unspecified: Secondary | ICD-10-CM

## 2018-11-02 MED ORDER — BUPROPION HCL ER (XL) 150 MG PO TB24: 150.0000 mg | ORAL_TABLET | Freq: Every day | ORAL | 3 refills | Status: DC

## 2018-11-02 NOTE — Progress Notes (Signed)
Virtual Visit via Video Note  I connected with Yvonne Trujillo R Osley on 11/02/18 at 12:00 PM EDT by a video enabled telemedicine application and verified that I am speaking with the correct person using two identifiers.  Location: Patient: home Provider: office    I discussed the limitations of evaluation and management by telemedicine and the availability of in person appointments. The patient expressed understanding and agreed to proceed.  History of Present Illness: Has a 649 mo old baby at home  Pregnancy went well as well as labor   Had some depression at first  Now morso anxiety   Has had wellbutrin in the past- it worked well for her mood  Wants to avoid xanax due to having baby at home   occ panic attack-has not had one in a long time  Her symptoms incl- sob/chest tightness/breathing fast /tearfulness w/o it  In general -anxiety symptoms include worrying/ irritability and short tempered  Less patient   Not sad or depressed No suicidal thoughts at all  Not vegetative  Appetite - fine/overall eating more  Sleeping is ok   In the past lexapro made her affect blunt-did not like it  Therapy did not help in the past   Not breast feeding    Stressors: Pandemic -worries a lot (moreso in the beginning)  She had to get tested yesterday (waiting on a result) -exposed at work   [employer]-is a worried mom all the time /as expected   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  Eyes: Negative for blurred vision, discharge and redness.  Respiratory: Negative for cough and shortness of breath.   Cardiovascular: Negative for chest pain and palpitations.  Gastrointestinal: Negative for nausea.   Musculoskeletal: Negative for myalgias.  Skin: Negative for rash.  Neurological: Negative for dizziness, tingling and headaches.  Endo/Heme/Allergies: Negative for polydipsia.  Psychiatric/Behavioral: Negative for depression, memory loss, substance abuse and suicidal ideas. The patient is nervous/anxious. The patient does not have insomnia.     Patient Active Problem List   Diagnosis Date Noted  . History of gestational hypertension 01/15/2018  . Herpes simplex 01/18/2015  . Hyperlipidemia 04/25/2013  . Migraine 04/24/2013  . Depression with anxiety 04/24/2013  . Hirsutism 04/24/2013  . Acne 04/24/2013  . History of kidney stones 04/24/2013   Past Medical History:  Diagnosis Date  . Asthma   . Concussion   . History of depression   . Kidney infection   . Kidney stone   . Migraine   . Supervision of normal first pregnancy, antepartum 06/23/2017    [facility] Dating  Blood type:    Genetic Screen 1 Screen:    AFP:     Quad:     NIPS: Antibody:  Anatomic US  Rubella:   GTT Early:               Third trimester:  RPR:    Flu vaccine  HBsAg:    TDaP vaccine  Rhogam: HIV:   Baby Food        Breast                                        GBS: (For PCN allergy, check sensitivities) Cont   Past Surgical History:  Procedure Laterality Date  . APPENDECTOMY    . EXTRACORPOREAL SHOCK WAVE LITHOTRIPSY Left 07/09/2016   Procedure: EXTRACORPOREAL SHOCK WAVE LITHOTRIPSY (ESWL);  Surgeon: Hollice Espy, MD;  Location: [facility] ORS;  Service: Urology;  Laterality: Left;   Social History   Tobacco Use  . Smoking status: Former Smoker    Years: 6.00    Types: Cigarettes  . Smokeless tobacco: Never Used  Substance Use Topics  . Alcohol use: Not Currently    Alcohol/week: 0.0 standard drinks    Comment: none with preg  . Drug use: Not Currently    Types: Marijuana    Comment: none with preg   Family History  Problem Relation Age of  Onset  . Alcohol abuse Maternal Uncle   . Hyperlipidemia Mother   . Hypertension Mother   . Heart disease Mother   . Hyperlipidemia Father   . Hypertension Father   . Diabetes Cousin   . Bladder Cancer Neg Hx   . Kidney cancer Neg Hx    No Known Allergies Current Outpatient Medications on File Prior to Visit  Medication Sig Dispense Refill  . albuterol (VENTOLIN HFA) 108 (90 Base) MCG/ACT inhaler Inhale 2 puffs into the lungs every 4 (four) hours as needed. 1 Inhaler 3  . ALPRAZolam (XANAX) 0.5 MG tablet Take 1 tablet (0.5 mg total) by mouth 2 (two) times daily as needed for anxiety. 30 tablet 0  . norethindrone-ethinyl estradiol (JUNEL FE 1/20) 1-20 MG-MCG tablet Take 1 tablet by mouth daily. 3 Package 3  . ondansetron (ZOFRAN) 4 MG tablet Take 1 tablet (4 mg total) by mouth every 8 (eight) hours as needed for nausea or vomiting. 30 tablet 3  . SUMAtriptan (IMITREX) 100 MG tablet Take 1 tablet (100 mg total) by mouth every 2 (two) hours as needed for migraine. May repeat in 2 hours if headache persists or recurs. 10 tablet 5  . valACYclovir (VALTREX) 1000 MG tablet Take 2 pills by mouth twice daily for 1 day for a cold sore 4 tablet 3   No current facility-administered medications on file prior to visit.     Observations/Objective: Patient appears well, in no distress/not tearful  Holding baby at times  Weight is baseline (overwt) No facial swelling or asymmetry Normal voice-not hoarse and no slurred speech No obvious tremor or mobility impairment Moving neck and UEs normally Able to hear the call well  No cough or shortness of breath during interview  Talkative and mentally sharp with no cognitive changes No skin changes on face or neck , no rash or pallor Affect is normal (not seemingly depressed today)   Assessment and Plan: Problem List Items Addressed This Visit      Other   Depression with anxiety - Primary    More anxiety than depression (PHQ rev)  In the past,  wellbutrin works well (does not over stimulate) Reviewed stressors/ coping techniques/symptoms/ support sources/ tx options and side effects in detail today -stressors incl pandemic/work/new baby  Disc imp of self care Offered counseling-pt declined (has done before) She talks to her mother for support  Px wellbutrin  xl 150 mg daily  Discussed expectations of thismedication including time to effectiveness and mechanism of action, also poss of side effects (early and late)- including mental fuzziness, weight or appetite change, nausea and poss of worse dep or anxiety (even suicidal thoughts)  Pt voiced understanding and will stop med and update if this occurs   Consider trial of buspar if worse or no imp  Will update       Relevant Medications   buPROPion (WELLBUTRIN XL) 150 MG 24 hr tablet    Other Visit Diagnoses    Anxiety       Relevant Medications   buPROPion (WELLBUTRIN XL) 150 MG 24 hr tablet      Follow Up Instructions: Start wellbutrin as planned Update if not starting to improve in a month or if worsening   If side effects or worse- or suicidal thoughts, stop medicine and call  Stay active Practice good self care Get outdoors    I discussed the assessment and treatment plan with the patient. The patient was provided an opportunity to ask questions and all were answered. The patient agreed with the plan and demonstrated an understanding of the instructions.   The patient was advised to call back or seek an in-person evaluation if the symptoms worsen or if the condition fails to improve as anticipated.     Roxy MannsMarne Torin Modica, MD

## 2018-11-02 NOTE — Assessment & Plan Note (Addendum)
More anxiety than depression (PHQ rev)  In the past, wellbutrin works well (does not over stimulate) Reviewed stressors/ coping techniques/symptoms/ support sources/ tx options and side effects in detail today -stressors incl pandemic/work/new baby  Disc imp of self care Offered counseling-pt declined (has done before) She talks to her mother for support  Px wellbutrin xl 150 mg daily  Discussed expectations of thismedication including time to effectiveness and mechanism of action, also poss of side effects (early and late)- including mental fuzziness, weight or appetite change, nausea and poss of worse dep or anxiety (even suicidal thoughts)  Pt voiced understanding and will stop med and update if this occurs   Consider trial of buspar if worse or no imp  Will update

## 2018-11-02 NOTE — Patient Instructions (Signed)
Start wellbutrin as planned Update if not starting to improve in a month or if worsening   If side effects or worse- or suicidal thoughts, stop medicine and call  Stay active Practice good self care Get outdoors

## 2018-12-07 ENCOUNTER — Ambulatory Visit (INDEPENDENT_AMBULATORY_CARE_PROVIDER_SITE_OTHER): Payer: Self-pay | Admitting: Family Medicine

## 2018-12-07 ENCOUNTER — Encounter: Payer: Self-pay | Admitting: Family Medicine

## 2018-12-07 VITALS — BP 132/95 | HR 92 | Temp 98.3°F | Ht 61.0 in | Wt 220.0 lb

## 2018-12-07 DIAGNOSIS — B9689 Other specified bacterial agents as the cause of diseases classified elsewhere: Secondary | ICD-10-CM

## 2018-12-07 DIAGNOSIS — J019 Acute sinusitis, unspecified: Secondary | ICD-10-CM

## 2018-12-07 MED ORDER — AMOXICILLIN-POT CLAVULANATE 875-125 MG PO TABS: 1.0000 | ORAL_TABLET | Freq: Two times a day (BID) | ORAL | 0 refills | Status: AC

## 2018-12-07 MED ORDER — FLUCONAZOLE 150 MG PO TABS: 150.0000 mg | ORAL_TABLET | Freq: Once | ORAL | 0 refills | Status: AC

## 2018-12-07 NOTE — Progress Notes (Signed)
Virtual visit completed through [employer]. Due to national recommendations of social distancing due to COVID-19, a virtual visit is felt to be most appropriate for this patient at this time. Reviewed limitations of a virtual visit.   Patient location: at work Provider location: Financial controller at [facility], office If any vitals were documented, they were collected by patient at home unless specified below.    BP (!) 132/95   Pulse 92   Temp 98.3 F (36.8 C) (Temporal)   Ht 5\' 1"  (1.549 m)   Wt 220 lb (99.8 kg)   LMP 11/28/2018   BMI 41.57 kg/m    CC: sinus symptoms Subjective:    Patient ID: Yvonne Trujillo, female    DOB: February 28, 1986, 33 y.o.   MRN: [id number]  HPI: Yvonne Trujillo is a 33 y.o. female presenting on 12/07/2018 for Sinus Problem (C/o sinus pressure, nasal congestion and runny nose.  Started 1.5-2 wks ago.  Tried Sudafed) and Eye Problem (C/o redness, crust and bilateral eye drainage.  Started last night. )   1.5 wk h/o rhinorrhea, stuffy nose, pressure in forehead, and bilateral maxillary sinus, worse with bending forward. Last night R the L eye very red. Eyes feel irritated and swollen. Did have some matting of both eyes this morning. No itching.   No fever, ST, cough, dyspnea, loss of taste or smell, abd pain, diarrhea, nausea, body aches.   Treating with pseudophed and zyrtec and ibuprofen with temporary benefit.  10 mo son teething at home.  Non smoker.  H/o asthma, no current wheezing or dyspnea.      Relevant past medical, surgical, family and social history reviewed and updated as indicated. Interim medical history since our last visit reviewed. Allergies and medications reviewed and updated. Outpatient Medications Prior to Visit  Medication Sig Dispense Refill  . albuterol (VENTOLIN HFA) 108 (90 Base) MCG/ACT inhaler Inhale 2 puffs into the lungs every 4 (four) hours as needed. 1 Inhaler 3  . ALPRAZolam (XANAX) 0.5 MG tablet Take 1 tablet (0.5 mg total) by mouth 2  (two) times daily as needed for anxiety. 30 tablet 0  . buPROPion (WELLBUTRIN XL) 150 MG 24 hr tablet Take 1 tablet (150 mg total) by mouth daily. 90 tablet 3  . norethindrone-ethinyl estradiol (JUNEL FE 1/20) 1-20 MG-MCG tablet Take 1 tablet by mouth daily. 3 Package 3  . ondansetron (ZOFRAN) 4 MG tablet Take 1 tablet (4 mg total) by mouth every 8 (eight) hours as needed for nausea or vomiting. 30 tablet 3  . SUMAtriptan (IMITREX) 100 MG tablet Take 1 tablet (100 mg total) by mouth every 2 (two) hours as needed for migraine. May repeat in 2 hours if headache persists or recurs. 10 tablet 5  . valACYclovir (VALTREX) 1000 MG tablet Take 2 pills by mouth twice daily for 1 day for a cold sore 4 tablet 3   No facility-administered medications prior to visit.      Per HPI unless specifically indicated in ROS section below Review of Systems Objective:    BP (!) 132/95   Pulse 92   Temp 98.3 F (36.8 C) (Temporal)   Ht 5\' 1"  (1.549 m)   Wt 220 lb (99.8 kg)   LMP 11/28/2018   BMI 41.57 kg/m   Wt Readings from Last 3 Encounters:  12/07/18 220 lb (99.8 kg)  02/15/18 207 lb (93.9 kg)  01/15/18 235 lb 1.9 oz (106.6 kg)     Physical exam: Gen: alert, NAD, not ill  appearing, congested with speaking Eyes: mild bilateral bulbar conjunctival injection Pulm: speaks in complete sentences without increased work of breathing Psych: normal mood, normal thought content      Assessment & Plan:   Problem List Items Addressed This Visit    Acute bacterial sinusitis - Primary    Treat for bacterial cause given prolonged symptoms. Reviewed supportive care for conjunctivitis. Recommend avoid pseudophed for now given mildly elevated BP noted. Ok to continue ibuprofen and zyrtec. Update if not improving with treatment. Diflucan WASP if develops yeast infection after abx. Pt agrees with plan.       Relevant Medications   amoxicillin-clavulanate (AUGMENTIN) 875-125 MG tablet   fluconazole (DIFLUCAN) 150  MG tablet       Meds ordered this encounter  Medications  . amoxicillin-clavulanate (AUGMENTIN) 875-125 MG tablet    Sig: Take 1 tablet by mouth 2 (two) times daily for 10 days.    Dispense:  20 tablet    Refill:  0  . fluconazole (DIFLUCAN) 150 MG tablet    Sig: Take 1 tablet (150 mg total) by mouth once for 1 dose.    Dispense:  1 tablet    Refill:  0   No orders of the defined types were placed in this encounter.   I discussed the assessment and treatment plan with the patient. The patient was provided an opportunity to ask questions and all were answered. The patient agreed with the plan and demonstrated an understanding of the instructions. The patient was advised to call back or seek an in-person evaluation if the symptoms worsen or if the condition fails to improve as anticipated.  Follow up plan: No follow-ups on file.  Eustaquio BoydenJavier Jacen Carlini, MD

## 2018-12-07 NOTE — Assessment & Plan Note (Addendum)
Treat for bacterial cause given prolonged symptoms. Reviewed supportive care for conjunctivitis. Recommend avoid pseudophed for now given mildly elevated BP noted. Ok to continue ibuprofen and zyrtec. Update if not improving with treatment. Diflucan WASP if develops yeast infection after abx. Pt agrees with plan.

## 2018-12-15 ENCOUNTER — Telehealth: Payer: Self-pay | Admitting: Family Medicine

## 2018-12-15 DIAGNOSIS — E7800 Pure hypercholesterolemia, unspecified: Secondary | ICD-10-CM

## 2018-12-15 DIAGNOSIS — Z0000 Encounter for general adult medical examination without abnormal findings: Secondary | ICD-10-CM

## 2018-12-15 NOTE — Telephone Encounter (Signed)
-----   Message from Ellamae Sia sent at 12/05/2018  4:04 PM EDT ----- Regarding: Lab orders for Friday, 8.14.20 Patient is scheduled for CPX labs, please order future labs, Thanks , Karna Christmas

## 2018-12-16 ENCOUNTER — Other Ambulatory Visit: Payer: Medicaid Other

## 2018-12-16 ENCOUNTER — Other Ambulatory Visit (INDEPENDENT_AMBULATORY_CARE_PROVIDER_SITE_OTHER): Payer: Self-pay

## 2018-12-16 ENCOUNTER — Other Ambulatory Visit: Payer: Self-pay

## 2018-12-16 DIAGNOSIS — Z0000 Encounter for general adult medical examination without abnormal findings: Secondary | ICD-10-CM

## 2018-12-16 DIAGNOSIS — E7800 Pure hypercholesterolemia, unspecified: Secondary | ICD-10-CM

## 2018-12-16 LAB — CBC WITH DIFFERENTIAL/PLATELET
Basophils Absolute: 0.1 10*3/uL (ref 0.0–0.1)
Basophils Relative: 0.6 % (ref 0.0–3.0)
Eosinophils Absolute: 0.1 10*3/uL (ref 0.0–0.7)
Eosinophils Relative: 1.4 % (ref 0.0–5.0)
HCT: 37.2 % (ref 36.0–46.0)
Hemoglobin: 12.5 g/dL (ref 12.0–15.0)
Lymphocytes Relative: 28.9 % (ref 12.0–46.0)
Lymphs Abs: 2.7 10*3/uL (ref 0.7–4.0)
MCHC: 33.6 g/dL (ref 30.0–36.0)
MCV: 82 fl (ref 78.0–100.0)
Monocytes Absolute: 0.5 10*3/uL (ref 0.1–1.0)
Monocytes Relative: 5.3 % (ref 3.0–12.0)
Neutro Abs: 6 10*3/uL (ref 1.4–7.7)
Neutrophils Relative %: 63.8 % (ref 43.0–77.0)
Platelets: 436 10*3/uL — ABNORMAL HIGH (ref 150.0–400.0)
RBC: 4.53 Mil/uL (ref 3.87–5.11)
RDW: 13.3 % (ref 11.5–15.5)
WBC: 9.4 10*3/uL (ref 4.0–10.5)

## 2018-12-16 LAB — COMPREHENSIVE METABOLIC PANEL
ALT: 19 U/L (ref 0–35)
AST: 15 U/L (ref 0–37)
Albumin: 3.8 g/dL (ref 3.5–5.2)
Alkaline Phosphatase: 92 U/L (ref 39–117)
BUN: 7 mg/dL (ref 6–23)
CO2: 25 mEq/L (ref 19–32)
Calcium: 9.4 mg/dL (ref 8.4–10.5)
Chloride: 101 mEq/L (ref 96–112)
Creatinine, Ser: 0.77 mg/dL (ref 0.40–1.20)
GFR: 87.38 mL/min (ref >60.00)
Glucose, Bld: 91 mg/dL (ref 70–99)
Potassium: 4.5 mEq/L (ref 3.5–5.1)
Sodium: 134 mEq/L — ABNORMAL LOW (ref 135–145)
Total Bilirubin: 0.3 mg/dL (ref 0.2–1.2)
Total Protein: 7.6 g/dL (ref 6.0–8.3)

## 2018-12-16 LAB — LIPID PANEL
Cholesterol: 222 mg/dL — ABNORMAL HIGH (ref 0–200)
HDL: 35.7 mg/dL — ABNORMAL LOW (ref >39.00)
LDL Cholesterol: 154 mg/dL — ABNORMAL HIGH (ref 0–99)
NonHDL: 185.8
Total CHOL/HDL Ratio: 6
Triglycerides: 160 mg/dL — ABNORMAL HIGH (ref 0.0–149.0)
VLDL: 32 mg/dL (ref 0.0–40.0)

## 2018-12-16 LAB — TSH: TSH: 2.28 u[IU]/mL (ref 0.35–4.50)

## 2018-12-20 ENCOUNTER — Ambulatory Visit (INDEPENDENT_AMBULATORY_CARE_PROVIDER_SITE_OTHER): Payer: Self-pay | Admitting: Family Medicine

## 2018-12-20 ENCOUNTER — Encounter: Payer: Self-pay | Admitting: Family Medicine

## 2018-12-20 VITALS — Wt 220.0 lb

## 2018-12-20 DIAGNOSIS — E7800 Pure hypercholesterolemia, unspecified: Secondary | ICD-10-CM

## 2018-12-20 DIAGNOSIS — F418 Other specified anxiety disorders: Secondary | ICD-10-CM

## 2018-12-20 DIAGNOSIS — Z0000 Encounter for general adult medical examination without abnormal findings: Secondary | ICD-10-CM

## 2018-12-20 DIAGNOSIS — Z30011 Encounter for initial prescription of contraceptive pills: Secondary | ICD-10-CM

## 2018-12-20 DIAGNOSIS — R0981 Nasal congestion: Secondary | ICD-10-CM

## 2018-12-20 MED ORDER — ATORVASTATIN CALCIUM 20 MG PO TABS: 20.0000 mg | ORAL_TABLET | Freq: Every day | ORAL | 3 refills | Status: DC

## 2018-12-20 MED ORDER — NORETHIN ACE-ETH ESTRAD-FE 1-20 MG-MCG PO TABS: 1.0000 | ORAL_TABLET | Freq: Every day | ORAL | 3 refills | Status: DC

## 2018-12-20 MED ORDER — FLUTICASONE PROPIONATE 50 MCG/ACT NA SUSP: 2.0000 | Freq: Every day | NASAL | 6 refills | Status: AC

## 2018-12-20 NOTE — Patient Instructions (Addendum)
Check your blood pressure at work - and let us know what it is   Get your flu shot in the fall and let us know the date so we can put it in your chart   Our office will call you to set up a 3 month fasting lab for cholesterol Take the atorvastatin daily (alert [place] if any problems)   Stay active Try flonase for congestion

## 2018-12-20 NOTE — Progress Notes (Signed)
Virtual Visit via Video Note  I connected with Yvonne Trujillo on 12/20/18 at  2:30 PM EDT by a video enabled telemedicine application and verified that I am speaking with the correct person using two identifiers.  Location: Patient: in her car at work  Provider: office    I discussed the limitations of evaluation and management by telemedicine and the availability of in person appointments. The patient expressed understanding and agreed to proceed.  History of Present Illness: Here for health maintenance exam and to review chronic medical problems    Baby is well / often keeps her up at night   Flu vaccine -gets them in the fall  Gets them at work ([facility])   Had a recent sinus infection  Her R nostril stays blocked up  Taking zyrtec (tried claritin and allegra)     Pap 2/19 -neg  Had a baby this year  OC- junel fe Periods are back to normal -regular /not too heavy or painful  Non smoker  No plans to get pregnant yet   Tdap 6/19   HIV screen neg 6/19    Weight 220 lb wellbutrin has helped with appetite-not eating as much  Wt Readings from Last 3 Encounters:  12/20/18 220 lb (99.8 kg)  12/07/18 220 lb (99.8 kg)  02/15/18 207 lb (93.9 kg)  she eats fairly healthy-better with eating at home  No exercise -with her schedule  41.57 kg/m    Last visit-disc depression with anx  Px wellbutrin xl -thinks it is working  Wants to stay on that dose   BP Readings from Last 3 Encounters:  12/07/18 (!) 132/95  02/15/18 110/78  01/21/18 127/85  last bp was a little high-had a sinus infection  Now really watching sodium in her diet and less sodas! Doing better  Drinks a lot of water    Hyperlipidemia Lab Results  Component Value Date   CHOL 222 (H) 12/16/2018   CHOL 203 (H) 02/28/2016   CHOL 207 (H) 01/18/2015   Lab Results  Component Value Date   HDL 35.70 (L) 12/16/2018   HDL 51.80 02/28/2016   HDL 47.80 01/18/2015   Lab Results  Component Value  Date   LDLCALC 154 (H) 12/16/2018   LDLCALC 121 (H) 02/28/2016   LDLCALC 127 (H) 01/18/2015   Lab Results  Component Value Date   TRIG 160.0 (H) 12/16/2018   TRIG 151.0 (H) 02/28/2016   TRIG 163.0 (H) 01/18/2015   Lab Results  Component Value Date   CHOLHDL 6 12/16/2018   CHOLHDL 4 02/28/2016   CHOLHDL 4 01/18/2015   Lab Results  Component Value Date   LDLDIRECT 223.2 04/24/2013   Diet  Was on atorvastatin at one time  Ready to go back on it    Her mother had cabg at 4638 ! (smoker)   Results for orders placed or performed in visit on 12/16/18  TSH  Result Value Ref Range   TSH 2.28 0.35 - 4.50 uIU/mL  Lipid panel  Result Value Ref Range   Cholesterol 222 (H) 0 - 200 mg/dL   Triglycerides 161.0160.0 (H) 0.0 - 149.0 mg/dL   HDL 96.0435.70 (L) >54.09>39.00 mg/dL   VLDL 81.132.0 0.0 - 91.440.0 mg/dL   LDL Cholesterol 782154 (H) 0 - 99 mg/dL   Total CHOL/HDL Ratio 6    NonHDL 185.80   CBC with Differential/Platelet  Result Value Ref Range   WBC 9.4 4.0 - 10.5 K/uL   RBC 4.53 3.87 -  5.11 Mil/uL   Hemoglobin 12.5 12.0 - 15.0 g/dL   HCT 37.2 36.0 - 46.0 %   MCV 82.0 78.0 - 100.0 fl   MCHC 33.6 30.0 - 36.0 g/dL   RDW 13.3 11.5 - 15.5 %   Platelets 436.0 (H) 150.0 - 400.0 K/uL   Neutrophils Relative % 63.8 43.0 - 77.0 %   Lymphocytes Relative 28.9 12.0 - 46.0 %   Monocytes Relative 5.3 3.0 - 12.0 %   Eosinophils Relative 1.4 0.0 - 5.0 %   Basophils Relative 0.6 0.0 - 3.0 %   Neutro Abs 6.0 1.4 - 7.7 K/uL   Lymphs Abs 2.7 0.7 - 4.0 K/uL   Monocytes Absolute 0.5 0.1 - 1.0 K/uL   Eosinophils Absolute 0.1 0.0 - 0.7 K/uL   Basophils Absolute 0.1 0.0 - 0.1 K/uL  Comprehensive metabolic panel  Result Value Ref Range   Sodium 134 (L) 135 - 145 mEq/L   Potassium 4.5 3.5 - 5.1 mEq/L   Chloride 101 96 - 112 mEq/L   CO2 25 19 - 32 mEq/L   Glucose, Bld 91 70 - 99 mg/dL   BUN 7 6 - 23 mg/dL   Creatinine, Ser 0.77 0.40 - 1.20 mg/dL   Total Bilirubin 0.3 0.2 - 1.2 mg/dL   Alkaline Phosphatase 92 39  - 117 U/L   AST 15 0 - 37 U/L   ALT 19 0 - 35 U/L   Total Protein 7.6 6.0 - 8.3 g/dL   Albumin 3.8 3.5 - 5.2 g/dL   Calcium 9.4 8.4 - 10.5 mg/dL   GFR 87.38 >60.00 mL/min   Big water drinker   Patient Active Problem List   Diagnosis Date Noted  . Nasal congestion 12/20/2018  . History of gestational hypertension 01/15/2018  . Herpes simplex 01/18/2015  . Hyperlipidemia 04/25/2013  . Migraine 04/24/2013  . Depression with anxiety 04/24/2013  . Hirsutism 04/24/2013  . Acne 04/24/2013  . History of kidney stones 04/24/2013  . Routine general medical examination at a health care facility 04/24/2013   Past Medical History:  Diagnosis Date  . Asthma   . Concussion   . History of depression   . Kidney infection   . Kidney stone   . Migraine   . Supervision of normal first pregnancy, antepartum 06/23/2017    Clinic  [facility] Prenatal Labs Dating  Blood type:    Genetic Screen 1 Screen:    AFP:     Quad:     NIPS: Antibody:  Anatomic [place]  Rubella:   GTT Early:               Third trimester:  RPR:    Flu vaccine  HBsAg:    TDaP vaccine                                               Rhogam: HIV:   Baby Food        Breast                                        GBS: (For PCN allergy, check sensitivities) Cont   Past Surgical History:  Procedure Laterality Date  . APPENDECTOMY    . EXTRACORPOREAL SHOCK WAVE LITHOTRIPSY Left 07/09/2016   Procedure: EXTRACORPOREAL  SHOCK WAVE LITHOTRIPSY (ESWL);  Surgeon: Vanna ScotlandAshley Brandon, MD;  Location: [facility] ORS;  Service: Urology;  Laterality: Left;   Social History   Tobacco Use  . Smoking status: Former Smoker    Years: 6.00    Types: Cigarettes  . Smokeless tobacco: Never Used  Substance Use Topics  . Alcohol use: Yes    Alcohol/week: 0.0 standard drinks    Comment: rare  . Drug use: Not Currently   Family History  Problem Relation Age of Onset  . Alcohol abuse Maternal Uncle   . Hyperlipidemia Mother   . Hypertension Mother   . Heart disease  Mother   . Hyperlipidemia Father   . Hypertension Father   . Diabetes Cousin   . Bladder Cancer Neg Hx   . Kidney cancer Neg Hx    No Known Allergies Current Outpatient Medications on File Prior to Visit  Medication Sig Dispense Refill  . albuterol (VENTOLIN HFA) 108 (90 Base) MCG/ACT inhaler Inhale 2 puffs into the lungs every 4 (four) hours as needed. 1 Inhaler 3  . ALPRAZolam (XANAX) 0.5 MG tablet Take 1 tablet (0.5 mg total) by mouth 2 (two) times daily as needed for anxiety. 30 tablet 0  . buPROPion (WELLBUTRIN XL) 150 MG 24 hr tablet Take 1 tablet (150 mg total) by mouth daily. 90 tablet 3  . ondansetron (ZOFRAN) 4 MG tablet Take 1 tablet (4 mg total) by mouth every 8 (eight) hours as needed for nausea or vomiting. 30 tablet 3  . SUMAtriptan (IMITREX) 100 MG tablet Take 1 tablet (100 mg total) by mouth every 2 (two) hours as needed for migraine. May repeat in 2 hours if headache persists or recurs. 10 tablet 5  . valACYclovir (VALTREX) 1000 MG tablet Take 2 pills by mouth twice daily for 1 day for a cold sore 4 tablet 3   No current facility-administered medications on file prior to visit.    Review of Systems  Constitutional: Negative for chills, fever and malaise/fatigue.  HENT: Positive for congestion. Negative for sinus pain and sore throat.   Eyes: Negative for blurred vision, discharge and redness.  Respiratory: Negative for cough and shortness of breath.   Cardiovascular: Negative for chest pain and palpitations.  Gastrointestinal: Negative for heartburn, nausea and vomiting.  Musculoskeletal: Negative for myalgias.  Skin: Negative for rash.  Neurological: Negative for dizziness and headaches.  Endo/Heme/Allergies: Negative for polydipsia.  Psychiatric/Behavioral: Negative for depression. The patient is not nervous/anxious.        Depression and anxiety are improved       Observations/Objective: Patient appears well, in no distress Weight is baseline (obese) No facial  swelling or asymmetry Normal voice-not hoarse and no slurred speech No obvious tremor or mobility impairment Moving neck and UEs normally Able to hear the call well  No cough or shortness of breath during interview  Talkative and mentally sharp with no cognitive changes No skin changes on face or neck , no rash or pallor Affect is normal    Assessment and Plan: Problem List Items Addressed This Visit      Other   Depression with anxiety    Doing much better with wellbutrin  Will stay with current dose Reviewed stressors/ coping techniques/symptoms/ support sources/ tx options and side effects in detail today Keeping up with an almost 33 year old-not a lot of time for self care (but enjoying that)      Routine general medical examination at a health care facility - Primary  Reviewed health habits including diet and exercise and skin cancer prevention Reviewed appropriate screening tests for age  Also reviewed health mt list, fam hx and immunization status , as well as social and family history   See HPI Labs rev (will start on stain for cholesterol)  Enc exercise when she can  Mood is better  Pap utd-will change gyn care back to me  Refilled OC (no plans for pregnancy)  Will get a flu shot at work soon  Last bp here was up-so she will check it at her pharmacy/work and call in result (no recent bp problems)         Hyperlipidemia    LDL is up in setting of strong fam hx of CAD Will re start atorvastatin 20 mg daily  Disc poss side eff-will update  Disc goals for lipids and reasons to control them Rev last labs with pt Rev low sat fat diet in detail Plan to re check in 3 mo       Relevant Medications   atorvastatin (LIPITOR) 20 MG tablet   Other Relevant Orders   Lipid panel   ALT   AST   Nasal congestion    Chronic-worse in R nostril Will try flonase ns 2 sp per nostril QD Update if not starting to improve in 1-2 week or if worsening        Morbid obesity  (HCC)    BMI of 41.5 Discussed how this problem influences overall health and the risks it imposes  Reviewed plan for weight loss with lower calorie diet (via better food choices and also portion control or program like weight watchers) and exercise building up to or more than 30 minutes 5 days per week including some aerobic activity   Is starting to loose weight with wellbutrin and better diet  Hard to fit in exercise with work and new baby       Other Visit Diagnoses    Encounter for initial prescription of contraceptive pills       Relevant Medications   norethindrone-ethinyl estradiol (JUNEL FE 1/20) 1-20 MG-MCG tablet       Follow Up Instructions: Check your blood pressure at work - and let us know what it is   Get your flu shot in the fall and let us know the date so we can put it in your chart   Our office will call you to set up a 3 month fasting lab for cholesterol Take the atorvastatin daily (alert us if any problems)   Stay active Try flonase for congestion    I discussed the assessment and treatment plan with the patient. The patient was provided an opportunity to ask questions and all were answered. The patient agreed with the plan and demonstrated an understanding of the instructions.   The patient was advised to call back or seek an in-person evaluation if the symptoms worsen or if the condition fails to improve as anticipated.     Roxy MannsMarne Misty Rago, MD

## 2018-12-20 NOTE — Assessment & Plan Note (Signed)
BMI of 41.5 Discussed how this problem influences overall health and the risks it imposes  Reviewed plan for weight loss with lower calorie diet (via better food choices and also portion control or program like weight watchers) and exercise building up to or more than 30 minutes 5 days per week including some aerobic activity   Is starting to loose weight with wellbutrin and better diet  Hard to fit in exercise with work and new baby

## 2018-12-20 NOTE — Assessment & Plan Note (Signed)
Doing much better with wellbutrin  Will stay with current dose Reviewed stressors/ coping techniques/symptoms/ support sources/ tx options and side effects in detail today Keeping up with an almost 33 year old-not a lot of time for self care (but enjoying that)

## 2018-12-20 NOTE — Assessment & Plan Note (Signed)
Reviewed health habits including diet and exercise and skin cancer prevention Reviewed appropriate screening tests for age  Also reviewed health mt list, fam hx and immunization status , as well as social and family history   See HPI Labs rev (will start on stain for cholesterol)  Enc exercise when she can  Mood is better  Pap utd-will change gyn care back to me  Refilled OC (no plans for pregnancy)  Will get a flu shot at work soon  Last bp here was up-so she will check it at her pharmacy/work and call in result (no recent bp problems)

## 2018-12-20 NOTE — Assessment & Plan Note (Signed)
LDL is up in setting of strong fam hx of CAD Will re start atorvastatin 20 mg daily  Disc poss side eff-will update  Disc goals for lipids and reasons to control them Rev last labs with pt Rev low sat fat diet in detail Plan to re check in 3 mo

## 2018-12-20 NOTE — Assessment & Plan Note (Signed)
Chronic-worse in R nostril Will try flonase ns 2 sp per nostril QD Update if not starting to improve in 1-2 week or if worsening

## 2018-12-22 ENCOUNTER — Encounter: Payer: Self-pay | Admitting: Family Medicine

## 2018-12-23 MED ORDER — AMOXICILLIN-POT CLAVULANATE 875-125 MG PO TABS: 1.0000 | ORAL_TABLET | Freq: Two times a day (BID) | ORAL | 0 refills | Status: DC

## 2019-03-20 ENCOUNTER — Telehealth: Payer: Self-pay

## 2019-03-20 NOTE — Telephone Encounter (Signed)
LVM to call clinic, needs COVID screen and back lab info 11.16.2020 TLJ

## 2019-03-21 ENCOUNTER — Telehealth: Payer: Self-pay | Admitting: Family Medicine

## 2019-03-21 DIAGNOSIS — Z0000 Encounter for general adult medical examination without abnormal findings: Secondary | ICD-10-CM

## 2019-03-21 DIAGNOSIS — E7800 Pure hypercholesterolemia, unspecified: Secondary | ICD-10-CM

## 2019-03-21 NOTE — Telephone Encounter (Signed)
-----   Message from Ellamae Sia sent at 03/14/2019 10:46 AM EST ----- Regarding: Lab orders for Wednesday, 11.18.20 Patient is scheduled for CPX labs, please order future labs, Thanks , Karna Christmas

## 2019-03-22 ENCOUNTER — Other Ambulatory Visit (INDEPENDENT_AMBULATORY_CARE_PROVIDER_SITE_OTHER): Payer: Self-pay

## 2019-03-22 ENCOUNTER — Other Ambulatory Visit: Payer: Self-pay

## 2019-03-22 DIAGNOSIS — Z0000 Encounter for general adult medical examination without abnormal findings: Secondary | ICD-10-CM

## 2019-03-22 DIAGNOSIS — E7800 Pure hypercholesterolemia, unspecified: Secondary | ICD-10-CM

## 2019-03-22 LAB — CBC WITH DIFFERENTIAL/PLATELET
Basophils Absolute: 0.1 10*3/uL (ref 0.0–0.1)
Basophils Relative: 0.6 % (ref 0.0–3.0)
Eosinophils Absolute: 0.1 10*3/uL (ref 0.0–0.7)
Eosinophils Relative: 1.5 % (ref 0.0–5.0)
HCT: 39.6 % (ref 36.0–46.0)
Hemoglobin: 13.2 g/dL (ref 12.0–15.0)
Lymphocytes Relative: 25.4 % (ref 12.0–46.0)
Lymphs Abs: 2.5 10*3/uL (ref 0.7–4.0)
MCHC: 33.3 g/dL (ref 30.0–36.0)
MCV: 84.3 fl (ref 78.0–100.0)
Monocytes Absolute: 0.6 10*3/uL (ref 0.1–1.0)
Monocytes Relative: 6 % (ref 3.0–12.0)
Neutro Abs: 6.4 10*3/uL (ref 1.4–7.7)
Neutrophils Relative %: 66.5 % (ref 43.0–77.0)
Platelets: 450 10*3/uL — ABNORMAL HIGH (ref 150.0–400.0)
RBC: 4.69 Mil/uL (ref 3.87–5.11)
RDW: 13.2 % (ref 11.5–15.5)
WBC: 9.7 10*3/uL (ref 4.0–10.5)

## 2019-03-22 LAB — COMPREHENSIVE METABOLIC PANEL
ALT: 11 U/L (ref 0–35)
AST: 13 U/L (ref 0–37)
Albumin: 3.8 g/dL (ref 3.5–5.2)
Alkaline Phosphatase: 88 U/L (ref 39–117)
BUN: 13 mg/dL (ref 6–23)
CO2: 25 mEq/L (ref 19–32)
Calcium: 9.2 mg/dL (ref 8.4–10.5)
Chloride: 103 mEq/L (ref 96–112)
Creatinine, Ser: 0.88 mg/dL (ref 0.40–1.20)
GFR: 74.78 mL/min (ref >60.00)
Glucose, Bld: 83 mg/dL (ref 70–99)
Potassium: 4.2 mEq/L (ref 3.5–5.1)
Sodium: 137 mEq/L (ref 135–145)
Total Bilirubin: 0.4 mg/dL (ref 0.2–1.2)
Total Protein: 7.8 g/dL (ref 6.0–8.3)

## 2019-03-22 LAB — LIPID PANEL
Cholesterol: 162 mg/dL (ref 0–200)
HDL: 36 mg/dL — ABNORMAL LOW (ref >39.00)
LDL Cholesterol: 103 mg/dL — ABNORMAL HIGH (ref 0–99)
NonHDL: 126.36
Total CHOL/HDL Ratio: 5
Triglycerides: 119 mg/dL (ref 0.0–149.0)
VLDL: 23.8 mg/dL (ref 0.0–40.0)

## 2019-03-22 LAB — TSH: TSH: 1.95 u[IU]/mL (ref 0.35–4.50)

## 2019-06-16 ENCOUNTER — Other Ambulatory Visit: Payer: Self-pay | Admitting: Family Medicine

## 2019-06-16 NOTE — Telephone Encounter (Signed)
Name of Medication: xanax Name of Pharmacy: [facility] Last Fill or Written Date and Quantity: 09/23/18 #30 with 0 refills Last Office Visit and Type: CPE on 12/20/18 Next Office Visit and Type: none scheduled

## 2019-07-26 ENCOUNTER — Encounter: Payer: Self-pay | Admitting: Family Medicine

## 2019-07-26 ENCOUNTER — Other Ambulatory Visit: Payer: Self-pay

## 2019-07-26 ENCOUNTER — Ambulatory Visit (INDEPENDENT_AMBULATORY_CARE_PROVIDER_SITE_OTHER): Payer: Self-pay | Admitting: Family Medicine

## 2019-07-26 DIAGNOSIS — J208 Acute bronchitis due to other specified organisms: Secondary | ICD-10-CM | POA: Insufficient documentation

## 2019-07-26 MED ORDER — HYDROCODONE-HOMATROPINE 5-1.5 MG/5ML PO SYRP: 5.0000 mL | ORAL_SOLUTION | Freq: Three times a day (TID) | ORAL | 0 refills | Status: DC | PRN

## 2019-07-26 MED ORDER — PREDNISONE 10 MG PO TABS: ORAL_TABLET | ORAL | 0 refills | Status: DC

## 2019-07-26 NOTE — Patient Instructions (Signed)
I think you have a viral bronchitis  Take prednisone as directed for cough and tight chest  Hycodan for cough when not driving/working (may sedate) Lozenges or ok  Lots of fluids and rest when you can   Update if not starting to improve in a week or if worsening   Also if any new symptoms or if cough becomes productive

## 2019-07-26 NOTE — Assessment & Plan Note (Signed)
With neg covid test in covid immunized pt who used to smoke  Dry / tight cough /occ wheeze inst to continue albuterol mdi prn Px prednisone 30 mg taper Also hycodan for cough-warning of sedation  Fluids/rest  inst to call if any new symptoms  Also if cough becomes productive or any fever  Update if not starting to improve in a week or if worsening

## 2019-07-26 NOTE — Progress Notes (Signed)
Virtual Visit via Video Note  I connected with Yvonne Trujillo on 07/26/19 at  9:00 AM EDT by a video enabled telemedicine application and verified that I am speaking with the correct person using two identifiers.  Location: Patient: work Education administrator) Provider: office   I discussed the limitations of evaluation and management by telemedicine and the availability of in person appointments. The patient expressed understanding and agreed to proceed.  Parties involved Patient Yvonne Trujillo Physician: Roxy Manns   History of Present Illness: Cough for 2 weeks  covid test neg She has had both vaccines also   Son had a cold and gave it to her  Cough remains - very deep  Albuterol inhaler  Humidifier  vics  Delsym- did not like side effects and did not help tremendously  Lots of cough drops   Cough is dry-not productive at all  Worsens when warm or active  It is wheezy and tight and very deep  Chest is a little sore   No head/nasal symptoms at all  No ST or fever No loss of taste or smell   Tessalon does not work for her   Former smoker   Ok with hydrocodone - has to take at night     Patient Active Problem List   Diagnosis Date Noted  . Acute viral bronchitis 07/26/2019  . Nasal congestion 12/20/2018  . Morbid obesity (HCC) 12/20/2018  . History of gestational hypertension 01/15/2018  . Herpes simplex 01/18/2015  . Hyperlipidemia 04/25/2013  . Migraine 04/24/2013  . Depression with anxiety 04/24/2013  . Hirsutism 04/24/2013  . Acne 04/24/2013  . History of kidney stones 04/24/2013  . Routine general medical examination at a health care facility 04/24/2013   Past Medical History:  Diagnosis Date  . Asthma   . Concussion   . History of depression   . Kidney infection   . Kidney stone   . Migraine   . Supervision of normal first pregnancy, antepartum 06/23/2017    Clinic  [facility] Prenatal Labs Dating  Blood type:    Genetic Screen 1 Screen:    AFP:     Quad:      NIPS: Antibody:  Anatomic [place]  Rubella:   GTT Early:               Third trimester:  RPR:    Flu vaccine  HBsAg:    TDaP vaccine                                               Rhogam: HIV:   Baby Food        Breast                                        GBS: (For PCN allergy, check sensitivities) Cont   Past Surgical History:  Procedure Laterality Date  . APPENDECTOMY    . EXTRACORPOREAL SHOCK WAVE LITHOTRIPSY Left 07/09/2016   Procedure: EXTRACORPOREAL SHOCK WAVE LITHOTRIPSY (ESWL);  Surgeon: Vanna Scotland, MD;  Location: [facility] ORS;  Service: Urology;  Laterality: Left;   Social History   Tobacco Use  . Smoking status: Former Smoker    Years: 6.00    Types: Cigarettes  . Smokeless tobacco: Never Used  Substance Use Topics  .  Alcohol use: Yes    Alcohol/week: 0.0 standard drinks    Comment: rare  . Drug use: Not Currently   Family History  Problem Relation Age of Onset  . Alcohol abuse Maternal Uncle   . Hyperlipidemia Mother   . Hypertension Mother   . Heart disease Mother   . Hyperlipidemia Father   . Hypertension Father   . Diabetes Cousin   . Bladder Cancer Neg Hx   . Kidney cancer Neg Hx    Allergies  Allergen Reactions  . Delsym [Dextromethorphan]     Makes her feel bad   Current Outpatient Medications on File Prior to Visit  Medication Sig Dispense Refill  . albuterol (VENTOLIN HFA) 108 (90 Base) MCG/ACT inhaler Inhale 2 puffs into the lungs every 4 (four) hours as needed. 1 Inhaler 3  . ALPRAZolam (XANAX) 0.5 MG tablet TAKE ONE TABLET TWICE DAILY AS NEEDED FOR ANXIETY 30 tablet 0  . atorvastatin (LIPITOR) 20 MG tablet Take 1 tablet (20 mg total) by mouth daily. 90 tablet 3  . buPROPion (WELLBUTRIN XL) 150 MG 24 hr tablet Take 1 tablet (150 mg total) by mouth daily. 90 tablet 3  . fluticasone (FLONASE) 50 MCG/ACT nasal spray Place 2 sprays into both nostrils daily. 16 g 6  . norethindrone-ethinyl estradiol (JUNEL FE 1/20) 1-20 MG-MCG tablet Take 1 tablet by mouth  daily. 3 Package 3  . ondansetron (ZOFRAN) 4 MG tablet Take 1 tablet (4 mg total) by mouth every 8 (eight) hours as needed for nausea or vomiting. 30 tablet 3  . SUMAtriptan (IMITREX) 100 MG tablet Take 1 tablet (100 mg total) by mouth every 2 (two) hours as needed for migraine. May repeat in 2 hours if headache persists or recurs. 10 tablet 5  . valACYclovir (VALTREX) 1000 MG tablet Take 2 pills by mouth twice daily for 1 day for a cold sore 4 tablet 3   No current facility-administered medications on file prior to visit.   Review of Systems  Constitutional: Positive for malaise/fatigue. Negative for chills and fever.  HENT: Negative for congestion, ear pain, sinus pain and sore throat.   Eyes: Negative for blurred vision, discharge and redness.  Respiratory: Positive for cough and wheezing. Negative for sputum production, shortness of breath and stridor.   Cardiovascular: Negative for chest pain, palpitations and leg swelling.  Gastrointestinal: Negative for abdominal pain, diarrhea, nausea and vomiting.  Musculoskeletal: Negative for myalgias.  Skin: Negative for rash.  Neurological: Negative for dizziness and headaches.    Observations/Objective: Patient appears well, in no distress Weight is baseline  No facial swelling or asymmetry Voice is very slightly hoarse  No obvious tremor or mobility impairment Moving neck and UEs normally Able to hear the call well  No cough or shortness of breath during interview (also no audible wheeze on forced expiration)  Talkative and mentally sharp with no cognitive changes No skin changes on face or neck , no rash or pallor Affect is normal    Assessment and Plan: Problem List Items Addressed This Visit      Respiratory   Acute viral bronchitis    With neg covid test in covid immunized pt who used to smoke  Dry / tight cough /occ wheeze inst to continue albuterol mdi prn Px prednisone 30 mg taper Also hycodan for cough-warning of  sedation  Fluids/rest  inst to call if any new symptoms  Also if cough becomes productive or any fever  Update if not starting to improve  in a week or if worsening            Follow Up Instructions: I think you have a viral bronchitis  Take prednisone as directed for cough and tight chest  Hycodan for cough when not driving/working (may sedate) Lozenges or ok  Lots of fluids and rest when you can   Update if not starting to improve in a week or if worsening   Also if any new symptoms or if cough becomes productive    I discussed the assessment and treatment plan with the patient. The patient was provided an opportunity to ask questions and all were answered. The patient agreed with the plan and demonstrated an understanding of the instructions.   The patient was advised to call back or seek an in-person evaluation if the symptoms worsen or if the condition fails to improve as anticipated.     Roxy Manns, MD

## 2019-08-01 ENCOUNTER — Encounter: Payer: Self-pay | Admitting: Family Medicine

## 2019-08-02 ENCOUNTER — Telehealth: Payer: Self-pay | Admitting: Family Medicine

## 2019-08-02 NOTE — Telephone Encounter (Signed)
Patient called today She stated she received the my chart message about Dr Yvonne Trujillo being out Patient stated that she has not improved and would like to know what we suggest she do.

## 2019-08-02 NOTE — Progress Notes (Signed)
Patient would like a call back. She would like to know if she should see someone else since Dr tower is out of office Please advise

## 2019-08-02 NOTE — Telephone Encounter (Signed)
Attempted to call patient in response to her MyChart message from earlier today with regards to her continued cough.  No answer.  Message sent to patient via MyChart regarding recommendations.

## 2019-08-07 ENCOUNTER — Encounter: Payer: Self-pay | Admitting: Family Medicine

## 2019-08-08 ENCOUNTER — Encounter: Payer: Self-pay | Admitting: Family Medicine

## 2019-08-09 ENCOUNTER — Other Ambulatory Visit: Payer: Self-pay

## 2019-08-09 ENCOUNTER — Ambulatory Visit (INDEPENDENT_AMBULATORY_CARE_PROVIDER_SITE_OTHER): Payer: Medicaid Other | Admitting: Family Medicine

## 2019-08-09 ENCOUNTER — Ambulatory Visit (INDEPENDENT_AMBULATORY_CARE_PROVIDER_SITE_OTHER): Payer: Medicaid Other

## 2019-08-09 VITALS — BP 100/78 | HR 84 | Temp 98.8°F | Resp 16 | Ht 61.0 in | Wt 212.0 lb

## 2019-08-09 DIAGNOSIS — R059 Cough, unspecified: Secondary | ICD-10-CM

## 2019-08-09 DIAGNOSIS — R05 Cough: Secondary | ICD-10-CM

## 2019-08-09 DIAGNOSIS — J209 Acute bronchitis, unspecified: Secondary | ICD-10-CM

## 2019-08-09 MED ORDER — PREDNISONE 50 MG PO TABS: ORAL_TABLET | ORAL | 0 refills | Status: DC

## 2019-08-09 MED ORDER — ALBUTEROL SULFATE HFA 108 (90 BASE) MCG/ACT IN AERS: 2.0000 | INHALATION_SPRAY | RESPIRATORY_TRACT | 3 refills | Status: AC | PRN

## 2019-08-09 MED ORDER — CEFDINIR 300 MG PO CAPS: 300.0000 mg | ORAL_CAPSULE | Freq: Two times a day (BID) | ORAL | 0 refills | Status: DC

## 2019-08-09 MED ORDER — FLUCONAZOLE 150 MG PO TABS: 150.0000 mg | ORAL_TABLET | Freq: Once | ORAL | 0 refills | Status: AC

## 2019-08-09 NOTE — Progress Notes (Signed)
Patient ID: Yvonne Trujillo, female    DOB: October 15, 1985, 34 y.o.   MRN: [id number]  PCP: Judy Pimple, MD  Chief Complaint  Patient presents with  . Cough    Subjective:  HPI Yvonne Trujillo is a 34 y.o. female presents to [facility] clinic for evaluation of symptoms related to 3 weeks of cough and chest tightness. Patient was initially treated with prednisone taper and hycodan cough syrup and continues of cough. Cough is triggered by excessive talking and walking long distances. She has occasionally used her albuterol inhaler. Endorses increase work of breathing. Feels she experiences this form of an illness at least once annually. She has received her COVID-19 vaccinations in January, 2/2. No concern for COVID-19. Denies nasal congestion, headache, and sore throat. Review of Systems Pertinent negatives listed in HPI  Patient Active Problem List   Diagnosis Date Noted  . Acute viral bronchitis 07/26/2019  . Nasal congestion 12/20/2018  . Morbid obesity (HCC) 12/20/2018  . History of gestational hypertension 01/15/2018  . Herpes simplex 01/18/2015  . Hyperlipidemia 04/25/2013  . Migraine 04/24/2013  . Depression with anxiety 04/24/2013  . Hirsutism 04/24/2013  . Acne 04/24/2013  . History of kidney stones 04/24/2013  . Routine general medical examination at a health care facility 04/24/2013      Prior to Admission medications   Medication Sig Start Date End Date Taking? Authorizing Provider  albuterol (VENTOLIN HFA) 108 (90 Base) MCG/ACT inhaler Inhale 2 puffs into the lungs every 4 (four) hours as needed. 09/23/18   Tower, Audrie Gallus, MD  ALPRAZolam Prudy Feeler) 0.5 MG tablet TAKE ONE TABLET TWICE DAILY AS NEEDED FOR ANXIETY 06/16/19   Tower, Audrie Gallus, MD  atorvastatin (LIPITOR) 20 MG tablet Take 1 tablet (20 mg total) by mouth daily. 12/20/18   Tower, Audrie Gallus, MD  buPROPion (WELLBUTRIN XL) 150 MG 24 hr tablet Take 1 tablet (150 mg total) by mouth daily. 11/02/18   Tower, Audrie Gallus, MD   fluticasone (FLONASE) 50 MCG/ACT nasal spray Place 2 sprays into both nostrils daily. 12/20/18   Tower, Audrie Gallus, MD  HYDROcodone-homatropine [facility]) 5-1.5 MG/5ML syrup Take 5 mLs by mouth every 8 (eight) hours as needed for cough. Caution of sedation 07/26/19   Tower, Audrie Gallus, MD  norethindrone-ethinyl estradiol (JUNEL FE 1/20) 1-20 MG-MCG tablet Take 1 tablet by mouth daily. 12/20/18   Tower, Audrie Gallus, MD  ondansetron (ZOFRAN) 4 MG tablet Take 1 tablet (4 mg total) by mouth every 8 (eight) hours as needed for nausea or vomiting. 09/23/18   Tower, Audrie Gallus, MD  predniSONE (DELTASONE) 10 MG tablet Take 3 pills once daily by mouth for 3 days, then 2 pills once daily for 3 days, then 1 pill once daily for 3 days and then stop 07/26/19   Tower, Audrie Gallus, MD  SUMAtriptan (IMITREX) 100 MG tablet Take 1 tablet (100 mg total) by mouth every 2 (two) hours as needed for migraine. May repeat in 2 hours if headache persists or recurs. 09/23/18   Tower, Audrie Gallus, MD  valACYclovir (VALTREX) 1000 MG tablet Take 2 pills by mouth twice daily for 1 day for a cold sore 09/23/18   Tower, Audrie Gallus, MD    Past Medical, Surgical Family and Social History reviewed and updated.    Objective:   Today's Vitals   08/09/19 1731  Weight: 212 lb (96.2 kg)  Height: 5\' 1"  (1.549 m)    Wt Readings from Last 3 Encounters:  08/09/19 212  lb (96.2 kg)  12/20/18 220 lb (99.8 kg)  12/07/18 220 lb (99.8 kg)     Physical Exam HENT:     Head: Normocephalic and atraumatic.  Cardiovascular:     Rate and Rhythm: Normal rate and regular rhythm.  Pulmonary:     Breath sounds: Decreased air movement present.  Skin:    General: Skin is warm.     Capillary Refill: Capillary refill takes less than 2 seconds.  Neurological:     General: No focal deficit present.     Mental Status: She is alert and oriented to person, place, and time.     GCS: GCS eye subscore is 4. GCS verbal subscore is 5. GCS motor subscore is 6.  Psychiatric:         Attention and Perception: Attention normal.        Mood and Affect: Mood normal.        Behavior: Behavior normal.      Assessment & Plan:  1. Acute bronchitis, unspecified organism -Will trial Cefdinir 300 mg BID x 10 days  -Prednisone 50 mg x 3 days with breakfast  - DG Chest Portable 2 Views  2. Cough -Recommended Benzonatate, patient declined. -Recommended purchase OTC Robitussin DM  - DG Chest Portable 2 Views   DG Chest Portable 2 Views  Result Date: 08/09/2019 CLINICAL DATA:  Cough for 3 weeks. History of asthma. EXAM: CHEST  2 VIEW PORTABLE COMPARISON:  05/18/2017 FINDINGS: Mild central bronchial thickening.The cardiomediastinal contours are normal. pulmonary vasculature is normal. No consolidation, pleural effusion, or pneumothorax. No acute osseous abnormalities are seen. IMPRESSION: Mild central bronchial thickening, can be seen with asthma or bronchitis. Electronically Signed   By: Keith Rake M.D.   On: 08/09/2019 18:07    -The patient was given clear instructions to go to ER or return to medical center if symptoms do not improve, worsen or new problems develop. The patient verbalized understanding.     Molli Barrows, FNP-C [facility], PRN Provider  [facility]. [place], [place] Clinic Phone: [phone] Clinic Fax: [phone] Clinic Hours: 5:30 pm -7:30 pm (Monday-Friday)

## 2019-08-09 NOTE — Patient Instructions (Signed)

## 2019-10-06 ENCOUNTER — Other Ambulatory Visit: Payer: Self-pay | Admitting: Family Medicine

## 2019-10-06 DIAGNOSIS — Z30011 Encounter for initial prescription of contraceptive pills: Secondary | ICD-10-CM

## 2019-11-24 ENCOUNTER — Other Ambulatory Visit: Payer: Self-pay | Admitting: Family Medicine

## 2019-12-22 ENCOUNTER — Other Ambulatory Visit: Payer: Self-pay | Admitting: Family Medicine

## 2019-12-22 MED ORDER — ATORVASTATIN CALCIUM 20 MG PO TABS: 20.0000 mg | ORAL_TABLET | Freq: Every day | ORAL | 0 refills | Status: DC

## 2019-12-22 NOTE — Telephone Encounter (Signed)
Name of Medication: xanax Name of Pharmacy: [facility] Last Fill or Written Date and Quantity: 06/16/19 #30 with 0 refills Last Office Visit and Type: doxy cough on 07/26/19 (CPE on 12/20/18) Next Office Visit and Type: none scheduled

## 2019-12-22 NOTE — Addendum Note (Signed)
Addended by: Shon Millet on: 12/22/2019 04:33 PM   Modules accepted: Orders

## 2020-01-03 ENCOUNTER — Ambulatory Visit: Admit: 2020-01-03 | Disposition: A | Discharge status: Home / Self Care | Payer: Medicaid Other

## 2020-01-03 ENCOUNTER — Emergency Department
Admission: EM | Admit: 2020-01-03 | Discharge: 2020-01-03 | Disposition: A | Discharge status: Home / Self Care | Payer: No Typology Code available for payment source | Attending: Emergency Medicine | Admitting: Emergency Medicine

## 2020-01-03 ENCOUNTER — Other Ambulatory Visit: Payer: Self-pay

## 2020-01-03 ENCOUNTER — Encounter: Payer: Self-pay | Admitting: *Deleted

## 2020-01-03 DIAGNOSIS — M549 Dorsalgia, unspecified: Secondary | ICD-10-CM | POA: Insufficient documentation

## 2020-01-03 DIAGNOSIS — R109 Unspecified abdominal pain: Secondary | ICD-10-CM | POA: Insufficient documentation

## 2020-01-03 DIAGNOSIS — Z5321 Procedure and treatment not carried out due to patient leaving prior to being seen by health care provider: Secondary | ICD-10-CM | POA: Insufficient documentation

## 2020-01-03 DIAGNOSIS — R110 Nausea: Secondary | ICD-10-CM | POA: Insufficient documentation

## 2020-01-03 LAB — CBC
HCT: 40.4 % (ref 36.0–46.0)
Hemoglobin: 13.6 g/dL (ref 12.0–15.0)
MCH: 28.8 pg (ref 26.0–34.0)
MCHC: 33.7 g/dL (ref 30.0–36.0)
MCV: 85.4 fL (ref 80.0–100.0)
Platelets: 385 10*3/uL (ref 150–400)
RBC: 4.73 MIL/uL (ref 3.87–5.11)
RDW: 11.9 % (ref 11.5–15.5)
WBC: 13 10*3/uL — ABNORMAL HIGH (ref 4.0–10.5)
nRBC: 0 % (ref 0.0–0.2)

## 2020-01-03 LAB — URINALYSIS, COMPLETE (UACMP) WITH MICROSCOPIC
Bilirubin Urine: NEGATIVE
Glucose, UA: NEGATIVE mg/dL
Ketones, ur: NEGATIVE mg/dL
Leukocytes,Ua: NEGATIVE
Nitrite: NEGATIVE
Protein, ur: NEGATIVE mg/dL
Specific Gravity, Urine: 1.018 (ref 1.005–1.030)
pH: 5 (ref 5.0–8.0)

## 2020-01-03 LAB — COMPREHENSIVE METABOLIC PANEL
ALT: 13 U/L (ref 0–44)
AST: 15 U/L (ref 15–41)
Albumin: 3.5 g/dL (ref 3.5–5.0)
Alkaline Phosphatase: 82 U/L (ref 38–126)
Anion gap: 8 (ref 5–15)
BUN: 10 mg/dL (ref 6–20)
CO2: 25 mmol/L (ref 22–32)
Calcium: 8.9 mg/dL (ref 8.9–10.3)
Chloride: 101 mmol/L (ref 98–111)
Creatinine, Ser: 0.87 mg/dL (ref 0.44–1.00)
GFR calc Af Amer: >60 mL/min (ref >60)
GFR calc non Af Amer: >60 mL/min (ref >60)
Glucose, Bld: 107 mg/dL — ABNORMAL HIGH (ref 70–99)
Potassium: 4.3 mmol/L (ref 3.5–5.1)
Sodium: 134 mmol/L — ABNORMAL LOW (ref 135–145)
Total Bilirubin: 0.5 mg/dL (ref 0.3–1.2)
Total Protein: 7.9 g/dL (ref 6.5–8.1)

## 2020-01-03 LAB — LIPASE, BLOOD: Lipase: 23 U/L (ref 11–51)

## 2020-01-03 NOTE — ED Notes (Signed)
Pt called by RN Amy with no answer x1

## 2020-01-03 NOTE — ED Triage Notes (Signed)
Pt has upper abd pain and back pain.  Pt has nausea.  No v/d.  No urinary sx.  No vag bleeding.  Pt took tums with some relief.  Pt alert.

## 2020-01-03 NOTE — ED Notes (Signed)
Pt called by this RN x 2 with no answer.

## 2020-01-04 ENCOUNTER — Ambulatory Visit
Admission: RE | Admit: 2020-01-04 | Discharge: 2020-01-04 | Disposition: A | Discharge status: Home / Self Care | Payer: No Typology Code available for payment source | Source: Ambulatory Visit | Attending: Primary Care | Admitting: Primary Care

## 2020-01-04 ENCOUNTER — Other Ambulatory Visit: Payer: Self-pay | Admitting: Primary Care

## 2020-01-04 ENCOUNTER — Telehealth: Payer: Self-pay | Admitting: Family Medicine

## 2020-01-04 ENCOUNTER — Telehealth: Payer: Self-pay

## 2020-01-04 ENCOUNTER — Encounter: Payer: Self-pay | Admitting: Primary Care

## 2020-01-04 ENCOUNTER — Ambulatory Visit (INDEPENDENT_AMBULATORY_CARE_PROVIDER_SITE_OTHER): Payer: No Typology Code available for payment source | Admitting: Primary Care

## 2020-01-04 VITALS — BP 122/78 | Temp 98.0°F | Ht 61.0 in | Wt 214.0 lb

## 2020-01-04 DIAGNOSIS — R1010 Upper abdominal pain, unspecified: Secondary | ICD-10-CM

## 2020-01-04 DIAGNOSIS — R109 Unspecified abdominal pain: Secondary | ICD-10-CM | POA: Diagnosis present

## 2020-01-04 LAB — BASIC METABOLIC PANEL
BUN: 12 mg/dL (ref 6–23)
CO2: 27 mEq/L (ref 19–32)
Calcium: 8.8 mg/dL (ref 8.4–10.5)
Chloride: 103 mEq/L (ref 96–112)
Creatinine, Ser: 0.99 mg/dL (ref 0.40–1.20)
GFR: 64.95 mL/min (ref >60.00)
Glucose, Bld: 105 mg/dL — ABNORMAL HIGH (ref 70–99)
Potassium: 4 mEq/L (ref 3.5–5.1)
Sodium: 136 mEq/L (ref 135–145)

## 2020-01-04 LAB — CBC WITH DIFFERENTIAL/PLATELET
Basophils Absolute: 0 10*3/uL (ref 0.0–0.1)
Basophils Relative: 0.4 % (ref 0.0–3.0)
Eosinophils Absolute: 0.1 10*3/uL (ref 0.0–0.7)
Eosinophils Relative: 1.1 % (ref 0.0–5.0)
HCT: 39.5 % (ref 36.0–46.0)
Hemoglobin: 13.2 g/dL (ref 12.0–15.0)
Lymphocytes Relative: 23.6 % (ref 12.0–46.0)
Lymphs Abs: 3 10*3/uL (ref 0.7–4.0)
MCHC: 33.5 g/dL (ref 30.0–36.0)
MCV: 84.8 fl (ref 78.0–100.0)
Monocytes Absolute: 0.8 10*3/uL (ref 0.1–1.0)
Monocytes Relative: 6 % (ref 3.0–12.0)
Neutro Abs: 8.7 10*3/uL — ABNORMAL HIGH (ref 1.4–7.7)
Neutrophils Relative %: 68.9 % (ref 43.0–77.0)
Platelets: 392 10*3/uL (ref 150.0–400.0)
RBC: 4.66 Mil/uL (ref 3.87–5.11)
RDW: 12.4 % (ref 11.5–15.5)
WBC: 12.7 10*3/uL — ABNORMAL HIGH (ref 4.0–10.5)

## 2020-01-04 LAB — POC URINALSYSI DIPSTICK (AUTOMATED)
Bilirubin, UA: POSITIVE
Blood, UA: NEGATIVE
Glucose, UA: NEGATIVE
Ketones, UA: NEGATIVE
Leukocytes, UA: NEGATIVE
Nitrite, UA: NEGATIVE
Protein, UA: POSITIVE — AB
Spec Grav, UA: >=1.03 — AB (ref 1.010–1.025)
Urobilinogen, UA: 1 E.U./dL
pH, UA: 6 (ref 5.0–8.0)

## 2020-01-04 NOTE — Telephone Encounter (Signed)
Pt has already seen Allayne Gitelman NP in office this morning. FYI to Allayne Gitelman NP.

## 2020-01-04 NOTE — Telephone Encounter (Signed)
[facility] Day - Client TELEPHONE ADVICE RECORD Access[employer]urse Patient Name: Yvonne Trujillo Gender: Female DOB: 1985/09/11 Age: 34 Y 1 M 20 D Return Phone Number: [phone] (Primary) [place] [address] Client [facility] Day - Client Client Site [facility] - Day Physician Tower, Idamae Schuller - MD Contact Type Call Who Is Calling Patient / Member / Family / Caregiver Call Type Triage / Clinical Relationship To Patient Self Return Phone Number [phone] (Primary) Chief Complaint Abdominal Pain Reason for Call Symptomatic / Request for Health Information Initial Comment Caller states she is having abdominal pain and back pain and nausea. Translation No Nurse Assessment Nurse: Melanee Spry, RN, Beth Date/Time (Eastern Time): 01/04/2020 8:24:57 AM Confirm and document reason for call. If symptomatic, describe symptoms. ---Caller states she is having abdominal pain , pain level 5/10, and back pain and nausea. Started night before last, started with back pain, moved to abd. Went to ER last night, waited 3 hours, then left. Denies fever. Has the patient had close contact with a person known or suspected to have the novel coronavirus illness OR traveled / lives in area with major community spread (including international travel) in the last 14 days from the onset of symptoms? * If Asymptomatic, screen for exposure and travel within the last 14 days. ---No Does the patient have any new or worsening symptoms? ---Yes Will a triage be completed? ---Yes Related visit to physician within the last 2 weeks? ---No Does the PT have any chronic conditions? (i.e. diabetes, asthma, this includes High risk factors for pregnancy, etc.) ---Yes List chronic conditions. ---asthma Is the patient pregnant or possibly pregnant? (Ask all females between the ages of 88-55) ---No Is this a behavioral health or substance  abuse call? ---No Guidelines Guideline Title Affirmed Question Affirmed Notes Nurse Date/Time Lamount Cohen Time) Abdominal Pain - Female [1] MILD-MODERATE pain AND [2] constant Newhart, RN, Beth 01/04/2020 8:27:26 AM PLEASE NOTE: All timestamps contained within this report are represented as [place] Standard Time. CONFIDENTIALTY NOTICE: This fax transmission is intended only for the addressee. It contains information that is legally privileged, confidential or otherwise protected from use or disclosure. If you are not the intended recipient, you are strictly prohibited from reviewing, disclosing, copying using or disseminating any of this information or taking any action in reliance on or regarding this information. If you have received this fax in error, please notify us immediately by telephone so that we can arrange for its return to [place]. Phone: [phone], Toll-Free: [phone], Fax: [phone] Page: 2 of 2 Call Id: [id number] Guidelines Guideline Title Affirmed Question Affirmed Notes Nurse Date/Time Lamount Cohen Time) AND [3] present > 2 hours Disp. Time Lamount Cohen Time) Disposition Final User 01/04/2020 8:12:49 AM Attempt made - message left Newhart, RN, Waynetta Sandy 01/04/2020 8:28:23 AM See HCP within 4 Hours (or PCP triage) Yes Newhart, RN, Beth Caller Disagree/Comply Comply Caller Understands Yes PreDisposition Did not know what to do Care Advice Given Per Guideline SEE HCP WITHIN 4 HOURS (OR PCP TRIAGE): * IF OFFICE WILL BE OPEN: You need to be seen within the next 3 or 4 hours. Call your doctor (or NP/PA) now or as soon as the office opens. REST: * Lie down and rest. * Do this until seen. NOTHING BY MOUTH: * Do not eat or drink anything for now. CALL BACK IF: * You become worse. CARE ADVICE given per Abdominal Pain, Female (Adult) guideline. Comments User: June Leap, RN Date/Time Lamount Cohen Time):  01/04/2020 8:31:41 AM transferred pt to backline Referrals Warm transfer to backline

## 2020-01-04 NOTE — Telephone Encounter (Signed)
Pt called back after CT and eval from Mayra Reel, NP, pt said her sxs are worsening and her pain is sever. Pt doesn't know what to do. I did advise since so late in the day Jae Dire has left for today and PCP is out of the office. So she may not get a call back until tomorrow. Pt advise if sxs are sever or worsening she may need to go back to hospital

## 2020-01-04 NOTE — Patient Instructions (Signed)
Stop by the lab prior to leaving today. I will notify you of your results once received.   Stop by the front desk and speak with either Ashtyn or Brooke regarding your CT scan.  It was a pleasure meeting you!

## 2020-01-04 NOTE — Assessment & Plan Note (Signed)
Acute abdominal pain over the last 24 hours.  Labs from [facility] ED without clear cause for symptoms.  Exam today with tenderness throughout entire abdomen, also CVA tenderness. UA today without clear evidence of infection, will obtain culture.  Repeat labs pending including CBC with diff, BMP. Stat CT abdomen/pelvis ordered and pending.  At this time she is stable for outpatient treatment. ED precautions provided.

## 2020-01-04 NOTE — Progress Notes (Signed)
Subjective:    Patient ID: Yvonne Trujillo, female    DOB: [birth year], 34 y.o.   MRN: [id number]  HPI  This visit occurred during the SARS-CoV-2 public health emergency.  Safety protocols were in place, including screening questions prior to the visit, additional usage of staff PPE, and extensive cleaning of exam room while observing appropriate contact time as indicated for disinfecting solutions.   Ms. Mokry is a 34 year old female patient of Dr. Milinda Antis with a medical history of migraines, renal stones, hyperlipidemia, gestational hypertension, appendectomy who presents today with a chief complaint of abdominal pain.  Evaluated presented to [facility], was not actually seen, was refused care as she would need imaging, told to go to the ED. She then presented to [facility], was turned away due to symptoms, was told that she would need imaging.   She presented to [facility] ED on 01/03/20 with a chief complaint of upper abdominal and back pain with nausea. No vaginal symptoms, nausea or vomiting, urinary symptoms. She notified the nurse that she took Tums with relief. Labs were collected including CBC which showed mild leukocytosis, lipase which was negative, UA which showed 1+ blood and trace bacteria. Unfortunately wait times were too long for patient, she ended up leaving without a provider evaluation.   Today she endorses that pain began to the entire back, but then localized to the mid back, wrapping around to her front mid abdomen. Now her pain is located to the epigastric region, and bilateral mid back. She's now experiencing nausea without vomiting.   She denies dysuria, hematuria, vomiting, injury, vaginal symptoms. She was able to sleep a few hours at a time last night, woke up due to pain. She was able to eat something small today, is hardly drinking.   Review of Systems  Constitutional: Positive for fatigue. Negative for fever.  Gastrointestinal: Positive for abdominal pain and  nausea. Negative for blood in stool, constipation and diarrhea.  Musculoskeletal: Positive for back pain.       Past Medical History:  Diagnosis Date  . Asthma   . Concussion   . History of depression   . Kidney infection   . Kidney stone   . Migraine   . Supervision of normal first pregnancy, antepartum 06/23/2017    Clinic  [facility] Prenatal Labs Dating  Blood type:    Genetic Screen 1 Screen:    AFP:     Quad:     NIPS: Antibody:  Anatomic [place]  Rubella:   GTT Early:               Third trimester:  RPR:    Flu vaccine  HBsAg:    TDaP vaccine                                               Rhogam: HIV:   Baby Food        Breast                                        GBS: (For PCN allergy, check sensitivities) Cont     Social History   Socioeconomic History  . Marital status: Single    Spouse name: Not on file  . Number of  children: Not on file  . Years of education: Not on file  . Highest education level: Not on file  Occupational History  . Not on file  Tobacco Use  . Smoking status: Former Smoker    Years: 6.00    Types: Cigarettes  . Smokeless tobacco: Never Used  Substance and Sexual Activity  . Alcohol use: Not Currently    Alcohol/week: 0.0 standard drinks    Comment: rare  . Drug use: Not Currently  . Sexual activity: Yes    Birth control/protection: None  Other Topics Concern  . Not on file  Social History Narrative  . Not on file   Social Determinants of Health   Financial Resource Strain:   . Difficulty of Paying Living Expenses: Not on file  Food Insecurity:   . Worried About Programme researcher, broadcasting/film/video in the Last Year: Not on file  . Ran Out of Food in the Last Year: Not on file  Transportation Needs:   . Lack of Transportation (Medical): Not on file  . Lack of Transportation (Non-Medical): Not on file  Physical Activity:   . Days of Exercise per Week: Not on file  . Minutes of Exercise per Session: Not on file  Stress:   . Feeling of Stress : Not on file    Social Connections:   . Frequency of Communication with Friends and Family: Not on file  . Frequency of Social Gatherings with Friends and Family: Not on file  . Attends Religious Services: Not on file  . Active Member of Clubs or Organizations: Not on file  . Attends Banker Meetings: Not on file  . Marital Status: Not on file  Intimate Partner Violence:   . Fear of Current or Ex-Partner: Not on file  . Emotionally Abused: Not on file  . Physically Abused: Not on file  . Sexually Abused: Not on file    Past Surgical History:  Procedure Laterality Date  . APPENDECTOMY    . EXTRACORPOREAL SHOCK WAVE LITHOTRIPSY Left 07/09/2016   Procedure: EXTRACORPOREAL SHOCK WAVE LITHOTRIPSY (ESWL);  Surgeon: Vanna Scotland, MD;  Location: [facility] ORS;  Service: Urology;  Laterality: Left;    Family History  Problem Relation Age of Onset  . Alcohol abuse Maternal Uncle   . Hyperlipidemia Mother   . Hypertension Mother   . Heart disease Mother   . Hyperlipidemia Father   . Hypertension Father   . Diabetes Cousin   . Bladder Cancer Neg Hx   . Kidney cancer Neg Hx     Allergies  Allergen Reactions  . Delsym [Dextromethorphan]     Makes her feel bad    Current Outpatient Medications on File Prior to Visit  Medication Sig Dispense Refill  . albuterol (VENTOLIN HFA) 108 (90 Base) MCG/ACT inhaler Inhale 2 puffs into the lungs every 4 (four) hours as needed for wheezing or shortness of breath (cough, shortness of breath or wheezing.). 18 g 3  . ALPRAZolam (XANAX) 0.5 MG tablet TAKE ONE TABLET TWICE DAILY AS NEEDED FOR ANXIETY 30 tablet 0  . atorvastatin (LIPITOR) 20 MG tablet Take 1 tablet (20 mg total) by mouth daily. 90 tablet 0  . BLISOVI FE 1/20 1-20 MG-MCG tablet TAKE 1 TABLET BY MOUTH DAILY 3 Package 0  . buPROPion (WELLBUTRIN XL) 150 MG 24 hr tablet TAKE 1 TABLET BY MOUTH DAILY 90 tablet 0  . cefdinir (OMNICEF) 300 MG capsule Take 1 capsule (300 mg total) by mouth 2 (two)  times  daily. 20 capsule 0  . fluticasone (FLONASE) 50 MCG/ACT nasal spray Place 2 sprays into both nostrils daily. 16 g 6  . ondansetron (ZOFRAN) 4 MG tablet Take 1 tablet (4 mg total) by mouth every 8 (eight) hours as needed for nausea or vomiting. 30 tablet 3  . predniSONE (DELTASONE) 50 MG tablet Take 1 tablet with breakfast once daily x 3 days 3 tablet 0  . SUMAtriptan (IMITREX) 100 MG tablet Take 1 tablet (100 mg total) by mouth every 2 (two) hours as needed for migraine. May repeat in 2 hours if headache persists or recurs. 10 tablet 5  . valACYclovir (VALTREX) 1000 MG tablet Take 2 pills by mouth twice daily for 1 day for a cold sore 4 tablet 3   No current facility-administered medications on file prior to visit.    BP 122/78   Temp 98 F (36.7 C)   Ht 5\' 1"  (1.549 m)   Wt 214 lb (97.1 kg)   LMP 12/27/2019 (Approximate)   BMI 40.43 kg/m    Objective:   Physical Exam Constitutional:      Appearance: She is not ill-appearing.  Pulmonary:     Effort: Pulmonary effort is normal.  Abdominal:     General: Abdomen is flat. Bowel sounds are normal.     Palpations: Abdomen is soft.     Tenderness: There is generalized abdominal tenderness. There is right CVA tenderness and left CVA tenderness. There is no guarding.     Comments: More so tender to RUQ epigastric region.  Skin:    General: Skin is warm and dry.  Neurological:     Mental Status: She is alert.  Psychiatric:     Comments: Tearful             Assessment & Plan:

## 2020-01-04 NOTE — Telephone Encounter (Signed)
Pt went to [facility] in [place] and [facility] and was advised to go to ED for eval and testing. Pt went to [facility] ED had labs WBC 13; urine was hazy,small blood and rare bacteria; pt LWBS after labs. pt said has hx of kidney stone but does not feel the same as previous kidney stone. Pt had mid dull continuous upper abd pain and back pain above the waistline. Pt has nausea but no vomiting and no fever. Started 48 hrs ago. Pt has no covid symptoms, no travel and no known exposure to + covid. pth as had covid vaccines. Pt already has appt today with Allayne Gitelman NP at 9 AM. I spoke with Jae Dire and she will see pt but needs to be here at 9 AM. Pt leaving now and can be no later than 9:05.

## 2020-01-04 NOTE — Telephone Encounter (Signed)
Patient evaluated.  

## 2020-01-04 NOTE — Telephone Encounter (Signed)
Pt left er last night. She stated she had labs and urine taken last night but weight was to long and she didn't wait.  She was having back and abd pain.  She stated someone from [place] number called her. I spoke with access nurse  They have record of anyone calling from their office.  I went back to pt no one was on line to let pt know someone would give her a call

## 2020-01-04 NOTE — Telephone Encounter (Signed)
I spoke to Yvonne Trujillo about her today and think given the severity of her symptoms she needs to go to the ER (not urgent care)  If she cannot drive- I recommend [phone] which ER would be the least busy right now  So sorry to hear she is getting worse!

## 2020-01-04 NOTE — Telephone Encounter (Signed)
Spoke to pt by phone. Her symptoms are about the same and she will to to the ER if things worsen tonight. Pain is epigastric rad to the back with no urinary symptoms at all  Urine cx is pending  Low threshold to order [place] abs to better visualize the biliary system  Low threshold for urgent GI referral  She will call to update in the am if she does not go to ER  Will cc kate for [facility]

## 2020-01-05 ENCOUNTER — Telehealth: Payer: Self-pay | Admitting: Family Medicine

## 2020-01-05 DIAGNOSIS — R1010 Upper abdominal pain, unspecified: Secondary | ICD-10-CM

## 2020-01-05 LAB — URINE CULTURE
MICRO NUMBER:: 10905126
SPECIMEN QUALITY:: ADEQUATE

## 2020-01-05 NOTE — Telephone Encounter (Signed)
I ordered a stat abd [place]  Please let her know and I will send to [facility]

## 2020-01-05 NOTE — Telephone Encounter (Signed)
Left message for patient to let us know how she is.

## 2020-01-05 NOTE — Telephone Encounter (Signed)
[place] is scheduled for Tuesday 01/09/20 at 8:30 at [facility]  Pt is aware. She is going to see If she can get off work. She will call back to let us know

## 2020-01-05 NOTE — Telephone Encounter (Signed)
Got it.  I just ordered stat GI consult (first available) to get that in the works while we are waiting on ultrasound.  If symptoms worsen then please do go to ER over the weekend as well  If something changes let me know

## 2020-01-05 NOTE — Telephone Encounter (Signed)
I will work on GI Referral .   Please see Referral Message  Dr Milinda Antis,   I Called patient to see when the last time was she ate as this affects when the scan can be done - pt ate lunch (small amt) at 12pm.   Called Centralized Scheduling for [facility], unable to scan her today given her eating lunch. Must be fasting 6-8hrs prior to exam.   Pt is scheduled tentatively or Tuesday 9/7 at 8:30am at [facility] - NPO after 12am   Pt is very upset, does not want to wait until Tuesday given her amount of pain.   I reached out to [facility] to see if they are able to scan the patient today, they stated the same, need to be fasting 8 hours prior. They were unable to give any recommendations of who might be able to scan the patient today or this weekend. All locations are closed on the weekends and Monday due to the Holiday.   Called [facility] in [place], [place] with Angie, must be 6hrs without food or water for even a STAT US Abd. She is going to call the facility to see if there is anyway to scan her. Unfortunately they are able to bypass the recommendations and scan her today. The only recommendation for scanning any sooner than Tuesday is the Emergency room.   Will send note to Dr Milinda Antis letting her know.

## 2020-01-05 NOTE — Telephone Encounter (Signed)
Pt notified [place] ordered and our [facility] will call to schedule appt

## 2020-01-05 NOTE — Telephone Encounter (Signed)
Pt is aware of recommendation. She is working with her boss to try and get off on Tuesday to have the scan done. She will call back and let me know. There is a possibility that we have to push out her scan d/t work availability.   I spoke with Dr Servando Snare at [facility] - their office closed today at 12:30pm - he took the patient's information and stated that he would pass it along to the schedulers on Tuesday to schedule the patient.   The patient is aware that someone from GI will be calling her hopefully on Tuesday.   FYI

## 2020-01-05 NOTE — Telephone Encounter (Signed)
Understood that [place] cannot be done today  If she has inc in pain over weekend- again inst to go to ER as we discussed Keep [place] appt for Tuesday  I also went ahead with an urgent GI ref-please let her know and I will send to [facility]  I would like to get that in the works

## 2020-01-05 NOTE — Telephone Encounter (Signed)
Spoke with patient.  She says pain level is 4.  More tender on right side than left.  She did not go to ED.  Omeprazole has not helped.  Ibuprofen and tylenol only take the edge off.

## 2020-01-05 NOTE — Telephone Encounter (Signed)
Noted. I do not see where she's been to the ED. How is she doing?

## 2020-01-05 NOTE — Telephone Encounter (Signed)
Verbally relayed message to PCP and she does want complete US due to pt having pain on the entire abd area. Victorino Dike notified and verbalized understanding

## 2020-01-05 NOTE — Telephone Encounter (Signed)
Yvonne Trujillo with the [place] dpt called with a question regarding [place] order. PCP ordered a complete abdominal US but notes for reason of Korea say:   "CT was normal but want to better visualize liver/gallbladder with USDx: Pain of upper abdomen"  Yvonne Trujillo just wants to make sure that PCP needed full "complete abdominal US" instead of just the right upper quad" [place] that would visualize the liver and gallbladder  [facility] request CB [phone]

## 2020-01-05 NOTE — Addendum Note (Signed)
Addended by: Roxy Manns A on: 01/05/2020 11:37 AM   Modules accepted: Orders

## 2020-01-05 NOTE — Telephone Encounter (Signed)
Thanks so much. 

## 2020-01-05 NOTE — Telephone Encounter (Signed)
Noted and agree. 

## 2020-01-09 ENCOUNTER — Other Ambulatory Visit: Payer: Self-pay

## 2020-01-09 ENCOUNTER — Ambulatory Visit: Payer: No Typology Code available for payment source | Admitting: Gastroenterology

## 2020-01-09 ENCOUNTER — Telehealth: Payer: Self-pay

## 2020-01-09 ENCOUNTER — Ambulatory Visit
Admission: RE | Admit: 2020-01-09 | Discharge: 2020-01-09 | Disposition: A | Discharge status: Home / Self Care | Payer: No Typology Code available for payment source | Source: Ambulatory Visit | Attending: Family Medicine | Admitting: Family Medicine

## 2020-01-09 ENCOUNTER — Encounter: Payer: Self-pay | Admitting: Gastroenterology

## 2020-01-09 VITALS — BP 111/78 | HR 95 | Temp 98.4°F | Wt 213.2 lb

## 2020-01-09 DIAGNOSIS — R1010 Upper abdominal pain, unspecified: Secondary | ICD-10-CM | POA: Insufficient documentation

## 2020-01-09 DIAGNOSIS — R1013 Epigastric pain: Secondary | ICD-10-CM

## 2020-01-09 DIAGNOSIS — R110 Nausea: Secondary | ICD-10-CM

## 2020-01-09 NOTE — Progress Notes (Signed)
Arlyss Repress, MD [address]  [address]  [place], [place] [address]  Main: [phone]  Fax: [phone]    Gastroenterology Consultation  Referring Provider:     Judy Pimple, MD Primary Care Physician:  Tower, Audrie Gallus, MD Primary Gastroenterologist:  Dr. Arlyss Repress Reason for Consultation:     Epigastric pain        HPI:   Stormey Wilborn Huwe is a 34 y.o. female referred by Dr. Milinda Antis, Audrie Gallus, MD  for consultation & management of epigastric pain.  Patient reports that she started experiencing epigastric pain radiating to mid back and to right upper quadrant, since last Wednesday, describes it as more or less constant, worse after eating, moderate to severe in intensity associated with nausea, abdominal bloating and change in stool caliber, her stools are loose and mushy.  Generally her bowel movements are regular, goes every day or every other day.  No similar symptoms in the past.  She reports eating at home the day before onset of symptoms.  She does generally eat out, about 3 times a week.  She takes ibuprofen intermittently as needed for migraine headaches.  She denies heartburn, weight loss, rectal bleeding, melena.  She went to urgent care and then to ER, could not wait due to long wait.  Her labs revealed mild leukocytosis 13, normal LFTs, UA was normal.  CT renal stone protocol revealed nonobstructive renal calculi in bilateral kidneys.  Complete abdominal ultrasound revealed normal liver and gallbladder, and patent portal vein and normal spleen  Patient took omeprazole 40 mg as well as Tums with modest relief only  She does not smoke or drink alcohol.  She works from home for [employer]   NSAIDs: Ibuprofen 800 mg as needed  Antiplts/Anticoagulants/Anti thrombotics: None  GI Procedures: None She denies family history of GI malignancy  Past Medical History:  Diagnosis Date   Asthma    Concussion    History of depression    Kidney infection     Kidney stone    Migraine    Supervision of normal first pregnancy, antepartum 06/23/2017    Clinic  [facility] Prenatal Labs Dating  Blood type:    Genetic Screen 1 Screen:    AFP:     Quad:     NIPS: Antibody:  Anatomic [place]  Rubella:   GTT Early:               Third trimester:  RPR:    Flu vaccine  HBsAg:    TDaP vaccine                                               Rhogam: HIV:   Baby Food        Breast                                        GBS: (For PCN allergy, check sensitivities) Cont    Past Surgical History:  Procedure Laterality Date   APPENDECTOMY     EXTRACORPOREAL SHOCK WAVE LITHOTRIPSY Left 07/09/2016   Procedure: EXTRACORPOREAL SHOCK WAVE LITHOTRIPSY (ESWL);  Surgeon: Vanna Scotland, MD;  Location: [facility] ORS;  Service: Urology;  Laterality: Left;    Current Outpatient Medications:    albuterol (  VENTOLIN HFA) 108 (90 Base) MCG/ACT inhaler, Inhale 2 puffs into the lungs every 4 (four) hours as needed for wheezing or shortness of breath (cough, shortness of breath or wheezing.)., Disp: 18 g, Rfl: 3   ALPRAZolam (XANAX) 0.5 MG tablet, TAKE ONE TABLET TWICE DAILY AS NEEDED FOR ANXIETY, Disp: 30 tablet, Rfl: 0   atorvastatin (LIPITOR) 20 MG tablet, Take 1 tablet (20 mg total) by mouth daily., Disp: 90 tablet, Rfl: 0   BLISOVI FE 1/20 1-20 MG-MCG tablet, TAKE 1 TABLET BY MOUTH DAILY, Disp: 3 Package, Rfl: 0   buPROPion (WELLBUTRIN XL) 150 MG 24 hr tablet, TAKE 1 TABLET BY MOUTH DAILY, Disp: 90 tablet, Rfl: 0   fluticasone (FLONASE) 50 MCG/ACT nasal spray, Place 2 sprays into both nostrils daily., Disp: 16 g, Rfl: 6   ondansetron (ZOFRAN) 4 MG tablet, Take 1 tablet (4 mg total) by mouth every 8 (eight) hours as needed for nausea or vomiting., Disp: 30 tablet, Rfl: 3   SUMAtriptan (IMITREX) 100 MG tablet, Take 1 tablet (100 mg total) by mouth every 2 (two) hours as needed for migraine. May repeat in 2 hours if headache persists or recurs., Disp: 10 tablet, Rfl: 5   valACYclovir  (VALTREX) 1000 MG tablet, Take 2 pills by mouth twice daily for 1 day for a cold sore, Disp: 4 tablet, Rfl: 3   Family History  Problem Relation Age of Onset   Alcohol abuse Maternal Uncle    Hyperlipidemia Mother    Hypertension Mother    Heart disease Mother    Hyperlipidemia Father    Hypertension Father    Diabetes Cousin    Bladder Cancer Neg Hx    Kidney cancer Neg Hx      Social History   Tobacco Use   Smoking status: Former Smoker    Years: 6.00    Types: Cigarettes   Smokeless tobacco: Never Used  Substance Use Topics   Alcohol use: Not Currently    Alcohol/week: 0.0 standard drinks    Comment: rare   Drug use: Not Currently    Allergies as of 01/09/2020 - Review Complete 01/09/2020  Allergen Reaction Noted   Delsym [dextromethorphan]  07/26/2019    Review of Systems:    All systems reviewed and negative except where noted in HPI.   Physical Exam:  BP 111/78 (BP Location: Left Arm, Patient Position: Sitting, Cuff Size: Large)    Pulse 95    Temp 98.4 F (36.9 C) (Oral)    Wt 213 lb 4 oz (96.7 kg)    LMP 12/27/2019 (Approximate)    BMI 40.29 kg/m  Patient's last menstrual period was 12/27/2019 (approximate).  General:   Alert,  Well-developed, well-nourished, pleasant and cooperative in NAD Head:  Normocephalic and atraumatic. Eyes:  Sclera clear, no icterus.   Conjunctiva pink. Ears:  Normal auditory acuity. Nose:  No deformity, discharge, or lesions. Mouth:  No deformity or lesions,oropharynx pink & moist. Neck:  Supple; no masses or thyromegaly. Lungs:  Respirations even and unlabored.  Clear throughout to auscultation.   No wheezes, crackles, or rhonchi. No acute distress. Heart:  Regular rate and rhythm; no murmurs, clicks, rubs, or gallops. Abdomen:  Normal bowel sounds. Soft, epigastric and midabdominal tenderness and non-distended without masses, hepatosplenomegaly or hernias noted.  No guarding or rebound tenderness.   Rectal: Not  performed Msk:  Symmetrical without gross deformities. Good, equal movement & strength bilaterally. Pulses:  Normal pulses noted. Extremities:  No clubbing or edema.  No  cyanosis. Neurologic:  Alert and oriented x3;  grossly normal neurologically. Skin:  Intact without significant lesions or rashes. No jaundice. Psych:  Alert and cooperative. Normal mood and affect.  Imaging Studies: Reviewed  Assessment and Plan:   Krishika Bugge Tomassetti is a 34 y.o. female with 1 week history of upper abdominal pain associated with nausea, abdominal bloating and change in bowel habits, history of intermittent NSAID use  Differentials include H. pylori gastritis or peptic ulcer disease  Recommend H. pylori breath test and H. pylori IgG, treat if positive.  If negative, will perform upper endoscopy for further evaluation   Follow up in 4 to 6 weeks   Arlyss Repress, MD

## 2020-01-09 NOTE — Telephone Encounter (Signed)
-----   Message from Judy Pimple, MD sent at 01/09/2020  9:45 AM EDT ----- Abdominal ultrasound is normal-very reassuring  F/u with GI as planned  [facility] she feels better soon !

## 2020-01-09 NOTE — Telephone Encounter (Signed)
Pt was able to get approved for todays appt.   Nothing further needed.

## 2020-01-11 LAB — H. PYLORI ANTIBODY, IGG: H. pylori, IgG AbS: 0.22 Index Value (ref 0.00–0.79)

## 2020-01-11 LAB — H. PYLORI BREATH TEST: H pylori Breath Test: NEGATIVE

## 2020-01-12 ENCOUNTER — Encounter: Payer: Self-pay | Admitting: Gastroenterology

## 2020-01-12 ENCOUNTER — Other Ambulatory Visit: Payer: Self-pay | Admitting: Gastroenterology

## 2020-01-12 DIAGNOSIS — R1013 Epigastric pain: Secondary | ICD-10-CM

## 2020-01-12 MED ORDER — OMEPRAZOLE 40 MG PO CPDR: 40.0000 mg | DELAYED_RELEASE_CAPSULE | Freq: Two times a day (BID) | ORAL | 0 refills | Status: DC

## 2020-02-12 ENCOUNTER — Telehealth: Payer: Self-pay | Admitting: Gastroenterology

## 2020-02-12 NOTE — Telephone Encounter (Signed)
Patient called to cancel Appt. Scheduled on 02/14/2020. Patient stated that she would call back to reschedule.

## 2020-02-14 ENCOUNTER — Ambulatory Visit: Payer: No Typology Code available for payment source | Admitting: Gastroenterology

## 2020-03-18 ENCOUNTER — Other Ambulatory Visit: Payer: Self-pay | Admitting: Family Medicine

## 2020-03-18 DIAGNOSIS — Z30011 Encounter for initial prescription of contraceptive pills: Secondary | ICD-10-CM

## 2020-03-19 ENCOUNTER — Other Ambulatory Visit: Payer: Self-pay | Admitting: Family Medicine

## 2020-03-20 NOTE — Telephone Encounter (Signed)
Last PE was 8/20  Please schedule PE and refill until then

## 2020-03-20 NOTE — Telephone Encounter (Signed)
Last CPE was 12/20/19 and no recent or future f/u or CPE appts., please advise

## 2020-03-21 NOTE — Telephone Encounter (Signed)
meds refilled once and Carrie will reach out to pt to try and schedule appt 

## 2020-03-21 NOTE — Telephone Encounter (Signed)
I left a message for patient to return my call.  Patient needs to schedule a physical appointment with Dr.Tower.

## 2020-04-04 ENCOUNTER — Telehealth: Payer: Self-pay | Admitting: Family Medicine

## 2020-04-04 DIAGNOSIS — Z0000 Encounter for general adult medical examination without abnormal findings: Secondary | ICD-10-CM

## 2020-04-04 DIAGNOSIS — E7800 Pure hypercholesterolemia, unspecified: Secondary | ICD-10-CM

## 2020-04-04 NOTE — Telephone Encounter (Signed)
-----   Message from Aquilla Solian, RT sent at 03/27/2020 10:37 AM EST ----- Regarding: Lab Orders for Friday 12.3.2021 Please place lab orders for Friday 12.3.2021, office visit for physical on Friday 12.10.2021 Thank you, Jones Bales RT(R)

## 2020-04-05 ENCOUNTER — Other Ambulatory Visit: Payer: No Typology Code available for payment source

## 2020-04-12 ENCOUNTER — Encounter: Payer: No Typology Code available for payment source | Admitting: Family Medicine

## 2020-05-06 ENCOUNTER — Other Ambulatory Visit: Payer: No Typology Code available for payment source

## 2020-05-07 ENCOUNTER — Other Ambulatory Visit: Payer: No Typology Code available for payment source

## 2020-05-16 ENCOUNTER — Encounter: Payer: No Typology Code available for payment source | Admitting: Family Medicine

## 2020-05-28 ENCOUNTER — Other Ambulatory Visit (INDEPENDENT_AMBULATORY_CARE_PROVIDER_SITE_OTHER): Payer: No Typology Code available for payment source

## 2020-05-28 ENCOUNTER — Other Ambulatory Visit: Payer: Self-pay

## 2020-05-28 DIAGNOSIS — E7800 Pure hypercholesterolemia, unspecified: Secondary | ICD-10-CM | POA: Diagnosis not present

## 2020-05-28 DIAGNOSIS — Z0000 Encounter for general adult medical examination without abnormal findings: Secondary | ICD-10-CM

## 2020-05-28 LAB — COMPREHENSIVE METABOLIC PANEL
ALT: 9 U/L (ref 0–35)
AST: 9 U/L (ref 0–37)
Albumin: 3.7 g/dL (ref 3.5–5.2)
Alkaline Phosphatase: 84 U/L (ref 39–117)
BUN: 10 mg/dL (ref 6–23)
CO2: 25 mEq/L (ref 19–32)
Calcium: 8.9 mg/dL (ref 8.4–10.5)
Chloride: 102 mEq/L (ref 96–112)
Creatinine, Ser: 0.83 mg/dL (ref 0.40–1.20)
GFR: 93.2 mL/min (ref >60.00)
Glucose, Bld: 100 mg/dL — ABNORMAL HIGH (ref 70–99)
Potassium: 4.5 mEq/L (ref 3.5–5.1)
Sodium: 135 mEq/L (ref 135–145)
Total Bilirubin: 0.3 mg/dL (ref 0.2–1.2)
Total Protein: 7 g/dL (ref 6.0–8.3)

## 2020-05-28 LAB — CBC WITH DIFFERENTIAL/PLATELET
Basophils Absolute: 0.1 10*3/uL (ref 0.0–0.1)
Basophils Relative: 0.5 % (ref 0.0–3.0)
Eosinophils Absolute: 0.2 10*3/uL (ref 0.0–0.7)
Eosinophils Relative: 1.6 % (ref 0.0–5.0)
HCT: 36.7 % (ref 36.0–46.0)
Hemoglobin: 12.6 g/dL (ref 12.0–15.0)
Lymphocytes Relative: 27.7 % (ref 12.0–46.0)
Lymphs Abs: 3.1 10*3/uL (ref 0.7–4.0)
MCHC: 34.3 g/dL (ref 30.0–36.0)
MCV: 84 fl (ref 78.0–100.0)
Monocytes Absolute: 0.6 10*3/uL (ref 0.1–1.0)
Monocytes Relative: 5.1 % (ref 3.0–12.0)
Neutro Abs: 7.3 10*3/uL (ref 1.4–7.7)
Neutrophils Relative %: 65.1 % (ref 43.0–77.0)
Platelets: 396 10*3/uL (ref 150.0–400.0)
RBC: 4.37 Mil/uL (ref 3.87–5.11)
RDW: 12.9 % (ref 11.5–15.5)
WBC: 11.1 10*3/uL — ABNORMAL HIGH (ref 4.0–10.5)

## 2020-05-28 LAB — LIPID PANEL
Cholesterol: 165 mg/dL (ref 0–200)
HDL: 38.8 mg/dL — ABNORMAL LOW (ref >39.00)
LDL Cholesterol: 96 mg/dL (ref 0–99)
NonHDL: 126.51
Total CHOL/HDL Ratio: 4
Triglycerides: 152 mg/dL — ABNORMAL HIGH (ref 0.0–149.0)
VLDL: 30.4 mg/dL (ref 0.0–40.0)

## 2020-05-28 LAB — TSH: TSH: 4.22 u[IU]/mL (ref 0.35–4.50)

## 2020-06-07 ENCOUNTER — Other Ambulatory Visit (HOSPITAL_COMMUNITY)
Admission: RE | Admit: 2020-06-07 | Discharge: 2020-06-07 | Disposition: A | Discharge status: Home / Self Care | Payer: No Typology Code available for payment source | Source: Ambulatory Visit | Attending: Family Medicine | Admitting: Family Medicine

## 2020-06-07 ENCOUNTER — Other Ambulatory Visit: Payer: Self-pay

## 2020-06-07 ENCOUNTER — Ambulatory Visit (INDEPENDENT_AMBULATORY_CARE_PROVIDER_SITE_OTHER): Payer: No Typology Code available for payment source | Admitting: Family Medicine

## 2020-06-07 ENCOUNTER — Encounter: Payer: Self-pay | Admitting: Family Medicine

## 2020-06-07 VITALS — BP 122/64 | HR 114 | Temp 96.9°F | Ht 61.0 in | Wt 214.1 lb

## 2020-06-07 DIAGNOSIS — Z0000 Encounter for general adult medical examination without abnormal findings: Secondary | ICD-10-CM

## 2020-06-07 DIAGNOSIS — E7800 Pure hypercholesterolemia, unspecified: Secondary | ICD-10-CM | POA: Diagnosis not present

## 2020-06-07 DIAGNOSIS — Z01419 Encounter for gynecological examination (general) (routine) without abnormal findings: Secondary | ICD-10-CM | POA: Diagnosis present

## 2020-06-07 DIAGNOSIS — F418 Other specified anxiety disorders: Secondary | ICD-10-CM | POA: Diagnosis not present

## 2020-06-07 DIAGNOSIS — Z30011 Encounter for initial prescription of contraceptive pills: Secondary | ICD-10-CM

## 2020-06-07 MED ORDER — ATORVASTATIN CALCIUM 20 MG PO TABS
20.0000 mg | ORAL_TABLET | Freq: Every day | ORAL | 3 refills | Status: DC
Start: 2020-06-07 — End: 2021-11-13

## 2020-06-07 MED ORDER — NORETHIN ACE-ETH ESTRAD-FE 1-20 MG-MCG PO TABS: 1.0000 | ORAL_TABLET | Freq: Every day | ORAL | 3 refills | Status: DC

## 2020-06-07 MED ORDER — BUPROPION HCL ER (XL) 150 MG PO TB24
150.0000 mg | ORAL_TABLET | Freq: Every day | ORAL | 3 refills | Status: DC
Start: 2020-06-07 — End: 2020-09-16

## 2020-06-07 NOTE — Progress Notes (Signed)
Subjective:    Patient ID: Yvonne Trujillo, female    DOB: [birth year], 35 y.o.   MRN: [id number]  This visit occurred during the SARS-CoV-2 public health emergency.  Safety protocols were in place, including screening questions prior to the visit, additional usage of staff PPE, and extensive cleaning of exam room while observing appropriate contact time as indicated for disinfecting solutions.    HPI Here for health maintenance exam and to review chronic medical problems    Wt Readings from Last 3 Encounters:  06/07/20 214 lb 2 oz (97.1 kg)  01/09/20 213 lb 4 oz (96.7 kg)  01/04/20 214 lb (97.1 kg)  not eating well right now or exercising  40.46 kg/m   New job- sitting at a desk and working from [employer]  Needs options for exercise  Does like the job- way less stressful    Doing ok overall   covid status -Radiographer, therapeutic / planning booster   Had covid last month -still a little lingering cough    Pap 2/19 -negative at gyn  Takes OC generic loestrin 1/20- does well  Menses are regular, last 4-5 days , not too heavy or painful (fairly light)  No h/o abn pap  No h/o HPV   No plans for pregnancy yet   Self breast exam - no lumps   Tdap 6/19  Flu shot 9/21   BP Readings from Last 3 Encounters:  06/07/20 122/64  01/09/20 111/78  01/04/20 122/78   Pulse Readings from Last 3 Encounters:  06/07/20 (!) 114  01/09/20 95  01/03/20 100   Hyperlipidemia  Lab Results  Component Value Date   CHOL 165 05/28/2020   CHOL 162 03/22/2019   CHOL 222 (H) 12/16/2018   Lab Results  Component Value Date   HDL 38.80 (L) 05/28/2020   HDL 36.00 (L) 03/22/2019   HDL 35.70 (L) 12/16/2018   Lab Results  Component Value Date   LDLCALC 96 05/28/2020   LDLCALC 103 (H) 03/22/2019   LDLCALC 154 (H) 12/16/2018   Lab Results  Component Value Date   TRIG 152.0 (H) 05/28/2020   TRIG 119.0 03/22/2019   TRIG 160.0 (H) 12/16/2018   Lab Results  Component Value Date   CHOLHDL 4  05/28/2020   CHOLHDL 5 03/22/2019   CHOLHDL 6 12/16/2018   Lab Results  Component Value Date   LDLDIRECT 223.2 04/24/2013  strong fam hx of CAD Atorvastatin 20 mg daily   Glucose 100 fasting   Lab Results  Component Value Date   CREATININE 0.83 05/28/2020   BUN 10 05/28/2020   NA 135 05/28/2020   K 4.5 05/28/2020   CL 102 05/28/2020   CO2 25 05/28/2020   Lab Results  Component Value Date   ALT 9 05/28/2020   AST 9 05/28/2020   ALKPHOS 84 05/28/2020   BILITOT 0.3 05/28/2020    Lab Results  Component Value Date   WBC 11.1 (H) 05/28/2020   HGB 12.6 05/28/2020   HCT 36.7 05/28/2020   MCV 84.0 05/28/2020   PLT 396.0 05/28/2020    Lab Results  Component Value Date   TSH 4.22 05/28/2020   no inflammation or infection  Has chronic eczema ? Reason for wbc elevation   Depression with anxiety  Taking wellbutrin xl 150 mg daily  Xanax prn   Patient Active Problem List   Diagnosis Date Noted  . Visit for routine gyn exam 06/07/2020  . Pain of upper abdomen 01/04/2020  . Acute  viral bronchitis 07/26/2019  . Nasal congestion 12/20/2018  . Morbid obesity (HCC) 12/20/2018  . History of gestational hypertension 01/15/2018  . Herpes simplex 01/18/2015  . Hyperlipidemia 04/25/2013  . Migraine 04/24/2013  . Depression with anxiety 04/24/2013  . Hirsutism 04/24/2013  . Acne 04/24/2013  . History of kidney stones 04/24/2013  . Routine general medical examination at a health care facility 04/24/2013   Past Medical History:  Diagnosis Date  . Asthma   . Concussion   . History of depression   . Kidney infection   . Kidney stone   . Migraine   . Supervision of normal first pregnancy, antepartum 06/23/2017    Clinic  [facility] Prenatal Labs Dating  Blood type:    Genetic Screen 1 Screen:    AFP:     Quad:     NIPS: Antibody:  Anatomic [place]  Rubella:   GTT Early:               Third trimester:  RPR:    Flu vaccine  HBsAg:    TDaP vaccine                                                Rhogam: HIV:   Baby Food        Breast                                        GBS: (For PCN allergy, check sensitivities) Cont   Past Surgical History:  Procedure Laterality Date  . APPENDECTOMY    . EXTRACORPOREAL SHOCK WAVE LITHOTRIPSY Left 07/09/2016   Procedure: EXTRACORPOREAL SHOCK WAVE LITHOTRIPSY (ESWL);  Surgeon: Vanna Scotland, MD;  Location: [facility] ORS;  Service: Urology;  Laterality: Left;   Social History   Tobacco Use  . Smoking status: Former Smoker    Years: 6.00    Types: Cigarettes  . Smokeless tobacco: Never Used  Substance Use Topics  . Alcohol use: Not Currently    Alcohol/week: 0.0 standard drinks    Comment: rare  . Drug use: Not Currently   Family History  Problem Relation Age of Onset  . Alcohol abuse Maternal Uncle   . Hyperlipidemia Mother   . Hypertension Mother   . Heart disease Mother   . Hyperlipidemia Father   . Hypertension Father   . Diabetes Cousin   . Bladder Cancer Neg Hx   . Kidney cancer Neg Hx    Allergies  Allergen Reactions  . Delsym [Dextromethorphan]     Makes her feel bad   Current Outpatient Medications on File Prior to Visit  Medication Sig Dispense Refill  . albuterol (VENTOLIN HFA) 108 (90 Base) MCG/ACT inhaler Inhale 2 puffs into the lungs every 4 (four) hours as needed for wheezing or shortness of breath (cough, shortness of breath or wheezing.). 18 g 3  . ALPRAZolam (XANAX) 0.5 MG tablet TAKE ONE TABLET TWICE DAILY AS NEEDED FOR ANXIETY 30 tablet 0  . fluticasone (FLONASE) 50 MCG/ACT nasal spray Place 2 sprays into both nostrils daily. 16 g 6  . ondansetron (ZOFRAN) 4 MG tablet Take 1 tablet (4 mg total) by mouth every 8 (eight) hours as needed for nausea or vomiting. 30 tablet 3  . SUMAtriptan (IMITREX) 100 MG tablet Take  1 tablet (100 mg total) by mouth every 2 (two) hours as needed for migraine. May repeat in 2 hours if headache persists or recurs. 10 tablet 5  . valACYclovir (VALTREX) 1000 MG tablet Take 2 pills by  mouth twice daily for 1 day for a cold sore 4 tablet 3  . omeprazole (PRILOSEC) 40 MG capsule Take 1 capsule (40 mg total) by mouth 2 (two) times daily before a meal. 60 capsule 0   No current facility-administered medications on file prior to visit.    Review of Systems  Constitutional: Positive for fatigue. Negative for activity change, appetite change, fever and unexpected weight change.  HENT: Negative for congestion, ear pain, rhinorrhea, sinus pressure and sore throat.   Eyes: Negative for pain, redness and visual disturbance.  Respiratory: Negative for cough, shortness of breath and wheezing.   Cardiovascular: Negative for chest pain and palpitations.  Gastrointestinal: Negative for abdominal pain, blood in stool, constipation and diarrhea.  Endocrine: Negative for polydipsia and polyuria.  Genitourinary: Negative for dysuria, frequency and urgency.  Musculoskeletal: Negative for arthralgias, back pain and myalgias.  Skin: Negative for pallor and rash.  Allergic/Immunologic: Negative for environmental allergies.  Neurological: Negative for dizziness, syncope and headaches.  Hematological: Negative for adenopathy. Does not bruise/bleed easily.  Psychiatric/Behavioral: Negative for decreased concentration and dysphoric mood. The patient is not nervous/anxious.        Objective:   Physical Exam Constitutional:      General: She is not in acute distress.    Appearance: Normal appearance. She is well-developed. She is obese. She is not ill-appearing or diaphoretic.  HENT:     Head: Normocephalic and atraumatic.     Right Ear: Tympanic membrane, ear canal and external ear normal.     Left Ear: Tympanic membrane, ear canal and external ear normal.     Nose: Nose normal. No congestion.     Mouth/Throat:     Mouth: Mucous membranes are moist.     Pharynx: Oropharynx is clear. No posterior oropharyngeal erythema.  Eyes:     General: No scleral icterus.    Extraocular Movements:  Extraocular movements intact.     Conjunctiva/sclera: Conjunctivae normal.     Pupils: Pupils are equal, round, and reactive to light.  Neck:     Thyroid: No thyromegaly.     Vascular: No carotid bruit or JVD.  Cardiovascular:     Rate and Rhythm: Normal rate and regular rhythm.     Pulses: Normal pulses.     Heart sounds: Normal heart sounds. No gallop.   Pulmonary:     Effort: Pulmonary effort is normal. No respiratory distress.     Breath sounds: Normal breath sounds. No wheezing.     Comments: Good air exch Chest:     Chest wall: No tenderness.  Abdominal:     General: Bowel sounds are normal. There is no distension or abdominal bruit.     Palpations: Abdomen is soft. There is no mass.     Tenderness: There is no abdominal tenderness.     Hernia: No hernia is present.  Genitourinary:    Comments: Breast exam: No mass, nodules, thickening, tenderness, bulging, retraction, inflamation, nipple discharge or skin changes noted.  No axillary or clavicular LA.              Anus appears normal w/o hemorrhoids or masses     External genitalia : nl appearance and hair distribution/no lesions     Urethral meatus : nl  size, no lesions or prolapse     Urethra: no masses, tenderness or scarring    Bladder : no masses or tenderness     Vagina: nl general appearance, no discharge or  Lesions, no significant cystocele  or rectocele     Cervix: no lesions/ discharge or friability    Uterus: nl size, contour, position, and mobility (not fixed) , non tender    Adnexa : no masses, tenderness, enlargement or nodularity       Musculoskeletal:        General: No tenderness. Normal range of motion.     Cervical back: Normal range of motion and neck supple. No rigidity. No muscular tenderness.     Right lower leg: No edema.     Left lower leg: No edema.  Lymphadenopathy:     Cervical: No cervical adenopathy.  Skin:    General: Skin is warm and dry.     Coloration: Skin  is not pale.     Findings: No erythema or rash.  Neurological:     Mental Status: She is alert. Mental status is at baseline.     Cranial Nerves: No cranial nerve deficit.     Motor: No abnormal muscle tone.     Coordination: Coordination normal.     Gait: Gait normal.     Deep Tendon Reflexes: Reflexes are normal and symmetric. Reflexes normal.  Psychiatric:        Mood and Affect: Mood normal.        Cognition and Memory: Cognition and memory normal.           Assessment & Plan:   Problem List Items Addressed This Visit      Other   Depression with anxiety    Encourage good self care More exercise since working at home Plan to continue wellbutrin xl 150 mg daily  Xanax prn-minimize use       Relevant Medications   buPROPion (WELLBUTRIN XL) 150 MG 24 hr tablet   Routine general medical examination at a health care facility - Primary    Reviewed health habits including diet and exercise and skin cancer prevention Reviewed appropriate screening tests for age  Also reviewed health mt list, fam hx and immunization status , as well as social and family history   See HPI Labs reviewed  Encouraged the covid booster  Pap/gyn exam done        Hyperlipidemia    Disc goals for lipids and reasons to control them Rev last labs with pt Rev low sat fat diet in detail LDL is down to 96 Plan to continue atorvastatin 20 mg daily (pt will stop this if she tries for pregnancy in the future      Relevant Medications   atorvastatin (LIPITOR) 20 MG tablet   Morbid obesity (HCC)    Discussed how this problem influences overall health and the risks it imposes  Reviewed plan for weight loss with lower calorie diet (via better food choices and also portion control or program like weight watchers) and exercise building up to or more than 30 minutes 5 days per week including some aerobic activity         Visit for routine gyn exam    Routine, no abn Plan to continue 1/20 OC  No  plans for pregnancy at this time        Relevant Orders   Cytology - PAP([place])    Other Visit Diagnoses    Encounter for initial  prescription of contraceptive pills       Relevant Medications   norethindrone-ethinyl estradiol (BLISOVI FE 1/20) 1-20 MG-MCG tablet

## 2020-06-07 NOTE — Patient Instructions (Addendum)
You can get your booster for covid any time   For general diet  Try to get most of your carbohydrates from produce (with the exception of white potatoes)  Eat less bread/pasta/rice/snack foods/cereals/sweets and other items from the middle of the grocery store (processed carbs)  Take care of yourself   If you decide to try and get pregnant - let us know and hold your atorvastatin

## 2020-06-09 NOTE — Assessment & Plan Note (Signed)
Disc goals for lipids and reasons to control them Rev last labs with pt Rev low sat fat diet in detail LDL is down to 96 Plan to continue atorvastatin 20 mg daily (pt will stop this if she tries for pregnancy in the future

## 2020-06-09 NOTE — Assessment & Plan Note (Signed)
Routine, no abn Plan to continue 1/20 OC  No plans for pregnancy at this time

## 2020-06-09 NOTE — Assessment & Plan Note (Signed)
Reviewed health habits including diet and exercise and skin cancer prevention Reviewed appropriate screening tests for age  Also reviewed health mt list, fam hx and immunization status , as well as social and family history   See HPI Labs reviewed  Encouraged the covid booster  Pap/gyn exam done

## 2020-06-09 NOTE — Assessment & Plan Note (Signed)
Encourage good self care More exercise since working at home Plan to continue wellbutrin xl 150 mg daily  Xanax prn-minimize use

## 2020-06-09 NOTE — Assessment & Plan Note (Signed)
Discussed how this problem influences overall health and the risks it imposes  Reviewed plan for weight loss with lower calorie diet (via better food choices and also portion control or program like weight watchers) and exercise building up to or more than 30 minutes 5 days per week including some aerobic activity    

## 2020-06-12 LAB — CYTOLOGY - PAP
Adequacy: ABSENT
Comment: NEGATIVE
Diagnosis: NEGATIVE
High risk HPV: NEGATIVE

## 2020-09-03 ENCOUNTER — Other Ambulatory Visit: Payer: Self-pay | Admitting: Family Medicine

## 2020-09-03 NOTE — Telephone Encounter (Signed)
Name of Medication: xanax Name of Pharmacy: [facility]  Last Fill or Written Date and Quantity: 12/22/19 #30 tabs 0 refills Last Office Visit and Type: CPE 06/07/20 Next Office Visit and Type: none scheduled    zofran filled on 09/23/18 #30 tabs/ 3 refill  imitrex filled on 09/23/18 #10 tabs/ 5 efills

## 2020-09-16 ENCOUNTER — Encounter: Payer: Self-pay | Admitting: Family Medicine

## 2020-10-21 ENCOUNTER — Other Ambulatory Visit: Payer: Self-pay | Admitting: Family Medicine

## 2020-10-21 ENCOUNTER — Other Ambulatory Visit (INDEPENDENT_AMBULATORY_CARE_PROVIDER_SITE_OTHER): Payer: No Typology Code available for payment source

## 2020-10-21 ENCOUNTER — Telehealth (INDEPENDENT_AMBULATORY_CARE_PROVIDER_SITE_OTHER): Payer: No Typology Code available for payment source | Admitting: Family Medicine

## 2020-10-21 VITALS — Wt 215.0 lb

## 2020-10-21 DIAGNOSIS — J020 Streptococcal pharyngitis: Secondary | ICD-10-CM

## 2020-10-21 DIAGNOSIS — J029 Acute pharyngitis, unspecified: Secondary | ICD-10-CM

## 2020-10-21 DIAGNOSIS — Z20822 Contact with and (suspected) exposure to covid-19: Secondary | ICD-10-CM

## 2020-10-21 LAB — POCT RAPID STREP A (OFFICE): Rapid Strep A Screen: POSITIVE — AB

## 2020-10-21 MED ORDER — PENICILLIN V POTASSIUM 500 MG PO TABS: 500.0000 mg | ORAL_TABLET | Freq: Two times a day (BID) | ORAL | 0 refills | Status: AC

## 2020-10-21 NOTE — Progress Notes (Signed)
re   I connected with Yvonne Trujillo on 10/21/20 at  9:00 AM EDT by video and verified that I am speaking with the correct person using two identifiers.   I discussed the limitations, risks, security and privacy concerns of performing an evaluation and management service by video and the availability of in person appointments. I also discussed with the patient that there may be a patient responsible charge related to this service. The patient expressed understanding and agreed to proceed.  Patient location: Home Provider Location: [facility] Participants: Lynnda Child and Marlou Sa Eskridge   Subjective:     Yvonne Trujillo is a 35 y.o. female presenting for Headache (X 1 day ) and Sore Throat (X 1 day )     Headache  Pertinent negatives include no coughing, fever, nausea, rhinorrhea or vomiting.  Sore Throat  Associated symptoms include headaches. Pertinent negatives include no congestion, coughing, diarrhea or vomiting.   #Headache - started on 6/19 - no spots on throat  - no cough, fever - sore throat - no known sick contact - has been at family gatherings 15-20 ppl - covid vaccine w/o booster - Had covid of Jan 2022 - treatment ibuprofen w/ improvement - does not completely resolve symptoms - endorses  some swollen lymph nodes   Review of Systems  Constitutional:  Negative for chills and fever.  HENT:  Negative for congestion, postnasal drip and rhinorrhea.   Eyes:  Negative for itching.  Respiratory:  Negative for cough.   Gastrointestinal:  Negative for diarrhea, nausea and vomiting.  Musculoskeletal:  Negative for arthralgias and myalgias.  Neurological:  Positive for headaches.    Social History   Tobacco Use  Smoking Status Former   Years: 6.00   Pack years: 0.00   Types: Cigarettes  Smokeless Tobacco Never        Objective:   BP Readings from Last 3 Encounters:  06/07/20 122/64  01/09/20 111/78  01/04/20 122/78   Wt Readings from Last 3  Encounters:  10/21/20 215 lb (97.5 kg)  06/07/20 214 lb 2 oz (97.1 kg)  01/09/20 213 lb 4 oz (96.7 kg)   Wt 215 lb (97.5 kg)   LMP 10/01/2020 (Exact Date)   BMI 40.62 kg/m   Physical Exam Constitutional:      Appearance: Normal appearance. She is not ill-appearing.  HENT:     Head: Normocephalic and atraumatic.     Right Ear: External ear normal.     Left Ear: External ear normal.  Eyes:     Conjunctiva/sclera: Conjunctivae normal.  Pulmonary:     Effort: Pulmonary effort is normal. No respiratory distress.  Neurological:     Mental Status: She is alert. Mental status is at baseline.  Psychiatric:        Mood and Affect: Mood normal.        Behavior: Behavior normal.        Thought Content: Thought content normal.        Judgment: Judgment normal.     Strept: positive       Assessment & Plan:   Problem List Items Addressed This Visit       Other   Morbid obesity (HCC)   Other Visit Diagnoses     Strep throat    -  Primary   Relevant Medications   penicillin v potassium (VEETID) 500 MG tablet   Sore throat           Rapid strept  positive - treat with penicillin V  Discussed reasonable to still rule out covid - results pending Continue isolation Symptomatic care  Discussed due to morbid obesity pt is high risk for covid and if positive would want treatment. She will hold atorvastatin until Covid results have returned.   Return if symptoms worsen or fail to improve.  Lynnda Child, MD

## 2020-10-22 ENCOUNTER — Encounter: Payer: Self-pay | Admitting: Family Medicine

## 2020-10-22 DIAGNOSIS — T3695XA Adverse effect of unspecified systemic antibiotic, initial encounter: Secondary | ICD-10-CM

## 2020-10-22 LAB — SARS-COV-2, NAA 2 DAY TAT

## 2020-10-22 LAB — NOVEL CORONAVIRUS, NAA: SARS-CoV-2, NAA: NOT DETECTED

## 2020-10-23 MED ORDER — FLUCONAZOLE 150 MG PO TABS: 150.0000 mg | ORAL_TABLET | Freq: Once | ORAL | 0 refills | Status: AC

## 2021-01-03 ENCOUNTER — Encounter: Payer: Self-pay | Admitting: Family Medicine

## 2021-01-03 MED ORDER — FLUCONAZOLE 150 MG PO TABS: 150.0000 mg | ORAL_TABLET | Freq: Once | ORAL | 0 refills | Status: AC

## 2021-03-24 ENCOUNTER — Telehealth (INDEPENDENT_AMBULATORY_CARE_PROVIDER_SITE_OTHER): Payer: No Typology Code available for payment source | Admitting: Family Medicine

## 2021-03-24 ENCOUNTER — Encounter: Payer: Self-pay | Admitting: Family Medicine

## 2021-03-24 VITALS — Ht 61.0 in

## 2021-03-24 DIAGNOSIS — J019 Acute sinusitis, unspecified: Secondary | ICD-10-CM

## 2021-03-24 DIAGNOSIS — H1033 Unspecified acute conjunctivitis, bilateral: Secondary | ICD-10-CM

## 2021-03-24 MED ORDER — AMOXICILLIN 875 MG PO TABS: 875.0000 mg | ORAL_TABLET | Freq: Two times a day (BID) | ORAL | 0 refills | Status: DC

## 2021-03-24 MED ORDER — POLYMYXIN B-TRIMETHOPRIM 10000-0.1 UNIT/ML-% OP SOLN: 1.0000 [drp] | OPHTHALMIC | 0 refills | Status: AC

## 2021-03-24 MED ORDER — FLUCONAZOLE 150 MG PO TABS: ORAL_TABLET | ORAL | 0 refills | Status: DC

## 2021-03-24 NOTE — Progress Notes (Signed)
Celese Banner T. Andi Mahaffy, MD Primary Care and Sports Medicine [facility] at [facility] [address] [place], [address] Phone: [phone]  FAX: [phone]  Yvonne Trujillo - 35 y.o. female  MRN [id number]  Date of Birth: [birth year]  Visit Date: 03/24/2021  PCP: Judy Pimple, MD  Referred by: Tower, Audrie Gallus, MD  Virtual Visit via Video Note:  I connected with  Yvonne Trujillo on 03/24/2021  3:20 PM EST by a video enabled telemedicine application and verified that I am speaking with the correct person using two identifiers.   Location patient: home computer, tablet, or smartphone Location provider: work or home office Consent: Verbal consent directly obtained from [employer]. Persons participating in the virtual visit: patient, provider  I discussed the limitations of evaluation and management by telemedicine and the availability of in person appointments. The patient expressed understanding and agreed to proceed.  Chief Complaint  Patient presents with   Facial Pain    [phone]    Sore Throat   Conjunctivitis    History of Present Illness:  Pleasant young lady who has had some sore throat, nasal congestion and some cough over the last 3 days.  Predominant major.  Facial pain.  She also woke up with some matting on her eyes this morning and some pinkish coloration to the conjunctivo-.  She denies fever.  She denies polyarthralgia or myalgia. She denies GI symptoms. She denies any other focal acute symptoms.  She has tried some decongestants and other over-the-counter remedies without limited success.  Sinus pain and pressure Decong A few days ago ST  Pink eye -  No covid test done  Review of Systems as above: See pertinent positives and pertinent negatives per HPI No acute distress verbally   Observations/Objective/Exam:  An attempt was made to discern vital signs over the phone and per patient if applicable and possible.    General:    Alert, Oriented, appears well and in no acute distress  Pulmonary:     On inspection no signs of respiratory distress.  Psych / Neurological:     Pleasant and cooperative.  Assessment and Plan:    ICD-10-CM   1. Acute rhinosinusitis  J01.90     2. Acute conjunctivitis of both eyes, unspecified acute conjunctivitis type  H10.33      First, recommended that she take a home COVID test.  At 3 days it should be accurate.  Hold oral antibiotics to see how she does over the next few days.  If she is improving, no need.  Polytrim for conjunctivitis.  I discussed the assessment and treatment plan with the patient. The patient was provided an opportunity to ask questions and all were answered. The patient agreed with the plan and demonstrated an understanding of the instructions.   The patient was advised to call back or seek an in-person evaluation if the symptoms worsen or if the condition fails to improve as anticipated.  Follow-up: prn unless noted otherwise below No follow-ups on file.  Meds ordered this encounter  Medications   trimethoprim-polymyxin b (POLYTRIM) ophthalmic solution    Sig: Place 1 drop into the left eye every 4 (four) hours for 7 days.    Dispense:  10 mL    Refill:  0   amoxicillin (AMOXIL) 875 MG tablet    Sig: Take 1 tablet (875 mg total) by mouth 2 (two) times daily.    Dispense:  20 tablet  Refill:  0   fluconazole (DIFLUCAN) 150 MG tablet    Sig: Take 1 tab po today, repeat in 7 days if needed    Dispense:  2 tablet    Refill:  0   No orders of the defined types were placed in this encounter.   Signed,  Elpidio Galea. Peja Allender, MD

## 2021-06-12 ENCOUNTER — Other Ambulatory Visit: Payer: Self-pay | Admitting: Family Medicine

## 2021-06-12 DIAGNOSIS — Z30011 Encounter for initial prescription of contraceptive pills: Secondary | ICD-10-CM

## 2021-06-13 NOTE — Telephone Encounter (Signed)
Pt is overdue for her CPE/ labs prior, please schedule appts and then route back to me to refill meds

## 2021-10-01 ENCOUNTER — Other Ambulatory Visit: Payer: Self-pay | Admitting: Family Medicine

## 2021-10-01 NOTE — Telephone Encounter (Signed)
Please sched annual exam this summer

## 2021-10-01 NOTE — Telephone Encounter (Signed)
Pt's had a few acute appts but last CPE was 06/07/20 and no future appts

## 2021-10-02 NOTE — Telephone Encounter (Signed)
Letter mailed letting pt know appt is due ?

## 2021-10-20 ENCOUNTER — Encounter (HOSPITAL_BASED_OUTPATIENT_CLINIC_OR_DEPARTMENT_OTHER): Payer: Self-pay | Admitting: Urology

## 2021-10-20 ENCOUNTER — Emergency Department (HOSPITAL_BASED_OUTPATIENT_CLINIC_OR_DEPARTMENT_OTHER)
Admission: EM | Admit: 2021-10-20 | Discharge: 2021-10-20 | Disposition: A | Discharge status: Home / Self Care | Payer: Commercial Managed Care - HMO | Attending: Emergency Medicine | Admitting: Emergency Medicine

## 2021-10-20 ENCOUNTER — Other Ambulatory Visit: Payer: Self-pay

## 2021-10-20 ENCOUNTER — Other Ambulatory Visit (HOSPITAL_BASED_OUTPATIENT_CLINIC_OR_DEPARTMENT_OTHER): Payer: Self-pay

## 2021-10-20 DIAGNOSIS — R197 Diarrhea, unspecified: Secondary | ICD-10-CM | POA: Diagnosis not present

## 2021-10-20 DIAGNOSIS — Z7951 Long term (current) use of inhaled steroids: Secondary | ICD-10-CM | POA: Diagnosis not present

## 2021-10-20 DIAGNOSIS — J45909 Unspecified asthma, uncomplicated: Secondary | ICD-10-CM | POA: Insufficient documentation

## 2021-10-20 DIAGNOSIS — R1011 Right upper quadrant pain: Secondary | ICD-10-CM | POA: Insufficient documentation

## 2021-10-20 DIAGNOSIS — R110 Nausea: Secondary | ICD-10-CM | POA: Diagnosis not present

## 2021-10-20 DIAGNOSIS — R1013 Epigastric pain: Secondary | ICD-10-CM | POA: Diagnosis present

## 2021-10-20 LAB — COMPREHENSIVE METABOLIC PANEL
ALT: 12 U/L (ref 0–44)
AST: 13 U/L — ABNORMAL LOW (ref 15–41)
Albumin: 3.8 g/dL (ref 3.5–5.0)
Alkaline Phosphatase: 72 U/L (ref 38–126)
Anion gap: 8 (ref 5–15)
BUN: 12 mg/dL (ref 6–20)
CO2: 23 mmol/L (ref 22–32)
Calcium: 8.9 mg/dL (ref 8.9–10.3)
Chloride: 104 mmol/L (ref 98–111)
Creatinine, Ser: 0.92 mg/dL (ref 0.44–1.00)
GFR, Estimated: >60 mL/min (ref >60)
Glucose, Bld: 99 mg/dL (ref 70–99)
Potassium: 4.2 mmol/L (ref 3.5–5.1)
Sodium: 135 mmol/L (ref 135–145)
Total Bilirubin: 0.3 mg/dL (ref 0.3–1.2)
Total Protein: 7.5 g/dL (ref 6.5–8.1)

## 2021-10-20 LAB — URINALYSIS, ROUTINE W REFLEX MICROSCOPIC
Bilirubin Urine: NEGATIVE
Glucose, UA: NEGATIVE mg/dL
Ketones, ur: NEGATIVE mg/dL
Leukocytes,Ua: NEGATIVE
Nitrite: NEGATIVE
Protein, ur: NEGATIVE mg/dL
Specific Gravity, Urine: 1.02 (ref 1.005–1.030)
pH: 6.5 (ref 5.0–8.0)

## 2021-10-20 LAB — CBC WITH DIFFERENTIAL/PLATELET
Abs Immature Granulocytes: 0.03 10*3/uL (ref 0.00–0.07)
Basophils Absolute: 0 10*3/uL (ref 0.0–0.1)
Basophils Relative: 0 %
Eosinophils Absolute: 0.1 10*3/uL (ref 0.0–0.5)
Eosinophils Relative: 2 %
HCT: 39.2 % (ref 36.0–46.0)
Hemoglobin: 13.1 g/dL (ref 12.0–15.0)
Immature Granulocytes: 0 %
Lymphocytes Relative: 32 %
Lymphs Abs: 2.8 10*3/uL (ref 0.7–4.0)
MCH: 29 pg (ref 26.0–34.0)
MCHC: 33.4 g/dL (ref 30.0–36.0)
MCV: 86.7 fL (ref 80.0–100.0)
Monocytes Absolute: 0.5 10*3/uL (ref 0.1–1.0)
Monocytes Relative: 6 %
Neutro Abs: 5.4 10*3/uL (ref 1.7–7.7)
Neutrophils Relative %: 60 %
Platelets: 416 10*3/uL — ABNORMAL HIGH (ref 150–400)
RBC: 4.52 MIL/uL (ref 3.87–5.11)
RDW: 12.7 % (ref 11.5–15.5)
WBC: 9 10*3/uL (ref 4.0–10.5)
nRBC: 0 % (ref 0.0–0.2)

## 2021-10-20 LAB — URINALYSIS, MICROSCOPIC (REFLEX)

## 2021-10-20 LAB — PREGNANCY, URINE: Preg Test, Ur: NEGATIVE

## 2021-10-20 LAB — LIPASE, BLOOD: Lipase: 28 U/L (ref 11–51)

## 2021-10-20 MED ORDER — OMEPRAZOLE 20 MG PO CPDR
40.0000 mg | DELAYED_RELEASE_CAPSULE | Freq: Every day | ORAL | 0 refills | Status: DC
  Filled 2021-10-20: qty 60, 30d supply, fill #0

## 2021-10-20 MED ORDER — DICYCLOMINE HCL 10 MG PO CAPS
10.0000 mg | ORAL_CAPSULE | Freq: Once | ORAL | Status: AC
  Administered 2021-10-20: 10 mg via ORAL
  Filled 2021-10-20: qty 1

## 2021-10-20 MED ORDER — SUCRALFATE 1 G PO TABS
1.0000 g | ORAL_TABLET | Freq: Three times a day (TID) | ORAL | 0 refills | Status: DC
  Filled 2021-10-20: qty 28, 7d supply, fill #0

## 2021-10-20 MED ORDER — SUCRALFATE 1 GM/10ML PO SUSP
1.0000 g | Freq: Once | ORAL | Status: AC
  Administered 2021-10-20: 1 g via ORAL
  Filled 2021-10-20: qty 10

## 2021-10-20 MED ORDER — ALUM & MAG HYDROXIDE-SIMETH 200-200-20 MG/5ML PO SUSP
30.0000 mL | Freq: Once | ORAL | Status: AC
  Administered 2021-10-20: 30 mL via ORAL
  Filled 2021-10-20: qty 30

## 2021-10-20 MED ORDER — LIDOCAINE VISCOUS HCL 2 % MT SOLN
15.0000 mL | Freq: Once | OROMUCOSAL | Status: AC
  Administered 2021-10-20: 15 mL via ORAL
  Filled 2021-10-20: qty 15

## 2021-10-20 MED ORDER — ONDANSETRON HCL 4 MG/2ML IJ SOLN
4.0000 mg | Freq: Once | INTRAMUSCULAR | Status: AC
Start: 2021-10-20 — End: 2021-10-20
  Administered 2021-10-20: 4 mg via INTRAVENOUS
  Filled 2021-10-20: qty 2

## 2021-10-20 NOTE — ED Provider Notes (Signed)
[facility] EMERGENCY DEPARTMENT Provider Note   CSN: [id number] Arrival date & time: 10/20/21  0902     History PMH: kidney stones, asthma, appendectomy, HLD  Chief Complaint  Patient presents with   Abdominal Pain    Yvonne Trujillo is a 36 y.o. female. Presents emergency department with abdominal pain.  She says about 1 week ago she started having some diarrhea.  She also started noticing that she had epigastric abdominal pain that felt sharp.  She says this pain is came and went all week.  It seems to get worse after eating.  She says last night she started feeling more of a burning, gnawing sensation that radiated to her back.  She says today her pain is 4 out of 10. Pepto Bismol yesterday has made it better. She has associated nausea without vomiting.  She has a history of cholelithiasis but has not had her gallbladder removed.  She says this feels kind of like one of her prior gallbladder attacks but not as severe.  She also has a history of kidney stones, but does not think that this feels similar to that. She has had her appendix removed. No other abdominal surgeries. She is tolerating PO well at home, but has been hesitant to eat because it makes the symptoms worse.    Abdominal Pain Associated symptoms: diarrhea and nausea   Associated symptoms: no chest pain, no chills, no dysuria, no fever, no hematuria, no shortness of breath, no vaginal discharge and no vomiting        Home Medications Prior to Admission medications   Medication Sig Start Date End Date Taking? Authorizing Provider  omeprazole (PRILOSEC) 20 MG capsule Take 2 capsules (40 mg total) by mouth daily. 10/20/21 11/19/21 Yes Michaelanthony Kempton, Finis Bud, PA-C  sucralfate (CARAFATE) 1 g tablet Take 1 tablet (1 g total) by mouth 4 (four) times daily -  with meals and at bedtime for 7 days. 10/20/21 10/27/21 Yes Gerod Caligiuri, Finis Bud, PA-C  albuterol (VENTOLIN HFA) 108 (90 Base) MCG/ACT inhaler Inhale 2 puffs into the lungs  every 4 (four) hours as needed for wheezing or shortness of breath (cough, shortness of breath or wheezing.). 08/09/19   Bing Neighbors, FNP  ALPRAZolam Prudy Feeler) 0.5 MG tablet TAKE ONE TABLET TWICE DAILY AS NEEDED FOR ANXIETY 09/03/20   Tower, Audrie Gallus, MD  amoxicillin (AMOXIL) 875 MG tablet Take 1 tablet (875 mg total) by mouth 2 (two) times daily. 03/24/21   Copland, Karleen Hampshire, MD  atorvastatin (LIPITOR) 20 MG tablet Take 1 tablet (20 mg total) by mouth daily. 06/07/20   Tower, Audrie Gallus, MD  buPROPion (WELLBUTRIN XL) 150 MG 24 hr tablet TAKE 1 TABLET BY MOUTH ONCE DAILY 10/01/21   Tower, Audrie Gallus, MD  fluconazole (DIFLUCAN) 150 MG tablet Take 1 tab po today, repeat in 7 days if needed 03/24/21   Copland, Karleen Hampshire, MD  fluticasone (FLONASE) 50 MCG/ACT nasal spray Place 2 sprays into both nostrils daily. 12/20/18   Tower, Audrie Gallus, MD  norethindrone-ethinyl estradiol-FE (BLISOVI FE 1/20) 1-20 MG-MCG tablet TAKE 1 TABLET BY MOUTH ONCE DAILY 06/13/21   Tower, Audrie Gallus, MD  ondansetron (ZOFRAN) 4 MG tablet TAKE ONE TABLET EVERY EIGHT HOURS AS NEEDED FOR NAUSEA / VOMITING 09/03/20   Tower, Audrie Gallus, MD  SUMAtriptan (IMITREX) 100 MG tablet TAKE ONE TABLET EVERY 2 HOURS AS NEEDED FOR MIRGRAINE MAY REPEAT IN 2HR 09/03/20   Tower, Audrie Gallus, MD  valACYclovir (VALTREX) 1000 MG tablet Take 2  pills by mouth twice daily for 1 day for a cold sore 09/23/18   Tower, Audrie Gallus, MD      Allergies    Delsym [dextromethorphan]    Review of Systems   Review of Systems  Constitutional:  Negative for appetite change, chills and fever.  Respiratory:  Negative for shortness of breath.   Cardiovascular:  Negative for chest pain and leg swelling.  Gastrointestinal:  Positive for abdominal pain, diarrhea and nausea. Negative for abdominal distention, blood in stool and vomiting.  Genitourinary:  Negative for dysuria, flank pain, hematuria, pelvic pain and vaginal discharge.  Musculoskeletal:  Positive for back pain.  All other systems  reviewed and are negative.   Physical Exam Updated Vital Signs BP 100/73   Pulse 87   Temp 99.1 F (37.3 C) (Oral)   Resp 17   Ht 5\' 1"  (1.549 m)   Wt 95.3 kg   LMP 10/06/2021 (Approximate)   SpO2 99%   BMI 39.68 kg/m  Physical Exam Vitals and nursing note reviewed.  Constitutional:      General: She is not in acute distress.    Appearance: Normal appearance. She is well-developed. She is not ill-appearing, toxic-appearing or diaphoretic.  HENT:     Head: Normocephalic and atraumatic.     Nose: No nasal deformity.     Mouth/Throat:     Lips: Pink. No lesions.     Mouth: Mucous membranes are moist.     Pharynx: Oropharynx is clear.  Eyes:     General: Gaze aligned appropriately. No scleral icterus.       Right eye: No discharge.        Left eye: No discharge.     Conjunctiva/sclera: Conjunctivae normal.     Right eye: Right conjunctiva is not injected. No exudate or hemorrhage.    Left eye: Left conjunctiva is not injected. No exudate or hemorrhage. Pulmonary:     Effort: Pulmonary effort is normal. No respiratory distress.  Abdominal:     General: Abdomen is flat. There is no distension.     Palpations: Abdomen is soft.     Tenderness: There is abdominal tenderness in the right upper quadrant and epigastric area. There is left CVA tenderness. There is no right CVA tenderness, guarding or rebound. Negative signs include Murphy's sign, Rovsing's sign and McBurney's sign.     Comments: Mild tenderness to epigastric, RUQ, and L CVA. No guarding or rigidity. Negative Murphy.   Skin:    General: Skin is warm and dry.  Neurological:     Mental Status: She is alert and oriented to person, place, and time.  Psychiatric:        Mood and Affect: Mood normal.        Speech: Speech normal.        Behavior: Behavior normal. Behavior is cooperative.     ED Results / Procedures / Treatments   Labs (all labs ordered are listed, but only abnormal results are displayed) Labs  Reviewed  CBC WITH DIFFERENTIAL/PLATELET - Abnormal; Notable for the following components:      Result Value   Platelets 416 (*)    All other components within normal limits  COMPREHENSIVE METABOLIC PANEL - Abnormal; Notable for the following components:   AST 13 (*)    All other components within normal limits  URINALYSIS, ROUTINE W REFLEX MICROSCOPIC - Abnormal; Notable for the following components:   Hgb urine dipstick LARGE (*)    All other components within normal  limits  URINALYSIS, MICROSCOPIC (REFLEX) - Abnormal; Notable for the following components:   Bacteria, UA FEW (*)    All other components within normal limits  LIPASE, BLOOD  PREGNANCY, URINE    EKG None  Radiology No results found.  Procedures Procedures   Medications Ordered in ED Medications  ondansetron (ZOFRAN) injection 4 mg (4 mg Intravenous Given 10/20/21 1010)  alum & mag hydroxide-simeth (MAALOX/MYLANTA) 200-200-20 MG/5ML suspension 30 mL (30 mLs Oral Given 10/20/21 1015)    And  lidocaine (XYLOCAINE) 2 % viscous mouth solution 15 mL (15 mLs Oral Given 10/20/21 1015)  dicyclomine (BENTYL) capsule 10 mg (10 mg Oral Given 10/20/21 1009)  sucralfate (CARAFATE) 1 GM/10ML suspension 1 g (1 g Oral Given 10/20/21 1014)    ED Course/ Medical Decision Making/ A&P                           Medical Decision Making Amount and/or Complexity of Data Reviewed Labs: ordered.  Risk OTC drugs. Prescription drug management.    MDM  This is a 36 y.o. female who presents to the ED with epigastric abdominal pain The differential of this patient includes but is not limited to Gallbladder etiology (cholecystitis, cholangitis, biliary colic), GERD, Pancreatitis, PUD, Kidney stone, pyelonephritis Low suspicion for ACS, Dissection, and Perforation  My Impression, Plan, and ED Course:  Well appearing. Vitals stable. Mild tachycardia is baseline. GI cocktail and abdominal workup initiated. Will reassess.  I personally  ordered, reviewed, and interpreted all laboratory work and imaging and agree with radiologist interpretation. Results interpreted below: CBC without any leukocytosis or anemia.  Otherwise normal.  CMP is completely normal with normal LFTs and kidney function.  Lipase is normal.  Urinalysis shows large hemoglobin and few bacteria.  No evidence of a UTI.   Exam is reassuring.  Pain is improved after GI cocktail.  She continues to have no right upper quadrant tenderness or Murphy sign.  I doubt this is gallbladder etiology. Do not think we need [place].  Pancreatitis less likely with normal lipase.  UA does not suggest UTI/Pyelo. Doubt kidney stone given presentation.  Think this is most likely to be gastritis versus PUD is describing a burning sensation especially after eating.  We will start her on omeprazole and Carafate as needed.  She is to follow-up with her PCP.  She is also given gastroenterology referral.  Return precautions provided.  Charting Requirements Additional history is obtained from:  Independent historian External Records from outside source obtained and reviewed including: Reviewed prior CT imaging Social Determinants of Health:  none Pertinant PMH that complicates patient's illness: n/a  Patient Care Problems that were addressed during this visit: - Epigastric Abdominal Pain: Acute illness with systemic symptoms This patient was maintained on a cardiac monitor/telemetry. I personally viewed and interpreted the cardiac monitor which reveals an underlying rhythm of NSR Medications given in ED: GI cocktail, zofran Reevaluation of the patient after these medicines showed that the patient improved I have reviewed home medications and made changes accordingly. Started PPI and carafate Disposition: Discharge  Portions of this note were generated with Dragon dictation software. Dictation errors may occur despite best attempts at proofreading.    Final Clinical Impression(s) / ED  Diagnoses Final diagnoses:  Epigastric abdominal pain    Rx / DC Orders ED Discharge Orders          Ordered    omeprazole (PRILOSEC) 20 MG capsule  Daily  10/20/21 1204    sucralfate (CARAFATE) 1 g tablet  3 times daily with meals & bedtime        10/20/21 1204              Mida Cory, Finis Bud, PA-C 10/20/21 1212    Milagros Loll, MD 10/21/21 [phone]

## 2021-10-20 NOTE — Discharge Instructions (Signed)
Start taking omeprazole daily for about 1 month or until your PCP or gastroenterology doctor tells you to stop.  I have also prescribed you Carafate which you can take as needed for your pain. Please schedule a follow-up appointment with your primary care doctor this week. Please call and schedule an appointment with [facility] gastroenterology for further management.  If you develop worsening pain, nausea, vomiting, inability to tolerate oral medication, fevers, or other worsening symptoms please return to the emergency department.

## 2021-10-20 NOTE — ED Triage Notes (Signed)
Epigastric pain intermittent x 1 week, states nausea as well, reports mild HA and diarrhea  Denies fever Took omeprazole yesterday and it helped temp

## 2021-10-28 ENCOUNTER — Other Ambulatory Visit: Payer: Self-pay | Admitting: Family Medicine

## 2021-10-28 DIAGNOSIS — Z30011 Encounter for initial prescription of contraceptive pills: Secondary | ICD-10-CM

## 2021-10-28 NOTE — Telephone Encounter (Signed)
.  is

## 2021-11-13 ENCOUNTER — Ambulatory Visit (INDEPENDENT_AMBULATORY_CARE_PROVIDER_SITE_OTHER): Payer: Commercial Managed Care - HMO | Admitting: Family Medicine

## 2021-11-13 ENCOUNTER — Encounter: Payer: Self-pay | Admitting: Family Medicine

## 2021-11-13 VITALS — BP 108/60 | HR 116 | Ht 61.0 in | Wt 226.6 lb

## 2021-11-13 DIAGNOSIS — R350 Frequency of micturition: Secondary | ICD-10-CM

## 2021-11-13 DIAGNOSIS — F418 Other specified anxiety disorders: Secondary | ICD-10-CM | POA: Diagnosis not present

## 2021-11-13 DIAGNOSIS — L680 Hirsutism: Secondary | ICD-10-CM

## 2021-11-13 DIAGNOSIS — N3000 Acute cystitis without hematuria: Secondary | ICD-10-CM | POA: Insufficient documentation

## 2021-11-13 DIAGNOSIS — R1010 Upper abdominal pain, unspecified: Secondary | ICD-10-CM

## 2021-11-13 DIAGNOSIS — E7800 Pure hypercholesterolemia, unspecified: Secondary | ICD-10-CM

## 2021-11-13 DIAGNOSIS — N926 Irregular menstruation, unspecified: Secondary | ICD-10-CM

## 2021-11-13 DIAGNOSIS — L700 Acne vulgaris: Secondary | ICD-10-CM

## 2021-11-13 DIAGNOSIS — R5383 Other fatigue: Secondary | ICD-10-CM | POA: Insufficient documentation

## 2021-11-13 DIAGNOSIS — R5382 Chronic fatigue, unspecified: Secondary | ICD-10-CM | POA: Diagnosis not present

## 2021-11-13 LAB — COMPREHENSIVE METABOLIC PANEL
ALT: 11 U/L (ref 0–35)
AST: 13 U/L (ref 0–37)
Albumin: 3.8 g/dL (ref 3.5–5.2)
Alkaline Phosphatase: 80 U/L (ref 39–117)
BUN: 10 mg/dL (ref 6–23)
CO2: 24 mEq/L (ref 19–32)
Calcium: 9 mg/dL (ref 8.4–10.5)
Chloride: 104 mEq/L (ref 96–112)
Creatinine, Ser: 0.9 mg/dL (ref 0.40–1.20)
GFR: 83.71 mL/min (ref >60.00)
Glucose, Bld: 95 mg/dL (ref 70–99)
Potassium: 4.3 mEq/L (ref 3.5–5.1)
Sodium: 137 mEq/L (ref 135–145)
Total Bilirubin: 0.3 mg/dL (ref 0.2–1.2)
Total Protein: 7 g/dL (ref 6.0–8.3)

## 2021-11-13 LAB — LIPID PANEL
Cholesterol: 200 mg/dL (ref 0–200)
HDL: 42.4 mg/dL (ref >39.00)
LDL Cholesterol: 120 mg/dL — ABNORMAL HIGH (ref 0–99)
NonHDL: 157.11
Total CHOL/HDL Ratio: 5
Triglycerides: 184 mg/dL — ABNORMAL HIGH (ref 0.0–149.0)
VLDL: 36.8 mg/dL (ref 0.0–40.0)

## 2021-11-13 LAB — TSH: TSH: 2.42 u[IU]/mL (ref 0.35–5.50)

## 2021-11-13 LAB — POCT URINALYSIS DIP (CLINITEK)
Bilirubin, UA: NEGATIVE
Blood, UA: NEGATIVE
Glucose, UA: NEGATIVE mg/dL
Ketones, POC UA: NEGATIVE mg/dL
Nitrite, UA: NEGATIVE
Spec Grav, UA: 1.025 (ref 1.010–1.025)
Urobilinogen, UA: 0.2 E.U./dL
pH, UA: 6 (ref 5.0–8.0)

## 2021-11-13 LAB — HEMOGLOBIN A1C: Hgb A1c MFr Bld: 5.5 % (ref 4.6–6.5)

## 2021-11-13 MED ORDER — BUPROPION HCL ER (XL) 150 MG PO TB24
150.0000 mg | ORAL_TABLET | Freq: Every day | ORAL | 3 refills | Status: DC
Start: 2021-11-13 — End: 2022-11-24

## 2021-11-13 MED ORDER — ATORVASTATIN CALCIUM 20 MG PO TABS: 20.0000 mg | ORAL_TABLET | Freq: Every day | ORAL | 3 refills | Status: DC

## 2021-11-13 MED ORDER — FLUCONAZOLE 150 MG PO TABS: 150.0000 mg | ORAL_TABLET | Freq: Once | ORAL | 0 refills | Status: AC

## 2021-11-13 MED ORDER — CEPHALEXIN 500 MG PO CAPS: 500.0000 mg | ORAL_CAPSULE | Freq: Two times a day (BID) | ORAL | 0 refills | Status: DC

## 2021-11-13 NOTE — Progress Notes (Signed)
Subjective:    Patient ID: Yvonne Trujillo, female    DOB: November 21, 1985, 36 y.o.   MRN: [id number]  HPI Pt presents for urinary symptoms and chronic medical problems  Wt Readings from Last 3 Encounters:  11/13/21 226 lb 9.6 oz (102.8 kg)  10/20/21 210 lb (95.3 kg)  10/21/20 215 lb (97.5 kg)   42.82 kg/m  Trying to eat less and better  More water  Exercise- 3 times daily at least 30 minutes (has a young child)    1-2 days ago  Frequency  No burning or pain  Some odor  No blood   Gave a sample- some leukocytes  Need more for cx   Results for orders placed or performed in visit on 11/13/21  POCT URINALYSIS DIP (CLINITEK)  Result Value Ref Range   Color, UA yellow yellow   Clarity, UA clear clear   Glucose, UA negative negative mg/dL   Bilirubin, UA negative negative   Ketones, POC UA negative negative mg/dL   Spec Grav, UA 6.222 9.798 - 1.025   Blood, UA negative negative   pH, UA 6.0 5.0 - 8.0   POC PROTEIN,UA trace negative, trace   Urobilinogen, UA 0.2 0.2 or 1.0 E.U./dL   Nitrite, UA Negative Negative   Leukocytes, UA Trace (A) Negative     Px omeprazole 40 mg  from ER for gastritis along with carafate  Has had this issue before  Not gallbladder  Improved  Had referral with GI-has not made appt yet   Mood Depression/anxiety  Wellbutrin xl 150 mg  Xanax prn  Migraine - better    ? If she has pcos Menses is regular on OC but she missed a period last month  Neg preg test / was stressed      Lab Results  Component Value Date   WBC 9.0 10/20/2021   HGB 13.1 10/20/2021   HCT 39.2 10/20/2021   MCV 86.7 10/20/2021   PLT 416 (H) 10/20/2021    Hyperlipidemia Lab Results  Component Value Date   CHOL 165 05/28/2020   HDL 38.80 (L) 05/28/2020   LDLCALC 96 05/28/2020   LDLDIRECT 223.2 04/24/2013   TRIG 152.0 (H) 05/28/2020   CHOLHDL 4 05/28/2020   Due for labs  Takes atorvastatin  Trying to do better with diet   Lab Results  Component Value  Date   TSH 4.22 05/28/2020     BP Readings from Last 3 Encounters:  11/13/21 108/60  10/20/21 100/73  06/07/20 122/64   Patient Active Problem List   Diagnosis Date Noted   Fatigue 11/13/2021   Acute cystitis 11/13/2021   Irregular menses 11/13/2021   Visit for routine gyn exam 06/07/2020   Pain of upper abdomen 01/04/2020   Nasal congestion 12/20/2018   Morbid obesity (HCC) 12/20/2018   History of gestational hypertension 01/15/2018   Herpes simplex 01/18/2015   Hyperlipidemia 04/25/2013   Migraine 04/24/2013   Depression with anxiety 04/24/2013   Hirsutism 04/24/2013   Acne 04/24/2013   History of kidney stones 04/24/2013   Routine general medical examination at a health care facility 04/24/2013   Past Medical History:  Diagnosis Date   Asthma    Concussion    History of depression    Kidney infection    Kidney stone    Migraine    Supervision of normal first pregnancy, antepartum 06/23/2017    Clinic  [facility] Prenatal Labs Dating  Blood type:    Genetic Screen 1 Screen:  AFP:     Quad:     NIPS: Antibody:  Anatomic [place]  Rubella:   GTT Early:               Third trimester:  RPR:    Flu vaccine  HBsAg:    TDaP vaccine                                               Rhogam: HIV:   Baby Food        Breast                                        GBS: (For PCN allergy, check sensitivities) Cont   Past Surgical History:  Procedure Laterality Date   APPENDECTOMY     EXTRACORPOREAL SHOCK WAVE LITHOTRIPSY Left 07/09/2016   Procedure: EXTRACORPOREAL SHOCK WAVE LITHOTRIPSY (ESWL);  Surgeon: Vanna Scotland, MD;  Location: [facility] ORS;  Service: Urology;  Laterality: Left;   Social History   Tobacco Use   Smoking status: Former    Years: 6.00    Types: Cigarettes   Smokeless tobacco: Never  Substance Use Topics   Alcohol use: Not Currently    Alcohol/week: 0.0 standard drinks of alcohol    Comment: rare   Drug use: Yes    Comment: occ   Family History  Problem Relation Age of  Onset   Alcohol abuse Maternal Uncle    Hyperlipidemia Mother    Hypertension Mother    Heart disease Mother    Hyperlipidemia Father    Hypertension Father    Diabetes Cousin    Bladder Cancer Neg Hx    Kidney cancer Neg Hx    Allergies  Allergen Reactions   Delsym [Dextromethorphan]     Makes her feel bad   Current Outpatient Medications on File Prior to Visit  Medication Sig Dispense Refill   albuterol (VENTOLIN HFA) 108 (90 Base) MCG/ACT inhaler Inhale 2 puffs into the lungs every 4 (four) hours as needed for wheezing or shortness of breath (cough, shortness of breath or wheezing.). 18 g 3   ALPRAZolam (XANAX) 0.5 MG tablet TAKE ONE TABLET TWICE DAILY AS NEEDED FOR ANXIETY 30 tablet 0   BLISOVI FE 1/20 1-20 MG-MCG tablet TAKE 1 TABLET BY MOUTH ONCE DAILY 84 tablet 0   fluticasone (FLONASE) 50 MCG/ACT nasal spray Place 2 sprays into both nostrils daily. 16 g 6   omeprazole (PRILOSEC) 20 MG capsule Take 2 capsules (40 mg total) by mouth daily. 60 capsule 0   ondansetron (ZOFRAN) 4 MG tablet TAKE ONE TABLET EVERY EIGHT HOURS AS NEEDED FOR NAUSEA / VOMITING 30 tablet 1   SUMAtriptan (IMITREX) 100 MG tablet TAKE ONE TABLET EVERY 2 HOURS AS NEEDED FOR MIRGRAINE MAY REPEAT IN 2HR 10 tablet 11   valACYclovir (VALTREX) 1000 MG tablet Take 2 pills by mouth twice daily for 1 day for a cold sore 4 tablet 3   No current facility-administered medications on file prior to visit.    Review of Systems  Constitutional:  Negative for activity change, appetite change, fatigue, fever and unexpected weight change.  HENT:  Negative for congestion, ear pain, rhinorrhea, sinus pressure and sore throat.   Eyes:  Negative for pain, redness and visual disturbance.  Respiratory:  Negative for cough, shortness of breath and wheezing.   Cardiovascular:  Negative for chest pain and palpitations.  Gastrointestinal:  Negative for abdominal pain, blood in stool, constipation and diarrhea.  Endocrine: Negative  for polydipsia and polyuria.  Genitourinary:  Negative for dysuria, frequency and urgency.  Musculoskeletal:  Negative for arthralgias, back pain and myalgias.  Skin:  Negative for pallor and rash.  Allergic/Immunologic: Negative for environmental allergies.  Neurological:  Negative for dizziness, syncope and headaches.  Hematological:  Negative for adenopathy. Does not bruise/bleed easily.  Psychiatric/Behavioral:  Negative for decreased concentration and dysphoric mood. The patient is not nervous/anxious.        Objective:   Physical Exam Constitutional:      General: She is not in acute distress.    Appearance: Normal appearance. She is well-developed. She is obese. She is not ill-appearing or diaphoretic.  HENT:     Head: Normocephalic and atraumatic.     Mouth/Throat:     Mouth: Mucous membranes are moist.  Eyes:     General: No scleral icterus.    Conjunctiva/sclera: Conjunctivae normal.     Pupils: Pupils are equal, round, and reactive to light.  Neck:     Thyroid: No thyromegaly.     Vascular: No carotid bruit or JVD.  Cardiovascular:     Rate and Rhythm: Regular rhythm. Tachycardia present.     Heart sounds: Normal heart sounds.     No gallop.  Pulmonary:     Effort: Pulmonary effort is normal. No respiratory distress.     Breath sounds: Normal breath sounds. No wheezing or rales.  Abdominal:     General: There is no distension or abdominal bruit.     Palpations: Abdomen is soft.     Tenderness: There is no abdominal tenderness. There is no guarding.     Comments: No suprapubic tenderness or fullness    Musculoskeletal:     Cervical back: Normal range of motion and neck supple.     Right lower leg: No edema.     Left lower leg: No edema.  Lymphadenopathy:     Cervical: No cervical adenopathy.  Skin:    General: Skin is warm and dry.     Coloration: Skin is not pale.     Findings: No rash.  Neurological:     Mental Status: She is alert.     Coordination:  Coordination normal.     Deep Tendon Reflexes: Reflexes are normal and symmetric. Reflexes normal.  Psychiatric:        Mood and Affect: Mood normal.           Assessment & Plan:   Problem List Items Addressed This Visit       Musculoskeletal and Integument   Acne    Hormonal distribution  ? If pt may have PCOS Aldactone may be tx option  Takes OC       Relevant Medications   cephALEXin (KEFLEX) 500 MG capsule   Other Relevant Orders   Ambulatory referral to Gynecology   Hirsutism    ? Possible pcos      Relevant Orders   Ambulatory referral to Gynecology     Genitourinary   Acute cystitis    Leuk on ua  tx with keflex Pending culture  inst to call if symptoms worsen in meantime   Encouraged good fluid intake      Relevant Orders   Urine Culture     Other   Depression with anxiety  Worsened depressive symptoms recently and tearfulness and some lack of motivation  Reviewed stressors/ coping techniques/symptoms/ support sources/ tx options and side effects in detail today  Will try inc wellbutrin xr to 300 mg at home to see if tolerated and if so we will go up on dose Encouraged counseling as well as self care      Relevant Medications   buPROPion (WELLBUTRIN XL) 150 MG 24 hr tablet   Fatigue    Labs today   Some worsening depression and lack of motivation       Relevant Orders   Comprehensive metabolic panel (Completed)   Hemoglobin A1c (Completed)   Lipid panel (Completed)   TSH (Completed)   Hyperlipidemia    Disc goals for lipids and reasons to control them Rev last labs with pt Rev low sat fat diet in detail Lab today  Taking atorvastatin 20 mg daily with fair diet       Relevant Medications   atorvastatin (LIPITOR) 20 MG tablet   Irregular menses    With hirsute issues, acne, obesity  Poss pcos (also ovarian cysts in the past)   May benefit from metformin or aldactone if so  Ref to gyn for evaluation  Is on OC a1c ordered           Relevant Orders   Ambulatory referral to Gynecology   Morbid obesity ([place])    Discussed how this problem influences overall health and the risks it imposes  Reviewed plan for weight loss with lower calorie diet (via better food choices and also portion control or program like weight watchers) and exercise building up to or more than 30 minutes 5 days per week including some aerobic activity   Pt is frustrated Do wonder if she may have pcos  Ref made to gyn  If so may benefit from metformin      Relevant Orders   Hemoglobin A1c (Completed)   Ambulatory referral to Gynecology   Pain of upper abdomen    Improved on omeprazole so far  Rev ER notes   Adv to go ahead and call for GI appt       Other Visit Diagnoses     Urine frequency    -  Primary   Relevant Orders   POCT URINALYSIS DIP (CLINITEK) (Completed)   Urine Culture

## 2021-11-13 NOTE — Assessment & Plan Note (Signed)
With hirsute issues, acne, obesity  Poss pcos (also ovarian cysts in the past)   May benefit from metformin or aldactone if so  Ref to gyn for evaluation  Is on OC a1c ordered

## 2021-11-13 NOTE — Assessment & Plan Note (Signed)
Worsened depressive symptoms recently and tearfulness and some lack of motivation  Reviewed stressors/ coping techniques/symptoms/ support sources/ tx options and side effects in detail today  Will try inc wellbutrin xr to 300 mg at home to see if tolerated and if so we will go up on dose Encouraged counseling as well as self care

## 2021-11-13 NOTE — Assessment & Plan Note (Addendum)
Labs today   Some worsening depression and lack of motivation

## 2021-11-13 NOTE — Assessment & Plan Note (Signed)
?   Possible pcos

## 2021-11-13 NOTE — Assessment & Plan Note (Signed)
Improved on omeprazole so far  Rev ER notes   Adv to go ahead and call for GI appt

## 2021-11-13 NOTE — Assessment & Plan Note (Signed)
Hormonal distribution  ? If pt may have PCOS Aldactone may be tx option  Takes OC

## 2021-11-13 NOTE — Assessment & Plan Note (Signed)
Leuk on ua  tx with keflex Pending culture  inst to call if symptoms worsen in meantime   Encouraged good fluid intake

## 2021-11-13 NOTE — Assessment & Plan Note (Signed)
Discussed how this problem influences overall health and the risks it imposes  Reviewed plan for weight loss with lower calorie diet (via better food choices and also portion control or program like weight watchers) and exercise building up to or more than 30 minutes 5 days per week including some aerobic activity   Pt is frustrated Do wonder if she may have pcos  Ref made to gyn  If so may benefit from metformin

## 2021-11-13 NOTE — Patient Instructions (Addendum)
Go ahead and make the GI appointment   If on omeprazole take B12 1000 mcg daily  Vitamin D 2000 iu daily    You can try taking 2 wellbutrin daily and let me know how it goes If you want to continue- then I will send in the higher dose (300)   I wonder if you may have PCOS  I sent a referral for gyn- if you don't get a call in a week please let us know   Labs today for cholesterol and sugar and thyroid   Take the keflex for uti   Follow up in 3 months

## 2021-11-13 NOTE — Assessment & Plan Note (Signed)
Disc goals for lipids and reasons to control them Rev last labs with pt Rev low sat fat diet in detail Lab today  Taking atorvastatin 20 mg daily with fair diet

## 2021-11-14 LAB — URINE CULTURE
MICRO NUMBER:: 13643101
Result:: NO GROWTH
SPECIMEN QUALITY:: ADEQUATE

## 2021-11-16 ENCOUNTER — Encounter: Payer: Self-pay | Admitting: Family Medicine

## 2021-11-19 NOTE — Telephone Encounter (Signed)
Called spoke with patient made her an appt for patient to come to at the 1st available, which is Monday, tried offer pt a sooner opening with another provider, she refused. She wanted to wait see Dr. Milinda Antis.

## 2021-11-20 ENCOUNTER — Encounter: Payer: Self-pay | Admitting: Primary Care

## 2021-11-20 ENCOUNTER — Ambulatory Visit (INDEPENDENT_AMBULATORY_CARE_PROVIDER_SITE_OTHER): Payer: Commercial Managed Care - HMO | Admitting: Primary Care

## 2021-11-20 ENCOUNTER — Ambulatory Visit
Admission: RE | Admit: 2021-11-20 | Discharge: 2021-11-20 | Disposition: A | Discharge status: Home / Self Care | Payer: Commercial Managed Care - HMO | Source: Ambulatory Visit | Attending: Primary Care | Admitting: Primary Care

## 2021-11-20 VITALS — BP 115/76 | HR 76 | Temp 98.6°F | Ht 61.0 in | Wt 227.0 lb

## 2021-11-20 DIAGNOSIS — R102 Pelvic and perineal pain: Secondary | ICD-10-CM | POA: Insufficient documentation

## 2021-11-20 LAB — CBC WITH DIFFERENTIAL/PLATELET
Basophils Absolute: 0 10*3/uL (ref 0.0–0.1)
Basophils Relative: 0.4 % (ref 0.0–3.0)
Eosinophils Absolute: 0.3 10*3/uL (ref 0.0–0.7)
Eosinophils Relative: 2.1 % (ref 0.0–5.0)
HCT: 38 % (ref 36.0–46.0)
Hemoglobin: 12.4 g/dL (ref 12.0–15.0)
Lymphocytes Relative: 29.3 % (ref 12.0–46.0)
Lymphs Abs: 3.6 10*3/uL (ref 0.7–4.0)
MCHC: 32.7 g/dL (ref 30.0–36.0)
MCV: 86.6 fl (ref 78.0–100.0)
Monocytes Absolute: 0.7 10*3/uL (ref 0.1–1.0)
Monocytes Relative: 5.4 % (ref 3.0–12.0)
Neutro Abs: 7.7 10*3/uL (ref 1.4–7.7)
Neutrophils Relative %: 62.8 % (ref 43.0–77.0)
Platelets: 416 10*3/uL — ABNORMAL HIGH (ref 150.0–400.0)
RBC: 4.39 Mil/uL (ref 3.87–5.11)
RDW: 13.1 % (ref 11.5–15.5)
WBC: 12.2 10*3/uL — ABNORMAL HIGH (ref 4.0–10.5)

## 2021-11-20 LAB — BASIC METABOLIC PANEL
BUN: 10 mg/dL (ref 6–23)
CO2: 23 mEq/L (ref 19–32)
Calcium: 8.7 mg/dL (ref 8.4–10.5)
Chloride: 103 mEq/L (ref 96–112)
Creatinine, Ser: 0.78 mg/dL (ref 0.40–1.20)
GFR: 99.38 mL/min (ref >60.00)
Glucose, Bld: 77 mg/dL (ref 70–99)
Potassium: 4.1 mEq/L (ref 3.5–5.1)
Sodium: 136 mEq/L (ref 135–145)

## 2021-11-20 LAB — POC URINALSYSI DIPSTICK (AUTOMATED)
Bilirubin, UA: NEGATIVE
Blood, UA: NEGATIVE
Glucose, UA: NEGATIVE
Ketones, UA: NEGATIVE
Leukocytes, UA: NEGATIVE
Nitrite, UA: NEGATIVE
Protein, UA: NEGATIVE
Spec Grav, UA: <=1.005 — AB (ref 1.010–1.025)
Urobilinogen, UA: 0.2 E.U./dL
pH, UA: 6.5 (ref 5.0–8.0)

## 2021-11-20 NOTE — Progress Notes (Signed)
Subjective:    Patient ID: Yvonne Trujillo, female    DOB: December 26, 1985, 36 y.o.   MRN: [id number]  Abdominal Pain Pertinent negatives include no constipation, diarrhea, dysuria, fever, frequency, hematuria, nausea or vomiting.    Yvonne Trujillo is a very pleasant 36 y.o. female patient of Dr. Milinda Antis with a history of acute cystitis, renal stones, herpes simplex, irregular menses, appendectomy who presents today to discuss pelvic pain.  Evaluated by PCP on 11/13/2021, and during this visit she endorsed a 1 to 2-day history of urinary frequency with mild odor, denied hematuria and dysuria.  Urinalysis revealed leukocytes so she was treated with cephalexin.  Urine culture returned without bacterial growth.  She updated PCP on 11/16/2021 stating that symptoms had improved but she developed right lower abdominal pain.  She was advised to return to the office for evaluation.  Today she endorses right pelvic pain that began three days ago. She completed her cephalexin three days ago. Her pain is constantly dull, with increased sharp pain intermittently, without radiation of pain. History of appendectomy.   She denies hematuria, dysuria, nausea, vomiting, constipation, diarrhea, radiation of pain, flank pain, vaginal discharge/itching. She took a home pregnancy test two weeks ago which was negative. She is compliant to her OCP's daily. She was recently referred by her PCP to GYN for evaluation of PCOS.    Review of Systems  Constitutional:  Negative for fever.  Gastrointestinal:  Negative for abdominal pain, constipation, diarrhea, nausea and vomiting.  Genitourinary:  Positive for pelvic pain. Negative for dysuria, flank pain, frequency, hematuria, urgency, vaginal bleeding and vaginal discharge.         Past Medical History:  Diagnosis Date   Asthma    Concussion    History of depression    Kidney infection    Kidney stone    Migraine    Supervision of normal first pregnancy, antepartum 06/23/2017     Clinic  [facility] Prenatal Labs Dating  Blood type:    Genetic Screen 1 Screen:    AFP:     Quad:     NIPS: Antibody:  Anatomic [place]  Rubella:   GTT Early:               Third trimester:  RPR:    Flu vaccine  HBsAg:    TDaP vaccine                                               Rhogam: HIV:   Baby Food        Breast                                        GBS: (For PCN allergy, check sensitivities) Cont    Social History   Socioeconomic History   Marital status: Single    Spouse name: Not on file   Number of children: Not on file   Years of education: Not on file   Highest education level: Not on file  Occupational History   Not on file  Tobacco Use   Smoking status: Former    Years: 6.00    Types: Cigarettes   Smokeless tobacco: Never  Substance and Sexual Activity   Alcohol use: Not Currently    Alcohol/week:  0.0 standard drinks of alcohol    Comment: rare   Drug use: Yes    Comment: occ   Sexual activity: Yes    Birth control/protection: None  Other Topics Concern   Not on file  Social History Narrative   Not on file   Social Determinants of Health   Financial Resource Strain: Low Risk  (01/15/2018)   Overall Financial Resource Strain (CARDIA)    Difficulty of Paying Living Expenses: Not hard at all  Food Insecurity: No Food Insecurity (01/15/2018)   Hunger Vital Sign    Worried About Running Out of Food in the Last Year: Never true    Ran Out of Food in the Last Year: Never true  Transportation Needs: No Transportation Needs (01/15/2018)   PRAPARE - Administrator, Civil Service (Medical): No    Lack of Transportation (Non-Medical): No  Physical Activity: Inactive (01/15/2018)   Exercise Vital Sign    Days of Exercise per Week: 0 days    Minutes of Exercise per Session: 0 min  Stress: No Stress Concern Present (01/15/2018)   [employer] of Occupational Health - Occupational Stress Questionnaire    Feeling of Stress : Not at all  Social Connections:  Moderately Integrated (01/15/2018)   Social Connection and Isolation Panel [NHANES]    Frequency of Communication with Friends and Family: More than three times a week    Frequency of Social Gatherings with Friends and Family: More than three times a week    Attends Religious Services: More than 4 times per year    Active Member of [employer] or Organizations: No    Attends Banker Meetings: Never    Marital Status: Living with partner  Intimate Partner Violence: Not At Risk (01/15/2018)   Humiliation, Afraid, Rape, and Kick questionnaire    Fear of Current or Ex-Partner: No    Emotionally Abused: No    Physically Abused: No    Sexually Abused: No    Past Surgical History:  Procedure Laterality Date   APPENDECTOMY     EXTRACORPOREAL SHOCK WAVE LITHOTRIPSY Left 07/09/2016   Procedure: EXTRACORPOREAL SHOCK WAVE LITHOTRIPSY (ESWL);  Surgeon: Vanna Scotland, MD;  Location: [facility] ORS;  Service: Urology;  Laterality: Left;    Family History  Problem Relation Age of Onset   Alcohol abuse Maternal Uncle    Hyperlipidemia Mother    Hypertension Mother    Heart disease Mother    Hyperlipidemia Father    Hypertension Father    Diabetes Cousin    Bladder Cancer Neg Hx    Kidney cancer Neg Hx     Allergies  Allergen Reactions   Delsym [Dextromethorphan]     Makes her feel bad    Current Outpatient Medications on File Prior to Visit  Medication Sig Dispense Refill   albuterol (VENTOLIN HFA) 108 (90 Base) MCG/ACT inhaler Inhale 2 puffs into the lungs every 4 (four) hours as needed for wheezing or shortness of breath (cough, shortness of breath or wheezing.). 18 g 3   ALPRAZolam (XANAX) 0.5 MG tablet TAKE ONE TABLET TWICE DAILY AS NEEDED FOR ANXIETY 30 tablet 0   atorvastatin (LIPITOR) 20 MG tablet Take 1 tablet (20 mg total) by mouth daily. 90 tablet 3   BLISOVI FE 1/20 1-20 MG-MCG tablet TAKE 1 TABLET BY MOUTH ONCE DAILY 84 tablet 0   buPROPion (WELLBUTRIN XL) 150 MG 24 hr tablet  Take 1 tablet (150 mg total) by mouth daily. 90 tablet 3  cephALEXin (KEFLEX) 500 MG capsule Take 1 capsule (500 mg total) by mouth 2 (two) times daily. 10 capsule 0   fluticasone (FLONASE) 50 MCG/ACT nasal spray Place 2 sprays into both nostrils daily. 16 g 6   ondansetron (ZOFRAN) 4 MG tablet TAKE ONE TABLET EVERY EIGHT HOURS AS NEEDED FOR NAUSEA / VOMITING 30 tablet 1   SUMAtriptan (IMITREX) 100 MG tablet TAKE ONE TABLET EVERY 2 HOURS AS NEEDED FOR MIRGRAINE MAY REPEAT IN 2HR 10 tablet 11   valACYclovir (VALTREX) 1000 MG tablet Take 2 pills by mouth twice daily for 1 day for a cold sore 4 tablet 3   omeprazole (PRILOSEC) 20 MG capsule Take 2 capsules (40 mg total) by mouth daily. 60 capsule 0   No current facility-administered medications on file prior to visit.    BP 115/76   Pulse 76   Temp 98.6 F (37 C) (Oral)   Ht 5\' 1"  (1.549 m)   Wt 227 lb (103 kg)   SpO2 98%   BMI 42.89 kg/m  Objective:   Physical Exam Constitutional:      General: She is not in acute distress.    Appearance: She is not ill-appearing.  Cardiovascular:     Rate and Rhythm: Normal rate and regular rhythm.  Pulmonary:     Effort: Pulmonary effort is normal.     Breath sounds: Normal breath sounds.  Abdominal:     General: Bowel sounds are normal.     Palpations: Abdomen is soft.     Tenderness: There is abdominal tenderness in the suprapubic area. There is no right CVA tenderness or left CVA tenderness.     Comments: Right pelvic tenderness   Musculoskeletal:     Cervical back: Neck supple.  Skin:    General: Skin is warm and dry.           Assessment & Plan:   Problem List Items Addressed This Visit       Other   Acute pelvic pain - Primary    Suspicious for ovarian cyst, need to rule out ectopic pregnancy or any other ovarian complication. UA today negative.   Checking CBC with diff and BMP.  Fortunately, she appeared stable on exam without distress.  Stat transvaginal/pelvic  ultrasound ordered and pending.  Await results.       Relevant Orders   PELVIC COMPLETE WITH TRANSVAGINAL   CBC with Differential/Platelet   Basic metabolic panel   POCT Urinalysis Dipstick (Automated)       [place], NP

## 2021-11-20 NOTE — Patient Instructions (Signed)
Stop by the lab prior to leaving today. I will notify you of your results once received.   You will be contacted regarding your ultrasound.  Please let us know if you have not been contacted within 24 hours.  It was a pleasure to see you today!

## 2021-11-20 NOTE — Assessment & Plan Note (Signed)
Suspicious for ovarian cyst, need to rule out ectopic pregnancy or any other ovarian complication. UA today negative.   Checking CBC with diff and BMP.  Fortunately, she appeared stable on exam without distress.  Stat transvaginal/pelvic ultrasound ordered and pending.  Await results.

## 2021-11-21 DIAGNOSIS — R102 Pelvic and perineal pain: Secondary | ICD-10-CM

## 2021-11-21 MED ORDER — CYCLOBENZAPRINE HCL 5 MG PO TABS: 5.0000 mg | ORAL_TABLET | Freq: Two times a day (BID) | ORAL | 0 refills | Status: DC | PRN

## 2021-11-24 ENCOUNTER — Ambulatory Visit: Payer: Commercial Managed Care - HMO | Admitting: Family Medicine

## 2022-01-13 ENCOUNTER — Emergency Department (HOSPITAL_BASED_OUTPATIENT_CLINIC_OR_DEPARTMENT_OTHER)
Admission: EM | Admit: 2022-01-13 | Discharge: 2022-01-13 | Disposition: A | Discharge status: Home / Self Care | Payer: Commercial Managed Care - HMO | Attending: Emergency Medicine | Admitting: Emergency Medicine

## 2022-01-13 ENCOUNTER — Other Ambulatory Visit: Payer: Self-pay

## 2022-01-13 ENCOUNTER — Encounter (HOSPITAL_BASED_OUTPATIENT_CLINIC_OR_DEPARTMENT_OTHER): Payer: Self-pay | Admitting: Emergency Medicine

## 2022-01-13 ENCOUNTER — Emergency Department (HOSPITAL_BASED_OUTPATIENT_CLINIC_OR_DEPARTMENT_OTHER)
Admission: RE | Admit: 2022-01-13 | Discharge: 2022-01-13 | Disposition: A | Discharge status: Home / Self Care | Payer: Commercial Managed Care - HMO | Source: Ambulatory Visit | Attending: Emergency Medicine | Admitting: Emergency Medicine

## 2022-01-13 DIAGNOSIS — E860 Dehydration: Secondary | ICD-10-CM | POA: Insufficient documentation

## 2022-01-13 DIAGNOSIS — K760 Fatty (change of) liver, not elsewhere classified: Secondary | ICD-10-CM | POA: Insufficient documentation

## 2022-01-13 DIAGNOSIS — Z87891 Personal history of nicotine dependence: Secondary | ICD-10-CM | POA: Insufficient documentation

## 2022-01-13 DIAGNOSIS — R1013 Epigastric pain: Secondary | ICD-10-CM | POA: Diagnosis not present

## 2022-01-13 DIAGNOSIS — J45909 Unspecified asthma, uncomplicated: Secondary | ICD-10-CM | POA: Diagnosis not present

## 2022-01-13 DIAGNOSIS — R1011 Right upper quadrant pain: Secondary | ICD-10-CM | POA: Insufficient documentation

## 2022-01-13 DIAGNOSIS — R112 Nausea with vomiting, unspecified: Secondary | ICD-10-CM

## 2022-01-13 LAB — COMPREHENSIVE METABOLIC PANEL
ALT: 13 U/L (ref 0–44)
AST: 16 U/L (ref 15–41)
Albumin: 3.3 g/dL — ABNORMAL LOW (ref 3.5–5.0)
Alkaline Phosphatase: 80 U/L (ref 38–126)
Anion gap: 6 (ref 5–15)
BUN: 11 mg/dL (ref 6–20)
CO2: 21 mmol/L — ABNORMAL LOW (ref 22–32)
Calcium: 8.3 mg/dL — ABNORMAL LOW (ref 8.9–10.3)
Chloride: 107 mmol/L (ref 98–111)
Creatinine, Ser: 0.93 mg/dL (ref 0.44–1.00)
GFR, Estimated: >60 mL/min (ref >60)
Glucose, Bld: 103 mg/dL — ABNORMAL HIGH (ref 70–99)
Potassium: 3.8 mmol/L (ref 3.5–5.1)
Sodium: 134 mmol/L — ABNORMAL LOW (ref 135–145)
Total Bilirubin: 0.7 mg/dL (ref 0.3–1.2)
Total Protein: 8 g/dL (ref 6.5–8.1)

## 2022-01-13 LAB — URINALYSIS, MICROSCOPIC (REFLEX)

## 2022-01-13 LAB — CBC
HCT: 41.7 % (ref 36.0–46.0)
Hemoglobin: 14.2 g/dL (ref 12.0–15.0)
MCH: 28.9 pg (ref 26.0–34.0)
MCHC: 34.1 g/dL (ref 30.0–36.0)
MCV: 84.9 fL (ref 80.0–100.0)
Platelets: 423 10*3/uL — ABNORMAL HIGH (ref 150–400)
RBC: 4.91 MIL/uL (ref 3.87–5.11)
RDW: 12.2 % (ref 11.5–15.5)
WBC: 11.8 10*3/uL — ABNORMAL HIGH (ref 4.0–10.5)
nRBC: 0 % (ref 0.0–0.2)

## 2022-01-13 LAB — LIPASE, BLOOD: Lipase: 27 U/L (ref 11–51)

## 2022-01-13 LAB — URINALYSIS, ROUTINE W REFLEX MICROSCOPIC
Bilirubin Urine: NEGATIVE
Glucose, UA: NEGATIVE mg/dL
Ketones, ur: NEGATIVE mg/dL
Leukocytes,Ua: NEGATIVE
Nitrite: NEGATIVE
Protein, ur: NEGATIVE mg/dL
Specific Gravity, Urine: >=1.03 (ref 1.005–1.030)
pH: 6 (ref 5.0–8.0)

## 2022-01-13 LAB — PREGNANCY, URINE: Preg Test, Ur: NEGATIVE

## 2022-01-13 MED ORDER — SODIUM CHLORIDE 0.9 % IV BOLUS
1000.0000 mL | Freq: Once | INTRAVENOUS | Status: AC
  Administered 2022-01-13: 1000 mL via INTRAVENOUS

## 2022-01-13 MED ORDER — ONDANSETRON HCL 4 MG/2ML IJ SOLN
4.0000 mg | Freq: Once | INTRAMUSCULAR | Status: AC
  Administered 2022-01-13: 4 mg via INTRAVENOUS
  Filled 2022-01-13: qty 2

## 2022-01-13 MED ORDER — FENTANYL CITRATE PF 50 MCG/ML IJ SOSY
50.0000 ug | PREFILLED_SYRINGE | Freq: Once | INTRAMUSCULAR | Status: AC
  Administered 2022-01-13: 50 ug via INTRAVENOUS
  Filled 2022-01-13: qty 1

## 2022-01-13 NOTE — ED Provider Notes (Signed)
[place] DEPT [facility] Provider Note: Georgena Spurling, MD, FACEP  CSN: [id number] MRN: [id number] ARRIVAL: 01/13/22 at [place]: [place]  Abdominal Pain   HISTORY OF PRESENT ILLNESS  01/13/22 2:59 AM Yvonne Trujillo is a 36 y.o. female with about 24 hours nausea, vomiting and diarrhea with epigastric and right upper quadrant abdominal pain.  The nausea and vomiting came first, followed by the pain, then the diarrhea.  She describes the abdominal pain is sharp and rates it as an 8 out of 10 at its worst but it is now improving.    Past Medical History:  Diagnosis Date   Asthma    Concussion    History of depression    Kidney infection    Kidney stone    Migraine    Supervision of normal first pregnancy, antepartum 06/23/2017    Clinic  [facility] Prenatal Labs Dating  Blood type:    Genetic Screen 1 Screen:    AFP:     Quad:     NIPS: Antibody:  Anatomic [place]  Rubella:   GTT Early:               Third trimester:  RPR:    Flu vaccine  HBsAg:    TDaP vaccine                                               Rhogam: HIV:   Baby Food        Breast                                        GBS: (For PCN allergy, check sensitivities) Cont    Past Surgical History:  Procedure Laterality Date   APPENDECTOMY     EXTRACORPOREAL SHOCK WAVE LITHOTRIPSY Left 07/09/2016   Procedure: EXTRACORPOREAL SHOCK WAVE LITHOTRIPSY (ESWL);  Surgeon: Hollice Espy, MD;  Location: [facility] ORS;  Service: Urology;  Laterality: Left;    Family History  Problem Relation Age of Onset   Alcohol abuse Maternal Uncle    Hyperlipidemia Mother    Hypertension Mother    Heart disease Mother    Hyperlipidemia Father    Hypertension Father    Diabetes Cousin    Bladder Cancer Neg Hx    Kidney cancer Neg Hx     Social History   Tobacco Use   Smoking status: Former    Years: 6.00    Types: Cigarettes   Smokeless tobacco: Never  Vaping Use   Vaping Use: Never used  Substance Use Topics   Alcohol use:  Yes    Comment: occ   Drug use: Not Currently    Comment: occ    Prior to Admission medications   Medication Sig Start Date End Date Taking? Authorizing Provider  albuterol (VENTOLIN HFA) 108 (90 Base) MCG/ACT inhaler Inhale 2 puffs into the lungs every 4 (four) hours as needed for wheezing or shortness of breath (cough, shortness of breath or wheezing.). 08/09/19   Scot Jun, FNP  ALPRAZolam Duanne Moron) 0.5 MG tablet TAKE ONE TABLET TWICE DAILY AS NEEDED FOR ANXIETY 09/03/20   Tower, Wynelle Fanny, MD  atorvastatin (LIPITOR) 20 MG tablet Take 1 tablet (20 mg total) by mouth daily. 11/13/21   Tower, [place]  A, MD  BLISOVI FE 1/20 1-20 MG-MCG tablet TAKE 1 TABLET BY MOUTH ONCE DAILY 10/28/21   Tower, Audrie Gallus, MD  buPROPion (WELLBUTRIN XL) 150 MG 24 hr tablet Take 1 tablet (150 mg total) by mouth daily. 11/13/21   Tower, Audrie Gallus, MD  cyclobenzaprine (FLEXERIL) 5 MG tablet Take 1 tablet (5 mg total) by mouth 2 (two) times daily as needed for muscle spasms. 11/21/21   Doreene Nest, NP  fluticasone (FLONASE) 50 MCG/ACT nasal spray Place 2 sprays into both nostrils daily. 12/20/18   Tower, Audrie Gallus, MD  omeprazole (PRILOSEC) 20 MG capsule Take 2 capsules (40 mg total) by mouth daily. 10/20/21 11/19/21  Loeffler, Finis Bud, PA-C  ondansetron (ZOFRAN) 4 MG tablet TAKE ONE TABLET EVERY EIGHT HOURS AS NEEDED FOR NAUSEA / VOMITING 09/03/20   Tower, Audrie Gallus, MD  SUMAtriptan (IMITREX) 100 MG tablet TAKE ONE TABLET EVERY 2 HOURS AS NEEDED FOR MIRGRAINE MAY REPEAT IN 2HR 09/03/20   Tower, Audrie Gallus, MD  valACYclovir (VALTREX) 1000 MG tablet Take 2 pills by mouth twice daily for 1 day for a cold sore 09/23/18   Tower, Audrie Gallus, MD    Allergies Delsym [dextromethorphan]   REVIEW OF SYSTEMS  Negative except as noted here or in the History of Present Illness.   PHYSICAL EXAMINATION  Initial Vital Signs Blood pressure (!) 118/93, pulse (!) 116, temperature 98.1 F (36.7 C), temperature source Oral, resp. rate 16, height 5'  1" (1.549 m), weight 99.8 kg, last menstrual period 12/22/2021, SpO2 98 %, unknown if currently breastfeeding.  Examination General: Well-developed, well-nourished female in no acute distress; appearance consistent with age of record HENT: normocephalic; atraumatic Eyes: Normal appearance Neck: supple Heart: regular rate and rhythm Lungs: clear to auscultation bilaterally Abdomen: soft; nondistended; epigastric and right upper quadrant tenderness; bowel sounds present Extremities: No deformity; full range of motion; pulses normal Neurologic: Awake, alert and oriented; motor function intact in all extremities and symmetric; no facial droop Skin: Warm and dry Psychiatric: Normal mood and affect   RESULTS  Summary of this visit's results, reviewed and interpreted by myself:   EKG Interpretation  Date/Time:    Ventricular Rate:    PR Interval:    QRS Duration:   QT Interval:    QTC Calculation:   R Axis:     Text Interpretation:         Laboratory Studies: Results for orders placed or performed during the hospital encounter of 01/13/22 (from the past 24 hour(s))  Lipase, blood     Status: None   Collection Time: 01/13/22  1:31 AM  Result Value Ref Range   Lipase 27 11 - 51 U/L  Comprehensive metabolic panel     Status: Abnormal   Collection Time: 01/13/22  1:31 AM  Result Value Ref Range   Sodium 134 (L) 135 - 145 mmol/L   Potassium 3.8 3.5 - 5.1 mmol/L   Chloride 107 98 - 111 mmol/L   CO2 21 (L) 22 - 32 mmol/L   Glucose, Bld 103 (H) 70 - 99 mg/dL   BUN 11 6 - 20 mg/dL   Creatinine, Ser 5.18 0.44 - 1.00 mg/dL   Calcium 8.3 (L) 8.9 - 10.3 mg/dL   Total Protein 8.0 6.5 - 8.1 g/dL   Albumin 3.3 (L) 3.5 - 5.0 g/dL   AST 16 15 - 41 U/L   ALT 13 0 - 44 U/L   Alkaline Phosphatase 80 38 - 126 U/L   Total  Bilirubin 0.7 0.3 - 1.2 mg/dL   GFR, Estimated >44 >81 mL/min   Anion gap 6 5 - 15  CBC     Status: Abnormal   Collection Time: 01/13/22  1:31 AM  Result Value Ref  Range   WBC 11.8 (H) 4.0 - 10.5 K/uL   RBC 4.91 3.87 - 5.11 MIL/uL   Hemoglobin 14.2 12.0 - 15.0 g/dL   HCT 85.6 31.4 - 97.0 %   MCV 84.9 80.0 - 100.0 fL   MCH 28.9 26.0 - 34.0 pg   MCHC 34.1 30.0 - 36.0 g/dL   RDW 26.3 78.5 - 88.5 %   Platelets 423 (H) 150 - 400 K/uL   nRBC 0.0 0.0 - 0.2 %  Urinalysis, Routine w reflex microscopic     Status: Abnormal   Collection Time: 01/13/22  1:31 AM  Result Value Ref Range   Color, Urine YELLOW YELLOW   APPearance HAZY (A) CLEAR   Specific Gravity, Urine >=1.030 1.005 - 1.030   pH 6.0 5.0 - 8.0   Glucose, UA NEGATIVE NEGATIVE mg/dL   Hgb urine dipstick TRACE (A) NEGATIVE   Bilirubin Urine NEGATIVE NEGATIVE   Ketones, ur NEGATIVE NEGATIVE mg/dL   Protein, ur NEGATIVE NEGATIVE mg/dL   Nitrite NEGATIVE NEGATIVE   Leukocytes,Ua NEGATIVE NEGATIVE  Pregnancy, urine     Status: None   Collection Time: 01/13/22  1:31 AM  Result Value Ref Range   Preg Test, Ur NEGATIVE NEGATIVE  Urinalysis, Microscopic (reflex)     Status: Abnormal   Collection Time: 01/13/22  1:31 AM  Result Value Ref Range   RBC / HPF 0-5 0 - 5 RBC/hpf   WBC, UA 0-5 0 - 5 WBC/hpf   Bacteria, UA MANY (A) NONE SEEN   Squamous Epithelial / LPF 0-5 0 - 5   Mucus PRESENT    Hyaline Casts, UA PRESENT    Imaging Studies: No results found.  ED COURSE and MDM  Nursing notes, initial and subsequent vitals signs, including pulse oximetry, reviewed and interpreted by myself.  Vitals:   01/13/22 0117 01/13/22 0122  BP:  (!) 118/93  Pulse:  (!) 116  Resp:  16  Temp:  98.1 F (36.7 C)  TempSrc:  Oral  SpO2:  98%  Weight: 99.8 kg   Height: 5\' 1"  (1.549 m)    Medications  sodium chloride 0.9 % bolus 1,000 mL (1,000 mLs Intravenous New Bag/Given 01/13/22 0304)  ondansetron (ZOFRAN) injection 4 mg (4 mg Intravenous Given 01/13/22 0304)  fentaNYL (SUBLIMAZE) injection 50 mcg (50 mcg Intravenous Given 01/13/22 0310)   3:59 AM Urine shows signs of dehydration given high specific  gravity.  Patient given IV hydration.  Patient's pain is improved after IV fentanyl.  Because of the location of her pain there is a possibility of biliary colic although I doubt acute cholecystitis.  In either case we will have her return later today for right upper quadrant ultrasound.   PROCEDURES  Procedures   ED DIAGNOSES     ICD-10-CM   1. Right upper quadrant abdominal pain  R10.11     2. Nausea vomiting and diarrhea  R11.2    R19.7          Armstead Heiland, 03/15/22, MD 01/13/22 [phone]

## 2022-01-13 NOTE — ED Triage Notes (Signed)
Pt is c/o abd pain with nausea and diarrhea  Sxs started about 24hrs ago

## 2022-01-14 ENCOUNTER — Other Ambulatory Visit: Payer: Self-pay | Admitting: Family Medicine

## 2022-01-15 NOTE — Telephone Encounter (Signed)
Last filled on 09/03/20 #30 tabs with 1 refill, f/u scheduled on 02/16/22

## 2022-01-17 ENCOUNTER — Other Ambulatory Visit: Payer: Self-pay | Admitting: Family Medicine

## 2022-01-17 DIAGNOSIS — Z30011 Encounter for initial prescription of contraceptive pills: Secondary | ICD-10-CM

## 2022-01-22 NOTE — Progress Notes (Deleted)
GYNECOLOGY OFFICE VISIT NOTE  History:   Yvonne Trujillo is a 36 y.o. G1P1001 here today for acne and hirsutism. She saw her PCP for it in July. Her TSH at thatt ime was normal. Otherwise routine blood work was checked.   She is on OCPs specifically Blisovi.   She had an [place] which was overall normal. She had a 2.9 cm simple cyst but otherwise normal and it was on 11/20/21.   She denies any abnormal vaginal discharge, bleeding, pelvic pain or other concerns.     Past Medical History:  Diagnosis Date   Asthma    Concussion    History of depression    Kidney infection    Kidney stone    Migraine    Supervision of normal first pregnancy, antepartum 06/23/2017    Clinic  [facility] Prenatal Labs Dating  Blood type:    Genetic Screen 1 Screen:    AFP:     Quad:     NIPS: Antibody:  Anatomic [place]  Rubella:   GTT Early:               Third trimester:  RPR:    Flu vaccine  HBsAg:    TDaP vaccine                                               Rhogam: HIV:   Baby Food        Breast                                        GBS: (For PCN allergy, check sensitivities) Cont    Past Surgical History:  Procedure Laterality Date   APPENDECTOMY     EXTRACORPOREAL SHOCK WAVE LITHOTRIPSY Left 07/09/2016   Procedure: EXTRACORPOREAL SHOCK WAVE LITHOTRIPSY (ESWL);  Surgeon: Vanna Scotland, MD;  Location: [facility] ORS;  Service: Urology;  Laterality: Left;    The following portions of the patient's history were reviewed and updated as appropriate: allergies, current medications, past family history, past medical history, past social history, past surgical history and problem list.   Health Maintenance:   Normal pap and negative HRHPV:   Diagnosis  Date Value Ref Range Status  06/07/2020   Final   - Negative for intraepithelial lesion or malignancy (NILM)   Review of Systems:  Pertinent items noted in HPI and remainder of comprehensive ROS otherwise negative.  Physical Exam:  LMP 12/22/2021 (Approximate)   CONSTITUTIONAL: Well-developed, well-nourished female in no acute distress.  HEENT:  Normocephalic, atraumatic. External right and left ear normal. No scleral icterus.  NECK: Normal range of motion, supple, no masses noted on observation SKIN: No rash noted. Not diaphoretic. No erythema. No pallor. MUSCULOSKELETAL: Normal range of motion. No edema noted. NEUROLOGIC: Alert and oriented to person, place, and time. Normal muscle tone coordination. No cranial nerve deficit noted. PSYCHIATRIC: Normal mood and affect. Normal behavior. Normal judgment and thought content.  CARDIOVASCULAR: Normal heart rate noted RESPIRATORY: Effort and breath sounds normal, no problems with respiration noted ABDOMEN: No masses noted. No other overt distention noted.    PELVIC: Deferred  Labs and Imaging No results found for this or any previous visit (from the past 168 hour(s)).   Assessment and Plan:   1. Acne vulgaris ***  2. Hirsutism ***    Diagnoses and all orders for this visit:  Acne vulgaris  Hirsutism    Routine preventative health maintenance measures emphasized. Please refer to After Visit Summary for other counseling recommendations.   No follow-ups on file.  Radene Gunning, MD, [place] for [facility], [place]

## 2022-01-26 ENCOUNTER — Telehealth: Payer: Self-pay

## 2022-01-26 NOTE — Telephone Encounter (Signed)
Called pt to confirm appointment for tomorrow.  Left message for pt to call and confirm appointment by 1700 today  

## 2022-01-27 ENCOUNTER — Encounter: Payer: Commercial Managed Care - HMO | Admitting: Obstetrics and Gynecology

## 2022-01-27 DIAGNOSIS — L680 Hirsutism: Secondary | ICD-10-CM

## 2022-01-27 DIAGNOSIS — L700 Acne vulgaris: Secondary | ICD-10-CM

## 2022-02-16 ENCOUNTER — Ambulatory Visit: Payer: Commercial Managed Care - HMO | Admitting: Family Medicine

## 2022-03-19 ENCOUNTER — Other Ambulatory Visit: Payer: Self-pay | Admitting: Family Medicine

## 2022-03-19 DIAGNOSIS — Z30011 Encounter for initial prescription of contraceptive pills: Secondary | ICD-10-CM

## 2022-08-26 ENCOUNTER — Other Ambulatory Visit: Payer: Self-pay | Admitting: Family Medicine

## 2022-08-26 DIAGNOSIS — Z30011 Encounter for initial prescription of contraceptive pills: Secondary | ICD-10-CM

## 2022-10-15 ENCOUNTER — Ambulatory Visit: Payer: Commercial Managed Care - HMO | Admitting: Primary Care

## 2022-10-15 VITALS — BP 124/82 | HR 82 | Temp 97.3°F | Wt 221.0 lb

## 2022-10-15 DIAGNOSIS — N368 Other specified disorders of urethra: Secondary | ICD-10-CM | POA: Diagnosis not present

## 2022-10-15 LAB — POC URINALSYSI DIPSTICK (AUTOMATED)
Bilirubin, UA: NEGATIVE
Blood, UA: NEGATIVE
Glucose, UA: NEGATIVE
Ketones, UA: NEGATIVE
Leukocytes, UA: NEGATIVE
Nitrite, UA: NEGATIVE
Protein, UA: NEGATIVE
Spec Grav, UA: <=1.005 — AB (ref 1.010–1.025)
Urobilinogen, UA: NEGATIVE E.U./dL — AB
pH, UA: 6 (ref 5.0–8.0)

## 2022-10-15 NOTE — Assessment & Plan Note (Signed)
Unclear etiology.  Urinalysis today is negative. We will send off for culture for verification.  We discussed other causes including abrasion. Consider sitz bath, warm soaks.  She does not appear to be experiencing a kidney stone, pyelonephritis, symptoms of STD. If symptoms do not improve with conservative treatment, and urine culture is negative, would recommend pelvic exam with vaginal swabs.

## 2022-10-15 NOTE — Patient Instructions (Signed)
We will be in touch once we receive your urine test result.  Try warm bath soaks or sitz bath.  Please notify me if you develop any other symptoms as discussed.  It was a pleasure to see you today!

## 2022-10-15 NOTE — Progress Notes (Signed)
Subjective:    Patient ID: Yvonne Trujillo, female    DOB: 1985/12/01, 37 y.o.   MRN: [id number]  HPI  Yvonne Trujillo is a very pleasant 37 y.o. female patient of Dr. Milinda Antis with a history of acute cystitis, renal stones, irregular menses who presents today to discuss urethral pain.  Symptom onset 4 days ago with burning at the urethra and tenderness when wiping after urinating.  She has also noticed slight odor to her urine, generalized suprapubic pressure.   She denies urinary frequency, fevers, hematuria, flank pain, lower back pain, changes in soaps/detergents, new unlaundered towels, urethral swelling, vaginal discharge, vaginal itching.   She drinks water regularly. She last had intercourse 6 days ago. She has not shaved the perineum and doesn't recall scratching the urethra.    Review of Systems  Constitutional:  Negative for fever.  Gastrointestinal:  Negative for abdominal pain, diarrhea, nausea and vomiting.  Genitourinary:  Positive for pelvic pain. Negative for dysuria, flank pain, frequency, hematuria and vaginal discharge.       Slight odor to urine, urethral pain         Past Medical History:  Diagnosis Date   Asthma    Concussion    History of depression    Kidney infection    Kidney stone    Migraine    Supervision of normal first pregnancy, antepartum 06/23/2017    Clinic  [facility] Prenatal Labs Dating  Blood type:    Genetic Screen 1 Screen:    AFP:     Quad:     NIPS: Antibody:  Anatomic [place]  Rubella:   GTT Early:               Third trimester:  RPR:    Flu vaccine  HBsAg:    TDaP vaccine                                               Rhogam: HIV:   Baby Food        Breast                                        GBS: (For PCN allergy, check sensitivities) Cont    Social History   Socioeconomic History   Marital status: Single    Spouse name: Not on file   Number of children: Not on file   Years of education: Not on file   Highest education level: Associate degree:  occupational, Scientist, product/process development, or vocational program  Occupational History   Not on file  Tobacco Use   Smoking status: Former    Years: 6    Types: Cigarettes   Smokeless tobacco: Never  Vaping Use   Vaping Use: Never used  Substance and Sexual Activity   Alcohol use: Yes    Comment: occ   Drug use: Not Currently    Comment: occ   Sexual activity: Yes    Birth control/protection: None  Other Topics Concern   Not on file  Social History Narrative   Not on file   Social Determinants of Health   Financial Resource Strain: Low Risk  (10/15/2022)   Overall Financial Resource Strain (CARDIA)    Difficulty of Paying Living Expenses: Not hard at all  Food  Insecurity: No Food Insecurity (10/15/2022)   Hunger Vital Sign    Worried About Running Out of Food in the Last Year: Never true    Ran Out of Food in the Last Year: Never true  Transportation Needs: No Transportation Needs (10/15/2022)   PRAPARE - Administrator, Civil Service (Medical): No    Lack of Transportation (Non-Medical): No  Physical Activity: Insufficiently Active (10/15/2022)   Exercise Vital Sign    Days of Exercise per Week: 2 days    Minutes of Exercise per Session: 20 min  Stress: No Stress Concern Present (10/15/2022)   [employer] of Occupational Health - Occupational Stress Questionnaire    Feeling of Stress : Only a little  Social Connections: Moderately Integrated (10/15/2022)   Social Connection and Isolation Panel [NHANES]    Frequency of Communication with Friends and Family: More than three times a week    Frequency of Social Gatherings with Friends and Family: Once a week    Attends Religious Services: More than 4 times per year    Active Member of [employer] or Organizations: No    Attends Banker Meetings: Not on file    Marital Status: Living with partner  Intimate Partner Violence: Not At Risk (01/15/2018)   Humiliation, Afraid, Rape, and Kick questionnaire    Fear of Current  or Ex-Partner: No    Emotionally Abused: No    Physically Abused: No    Sexually Abused: No    Past Surgical History:  Procedure Laterality Date   APPENDECTOMY     EXTRACORPOREAL SHOCK WAVE LITHOTRIPSY Left 07/09/2016   Procedure: EXTRACORPOREAL SHOCK WAVE LITHOTRIPSY (ESWL);  Surgeon: Vanna Scotland, MD;  Location: [facility] ORS;  Service: Urology;  Laterality: Left;    Family History  Problem Relation Age of Onset   Alcohol abuse Maternal Uncle    Hyperlipidemia Mother    Hypertension Mother    Heart disease Mother    Hyperlipidemia Father    Hypertension Father    Diabetes Cousin    Bladder Cancer Neg Hx    Kidney cancer Neg Hx     Allergies  Allergen Reactions   Delsym [Dextromethorphan]     Makes her feel bad    Current Outpatient Medications on File Prior to Visit  Medication Sig Dispense Refill   albuterol (VENTOLIN HFA) 108 (90 Base) MCG/ACT inhaler Inhale 2 puffs into the lungs every 4 (four) hours as needed for wheezing or shortness of breath (cough, shortness of breath or wheezing.). 18 g 3   ALPRAZolam (XANAX) 0.5 MG tablet TAKE ONE TABLET TWICE DAILY AS NEEDED FOR ANXIETY 30 tablet 0   atorvastatin (LIPITOR) 20 MG tablet Take 1 tablet (20 mg total) by mouth daily. 90 tablet 3   buPROPion (WELLBUTRIN XL) 150 MG 24 hr tablet Take 1 tablet (150 mg total) by mouth daily. 90 tablet 3   fluticasone (FLONASE) 50 MCG/ACT nasal spray Place 2 sprays into both nostrils daily. 16 g 6   norethindrone-ethinyl estradiol-FE (BLISOVI FE 1/20) 1-20 MG-MCG tablet TAKE 1 TABLET BY MOUTH ONCE DAILY 84 tablet 0   ondansetron (ZOFRAN) 4 MG tablet TAKE ONE TABLET EVERY EIGHT HOURS AS NEEDED FOR NAUSEA / VOMITING 30 tablet 1   SUMAtriptan (IMITREX) 100 MG tablet TAKE ONE TABLET EVERY 2 HOURS AS NEEDED FOR MIRGRAINE MAY REPEAT IN 2HR 10 tablet 11   valACYclovir (VALTREX) 1000 MG tablet Take 2 pills by mouth twice daily for 1 day for a cold sore  4 tablet 3   cyclobenzaprine (FLEXERIL) 5 MG  tablet Take 1 tablet (5 mg total) by mouth 2 (two) times daily as needed for muscle spasms. 14 tablet 0   omeprazole (PRILOSEC) 20 MG capsule Take 2 capsules (40 mg total) by mouth daily. 60 capsule 0   No current facility-administered medications on file prior to visit.    BP 124/82   Pulse 82   Temp (!) 97.3 F (36.3 C) (Temporal)   Wt 221 lb (100.2 kg)   LMP 09/28/2022 (Exact Date)   SpO2 99%   Breastfeeding No   BMI 41.76 kg/m  Objective:   Physical Exam Constitutional:      General: She is not in acute distress. Cardiovascular:     Rate and Rhythm: Normal rate and regular rhythm.  Pulmonary:     Effort: Pulmonary effort is normal.  Abdominal:     Tenderness: There is no right CVA tenderness or left CVA tenderness.           Assessment & Plan:  Urethral meatus pain Assessment & Plan: Unclear etiology.  Urinalysis today is negative. We will send off for culture for verification.  We discussed other causes including abrasion. Consider sitz bath, warm soaks.  She does not appear to be experiencing a kidney stone, pyelonephritis, symptoms of STD. If symptoms do not improve with conservative treatment, and urine culture is negative, would recommend pelvic exam with vaginal swabs.  Orders: -     POCT Urinalysis Dipstick (Automated) -     Urine Culture        Doreene Nest, NP

## 2022-10-16 LAB — URINE CULTURE
MICRO NUMBER:: 15079202
SPECIMEN QUALITY:: ADEQUATE

## 2022-11-23 ENCOUNTER — Other Ambulatory Visit: Payer: Self-pay | Admitting: Family Medicine

## 2022-11-24 NOTE — Telephone Encounter (Signed)
LVM for patient to c/b and schedule.  

## 2022-11-24 NOTE — Telephone Encounter (Signed)
Pt is overdue for a CPE or at least a med refill, please schedule and then route back to me to refill

## 2022-11-24 NOTE — Telephone Encounter (Signed)
Patient scheduled.

## 2022-11-27 ENCOUNTER — Ambulatory Visit (INDEPENDENT_AMBULATORY_CARE_PROVIDER_SITE_OTHER): Payer: Commercial Managed Care - HMO | Admitting: Family Medicine

## 2022-11-27 VITALS — BP 120/78 | HR 77 | Temp 98.7°F | Ht 61.5 in | Wt 211.0 lb

## 2022-11-27 DIAGNOSIS — E7800 Pure hypercholesterolemia, unspecified: Secondary | ICD-10-CM | POA: Diagnosis not present

## 2022-11-27 DIAGNOSIS — N926 Irregular menstruation, unspecified: Secondary | ICD-10-CM

## 2022-11-27 DIAGNOSIS — Z1159 Encounter for screening for other viral diseases: Secondary | ICD-10-CM | POA: Insufficient documentation

## 2022-11-27 DIAGNOSIS — Z0000 Encounter for general adult medical examination without abnormal findings: Secondary | ICD-10-CM | POA: Diagnosis not present

## 2022-11-27 DIAGNOSIS — Z79899 Other long term (current) drug therapy: Secondary | ICD-10-CM | POA: Insufficient documentation

## 2022-11-27 DIAGNOSIS — G43009 Migraine without aura, not intractable, without status migrainosus: Secondary | ICD-10-CM

## 2022-11-27 DIAGNOSIS — L680 Hirsutism: Secondary | ICD-10-CM

## 2022-11-27 DIAGNOSIS — F418 Other specified anxiety disorders: Secondary | ICD-10-CM

## 2022-11-27 DIAGNOSIS — Z131 Encounter for screening for diabetes mellitus: Secondary | ICD-10-CM | POA: Insufficient documentation

## 2022-11-27 LAB — CBC WITH DIFFERENTIAL/PLATELET
Absolute Monocytes: 612 cells/uL (ref 200–950)
Basophils Absolute: 41 cells/uL (ref 0–200)
Eosinophils Absolute: 163 cells/uL (ref 15–500)
Eosinophils Relative: 1.6 %
HCT: 39.5 % (ref 35.0–45.0)
Hemoglobin: 13.1 g/dL (ref 11.7–15.5)
Lymphs Abs: 2968 cells/uL (ref 850–3900)
MCH: 28.3 pg (ref 27.0–33.0)
MCHC: 33.2 g/dL (ref 32.0–36.0)
MCV: 85.3 fL (ref 80.0–100.0)
MPV: 9 fL (ref 7.5–12.5)
Monocytes Relative: 6 %
Neutro Abs: 6416 cells/uL (ref 1500–7800)
Neutrophils Relative %: 62.9 %
RBC: 4.63 10*6/uL (ref 3.80–5.10)
RDW: 12 % (ref 11.0–15.0)
Total Lymphocyte: 29.1 %
WBC: 10.2 10*3/uL (ref 3.8–10.8)

## 2022-11-27 NOTE — Patient Instructions (Addendum)
If /when you want to see gyn in the future to discuss possible pcos   Keep riding bike Add some strength training to your routine, this is important for bone and brain health and can reduce your risk of falls and help your body use insulin properly and regulate weight  Light weights, exercise bands , and internet videos are a good way to start  Yoga (chair or regular), machines , floor exercises or a gym with machines are also good options   Try to get most of your carbohydrates from produce (with the exception of white potatoes) and whole grains Eat less bread/pasta/rice/snack foods/cereals/sweets and other items from the middle of the grocery store (processed carbs)   Wear sun protection   Take care of yourself

## 2022-11-27 NOTE — Progress Notes (Unsigned)
Subjective:    Patient ID: Yvonne Trujillo, female    DOB: 1985-11-06, 37 y.o.   MRN: [id number]  HPI  Here for health maintenance exam and to review chronic medical problems   Wt Readings from Last 3 Encounters:  11/27/22 211 lb (95.7 kg)  10/15/22 221 lb (100.2 kg)  01/13/22 220 lb (99.8 kg)   39.22 kg/m  Vitals:   11/27/22 1554  BP: 120/78  Pulse: 77  Temp: 98.7 F (37.1 C)  SpO2: 99%    Immunization History  Administered Date(s) Administered  . Influenza, Seasonal, Injecte, Preservative Fre 02/16/2016  . Influenza,inj,Quad PF,6+ Mos 01/11/2018  . Influenza-Unspecified 01/02/2017, 01/17/2020  . PFIZER(Purple Top)SARS-COV-2 Vaccination 05/08/2019, 05/29/2019  . PPD Test 10/17/2014  . Tdap 04/24/2013, 10/28/2017    Health Maintenance Due  Topic Date Due  . Hepatitis C Screening  Never done  . COVID-19 Vaccine (3 - Pfizer risk series) 06/26/2019   Working  Doing ok  Mom also  58 year old   Feeling ok  Getting some time for self care    Self breast exam: no lumps   Gyn care/ pap - pap 06/2020  Normal with negative HPV   Menses- regular /no problems  Takes OC 1/20   Still wonders about pcos    Bone health  Falls: none  Fractures:none Supplements -none  Exercise : 2 d per week - exercise bike    Mood    11/27/2022    4:04 PM 11/13/2021    8:18 AM 06/07/2020    4:01 PM 11/02/2018    1:18 PM 11/02/2018   12:25 PM  Depression screen PHQ 2/9  Decreased Interest 0 0 0 0 0  Down, Depressed, Hopeless 0 0 0 0 0  PHQ - 2 Score 0 0 0 0 0  Altered sleeping 0  1 0   Tired, decreased energy 0  1 0   Change in appetite 0  1 0   Feeling bad or failure about yourself  0  0 0   Trouble concentrating 1  1 0   Moving slowly or fidgety/restless 0  1 0   Suicidal thoughts 0  0 0   PHQ-9 Score 1  5 0   Difficult doing work/chores Not difficult at all   Not difficult at all    History of depression  Wellbutrin xl 150 mg daily Increase the dose did not help more/  went back to the og dose and better now      Hyperlipidemia Lab Results  Component Value Date   CHOL 200 11/13/2021   HDL 42.40 11/13/2021   LDLCALC 120 (H) 11/13/2021   LDLDIRECT 223.2 04/24/2013   TRIG 184.0 (H) 11/13/2021   CHOLHDL 5 11/13/2021   Due for labs  Takes atorvastatin 20 mg daily   Ppi is on med list-omeprazole Added B12 and D to labs   History of migraines  Topamax gave her side effects  Has imitrex and zofran for prn use   Diet has been better  Eating less More protein     Patient Active Problem List   Diagnosis Date Noted  . Current use of proton pump inhibitor 11/27/2022  . Diabetes mellitus screening 11/27/2022  . Need for hepatitis C screening test 11/27/2022  . Urethral meatus pain 10/15/2022  . Acute pelvic pain 11/20/2021  . Fatigue 11/13/2021  . Acute cystitis 11/13/2021  . Irregular menses 11/13/2021  . Visit for routine gyn exam 06/07/2020  . Pain of  upper abdomen 01/04/2020  . Nasal congestion 12/20/2018  . Morbid obesity (HCC) 12/20/2018  . History of gestational hypertension 01/15/2018  . Herpes simplex 01/18/2015  . Hyperlipidemia 04/25/2013  . Migraine 04/24/2013  . Depression with anxiety 04/24/2013  . Hirsutism 04/24/2013  . Acne 04/24/2013  . History of kidney stones 04/24/2013  . Routine general medical examination at a health care facility 04/24/2013   Past Medical History:  Diagnosis Date  . Asthma   . Concussion   . History of depression   . Kidney infection   . Kidney stone   . Migraine   . Supervision of normal first pregnancy, antepartum 06/23/2017    Clinic  [facility] Prenatal Labs Dating  Blood type:    Genetic Screen 1 Screen:    AFP:     Quad:     NIPS: Antibody:  Anatomic [place]  Rubella:   GTT Early:               Third trimester:  RPR:    Flu vaccine  HBsAg:    TDaP vaccine                                               Rhogam: HIV:   Baby Food        Breast                                        GBS: (For PCN  allergy, check sensitivities) Cont   Past Surgical History:  Procedure Laterality Date  . APPENDECTOMY    . EXTRACORPOREAL SHOCK WAVE LITHOTRIPSY Left 07/09/2016   Procedure: EXTRACORPOREAL SHOCK WAVE LITHOTRIPSY (ESWL);  Surgeon: Vanna Scotland, MD;  Location: [facility] ORS;  Service: Urology;  Laterality: Left;   Social History   Tobacco Use  . Smoking status: Former    Types: Cigarettes  . Smokeless tobacco: Never  Vaping Use  . Vaping status: Never Used  Substance Use Topics  . Alcohol use: Yes    Comment: occ  . Drug use: Not Currently    Comment: occ   Family History  Problem Relation Age of Onset  . Alcohol abuse Maternal Uncle   . Hyperlipidemia Mother   . Hypertension Mother   . Heart disease Mother   . Hyperlipidemia Father   . Hypertension Father   . Diabetes Cousin   . Bladder Cancer Neg Hx   . Kidney cancer Neg Hx    Allergies  Allergen Reactions  . Delsym [Dextromethorphan]     Makes her feel bad   Current Outpatient Medications on File Prior to Visit  Medication Sig Dispense Refill  . albuterol (VENTOLIN HFA) 108 (90 Base) MCG/ACT inhaler Inhale 2 puffs into the lungs every 4 (four) hours as needed for wheezing or shortness of breath (cough, shortness of breath or wheezing.). 18 g 3  . ALPRAZolam (XANAX) 0.5 MG tablet TAKE ONE TABLET TWICE DAILY AS NEEDED FOR ANXIETY 30 tablet 0  . atorvastatin (LIPITOR) 20 MG tablet TAKE 1 TABLET BY MOUTH ONCE DAILY 90 tablet 0  . buPROPion (WELLBUTRIN XL) 150 MG 24 hr tablet TAKE 1 TABLET BY MOUTH ONCE DAILY 90 tablet 0  . fluticasone (FLONASE) 50 MCG/ACT nasal spray Place 2 sprays into both nostrils daily. 16 g 6  .  norethindrone-ethinyl estradiol-FE (BLISOVI FE 1/20) 1-20 MG-MCG tablet TAKE 1 TABLET BY MOUTH ONCE DAILY 84 tablet 0  . omeprazole (PRILOSEC) 20 MG capsule Take 2 capsules (40 mg total) by mouth daily. 60 capsule 0  . ondansetron (ZOFRAN) 4 MG tablet TAKE ONE TABLET EVERY EIGHT HOURS AS NEEDED FOR NAUSEA /  VOMITING 30 tablet 1  . SUMAtriptan (IMITREX) 100 MG tablet TAKE ONE TABLET EVERY 2 HOURS AS NEEDED FOR MIRGRAINE MAY REPEAT IN 2HR 10 tablet 11  . valACYclovir (VALTREX) 1000 MG tablet Take 2 pills by mouth twice daily for 1 day for a cold sore 4 tablet 3   No current facility-administered medications on file prior to visit.    Review of Systems     Objective:   Physical Exam        Assessment & Plan:   Problem List Items Addressed This Visit       Cardiovascular and Mediastinum   Migraine     Other   Routine general medical examination at a health care facility - Primary   Relevant Orders   TSH   Lipid panel   Comprehensive metabolic panel   CBC with Differential/Platelet   Need for hepatitis C screening test   Relevant Orders   Hepatitis C Antibody   Morbid obesity (HCC)   Irregular menses   Hyperlipidemia   Relevant Orders   TSH   Diabetes mellitus screening   Relevant Orders   Hemoglobin A1c   Current use of proton pump inhibitor   Relevant Orders   VITAMIN D 25 Hydroxy (Vit-D Deficiency, Fractures)   Vitamin B12

## 2022-11-28 LAB — CBC WITH DIFFERENTIAL/PLATELET
Basophils Relative: 0.4 %
Platelets: 531 10*3/uL — ABNORMAL HIGH (ref 140–400)

## 2022-11-29 NOTE — Assessment & Plan Note (Signed)
Considering ref to gyn to eval for pcos

## 2022-11-29 NOTE — Assessment & Plan Note (Signed)
A1c added to labs, in setting of obesity and features of pcos

## 2022-11-29 NOTE — Assessment & Plan Note (Signed)
Disc goals for lipids and reasons to control them Rev last labs with pt Rev low sat fat diet in detail absolutely today  Diet has been better

## 2022-11-29 NOTE — Assessment & Plan Note (Signed)
Discussed how this problem influences overall health and the risks it imposes  Reviewed plan for weight loss with lower calorie diet (via better food choices (lower glycemic and portion control) along with exercise building up to or more than 30 minutes 5 days per week including some aerobic activity and strength training   Some ? About possible pcos , considering ref to gyn

## 2022-11-29 NOTE — Assessment & Plan Note (Signed)
Taking wellbutrin xl 150 mg daily - increasing dose briefly did not help but doing well on this now  PHQ: 1  Encouraged ogod self care

## 2022-11-29 NOTE — Assessment & Plan Note (Signed)
Low risk Hep C screen added to labs

## 2022-11-29 NOTE — Assessment & Plan Note (Addendum)
Reviewed health habits including diet and exercise and skin cancer prevention Reviewed appropriate screening tests for age  Also reviewed health mt list, fam hx and immunization status , as well as social and family history   See HPI Labs reviewed and ordered Pap utd 06/2020 (normal witn heb HPV) Regular menses on OC  Considering gyn referral to be evaluated for PCOS  Discussed vit D intake and exercise for bone health  PHQ: 1

## 2022-11-29 NOTE — Assessment & Plan Note (Signed)
Did no tolerate topamax Uses imitrex and zofran for rescue

## 2022-11-29 NOTE — Assessment & Plan Note (Signed)
Vit B12 and D levels added to labs Now using omeprazole prn instead of daily

## 2022-11-29 NOTE — Assessment & Plan Note (Signed)
Doing better on OC

## 2022-12-01 ENCOUNTER — Telehealth: Payer: Self-pay

## 2022-12-01 NOTE — Telephone Encounter (Signed)
Patient needs lab appt scheduled 2-3 weeks from now. Please call to schedule.

## 2022-12-01 NOTE — Telephone Encounter (Signed)
Scheduled pt for 12/17/22

## 2022-12-16 ENCOUNTER — Telehealth: Payer: Self-pay | Admitting: Family Medicine

## 2022-12-16 DIAGNOSIS — R7989 Other specified abnormal findings of blood chemistry: Secondary | ICD-10-CM

## 2022-12-16 NOTE — Telephone Encounter (Signed)
-----   Message from Alvina Chou sent at 12/04/2022  3:36 PM EDT ----- Regarding: Lab orders for Thursday, 8.15.24 Lab orders, thanks

## 2022-12-17 ENCOUNTER — Other Ambulatory Visit (INDEPENDENT_AMBULATORY_CARE_PROVIDER_SITE_OTHER): Payer: Commercial Managed Care - HMO

## 2022-12-17 DIAGNOSIS — R7989 Other specified abnormal findings of blood chemistry: Secondary | ICD-10-CM | POA: Diagnosis not present

## 2022-12-17 LAB — IRON: Iron: 54 ug/dL (ref 42–145)

## 2022-12-17 NOTE — Addendum Note (Signed)
Addended by: Alvina Chou on: 12/17/2022 07:42 AM   Modules accepted: Orders

## 2022-12-18 LAB — CBC WITH DIFFERENTIAL/PLATELET
Absolute Monocytes: 482 {cells}/uL (ref 200–950)
Basophils Absolute: 32 {cells}/uL (ref 0–200)
Basophils Relative: 0.3 %
Eosinophils Absolute: 375 {cells}/uL (ref 15–500)
Eosinophils Relative: 3.5 %
HCT: 39.5 % (ref 35.0–45.0)
Hemoglobin: 12.9 g/dL (ref 11.7–15.5)
Lymphs Abs: 2333 {cells}/uL (ref 850–3900)
MCH: 28.4 pg (ref 27.0–33.0)
MCHC: 32.7 g/dL (ref 32.0–36.0)
MCV: 87 fL (ref 80.0–100.0)
MPV: 9.1 fL (ref 7.5–12.5)
Monocytes Relative: 4.5 %
Neutro Abs: 7479 {cells}/uL (ref 1500–7800)
Neutrophils Relative %: 69.9 %
Platelets: 425 10*3/uL — ABNORMAL HIGH (ref 140–400)
RBC: 4.54 10*6/uL (ref 3.80–5.10)
RDW: 12.4 % (ref 11.0–15.0)
Total Lymphocyte: 21.8 %
WBC: 10.7 10*3/uL (ref 3.8–10.8)

## 2022-12-18 LAB — PATHOLOGIST SMEAR REVIEW

## 2022-12-23 ENCOUNTER — Other Ambulatory Visit: Payer: Self-pay | Admitting: Family Medicine

## 2022-12-23 DIAGNOSIS — Z30011 Encounter for initial prescription of contraceptive pills: Secondary | ICD-10-CM

## 2022-12-24 ENCOUNTER — Other Ambulatory Visit: Payer: Self-pay | Admitting: Family Medicine

## 2022-12-24 DIAGNOSIS — Z30011 Encounter for initial prescription of contraceptive pills: Secondary | ICD-10-CM

## 2023-02-18 ENCOUNTER — Encounter: Payer: Self-pay | Admitting: Family Medicine

## 2023-02-23 ENCOUNTER — Other Ambulatory Visit: Payer: Self-pay | Admitting: Family Medicine

## 2023-06-18 ENCOUNTER — Ambulatory Visit (INDEPENDENT_AMBULATORY_CARE_PROVIDER_SITE_OTHER): Payer: Commercial Managed Care - HMO | Admitting: Primary Care

## 2023-06-18 VITALS — BP 132/86 | HR 97 | Temp 97.2°F | Ht 61.5 in | Wt 187.0 lb

## 2023-06-18 DIAGNOSIS — N898 Other specified noninflammatory disorders of vagina: Secondary | ICD-10-CM | POA: Diagnosis not present

## 2023-06-18 NOTE — Progress Notes (Signed)
Subjective:    Patient ID: Yvonne Trujillo, female    DOB: Aug 30, 1985, 38 y.o.   MRN: [id number]  HPI  Yvonne Trujillo is a very pleasant 38 y.o. female with a history of renal stones, irregular menses, pelvic pain, who presents today to discuss vaginal discomfort.  Symptom onset 4-5 months ago with internal vaginal dryness, discomfort with intercourse, whitish vaginal discharge. She took a one day dose of Monistat OTC with temporary improvement.  Symptoms returned shortly after.  She denies vaginal itching, foul smell, dysuria, external vaginal dryness or rashes. She is in a monogamous relationship. LMP was one week ago.   She will be establishing with OB/GYN next month.    Review of Systems  Gastrointestinal:  Negative for abdominal pain.  Genitourinary:  Positive for vaginal discharge. Negative for dysuria, frequency, hematuria and menstrual problem.       See HPI         Past Medical History:  Diagnosis Date   Asthma    Concussion    History of depression    Kidney infection    Kidney stone    Migraine    Supervision of normal first pregnancy, antepartum 06/23/2017    Clinic  [facility] Prenatal Labs Dating  Blood type:    Genetic Screen 1 Screen:    AFP:     Quad:     NIPS: Antibody:  Anatomic [place]  Rubella:   GTT Early:               Third trimester:  RPR:    Flu vaccine  HBsAg:    TDaP vaccine                                               Rhogam: HIV:   Baby Food        Breast                                        GBS: (For PCN allergy, check sensitivities) Cont    Social History   Socioeconomic History   Marital status: Single    Spouse name: Not on file   Number of children: Not on file   Years of education: Not on file   Highest education level: Associate degree: occupational, Scientist, product/process development, or vocational program  Occupational History   Not on file  Tobacco Use   Smoking status: Former    Types: Cigarettes   Smokeless tobacco: Never  Vaping Use   Vaping status: Never Used   Substance and Sexual Activity   Alcohol use: Yes    Comment: occ   Drug use: Not Currently    Comment: occ   Sexual activity: Yes    Birth control/protection: None  Other Topics Concern   Not on file  Social History Narrative   Not on file   Social Drivers of Health   Financial Resource Strain: Low Risk  (06/18/2023)   Overall Financial Resource Strain (CARDIA)    Difficulty of Paying Living Expenses: Not hard at all  Food Insecurity: No Food Insecurity (06/18/2023)   Hunger Vital Sign    Worried About Running Out of Food in the Last Year: Never true    Ran Out of Food in the Last Year: Never true  Transportation Needs: No Transportation Needs (06/18/2023)   PRAPARE - Administrator, Civil Service (Medical): No    Lack of Transportation (Non-Medical): No  Physical Activity: Inactive (06/18/2023)   Exercise Vital Sign    Days of Exercise per Week: 0 days    Minutes of Exercise per Session: 20 min  Stress: No Stress Concern Present (06/18/2023)   [employer] of Occupational Health - Occupational Stress Questionnaire    Feeling of Stress : Not at all  Social Connections: Moderately Integrated (06/18/2023)   Social Connection and Isolation Panel [NHANES]    Frequency of Communication with Friends and Family: More than three times a week    Frequency of Social Gatherings with Friends and Family: Once a week    Attends Religious Services: More than 4 times per year    Active Member of [employer] or Organizations: No    Attends Banker Meetings: Not on file    Marital Status: Living with partner  Intimate Partner Violence: Not At Risk (01/15/2018)   Humiliation, Afraid, Rape, and Kick questionnaire    Fear of Current or Ex-Partner: No    Emotionally Abused: No    Physically Abused: No    Sexually Abused: No    Past Surgical History:  Procedure Laterality Date   APPENDECTOMY     EXTRACORPOREAL SHOCK WAVE LITHOTRIPSY Left 07/09/2016   Procedure:  EXTRACORPOREAL SHOCK WAVE LITHOTRIPSY (ESWL);  Surgeon: Vanna Scotland, MD;  Location: [facility] ORS;  Service: Urology;  Laterality: Left;    Family History  Problem Relation Age of Onset   Alcohol abuse Maternal Uncle    Hyperlipidemia Mother    Hypertension Mother    Heart disease Mother    Hyperlipidemia Father    Hypertension Father    Diabetes Cousin    Bladder Cancer Neg Hx    Kidney cancer Neg Hx     Allergies  Allergen Reactions   Delsym [Dextromethorphan]     Makes her feel bad    Current Outpatient Medications on File Prior to Visit  Medication Sig Dispense Refill   albuterol (VENTOLIN HFA) 108 (90 Base) MCG/ACT inhaler Inhale 2 puffs into the lungs every 4 (four) hours as needed for wheezing or shortness of breath (cough, shortness of breath or wheezing.). 18 g 3   ALPRAZolam (XANAX) 0.5 MG tablet TAKE ONE TABLET TWICE DAILY AS NEEDED FOR ANXIETY 30 tablet 0   atorvastatin (LIPITOR) 20 MG tablet TAKE 1 TABLET BY MOUTH ONCE A DAY 90 tablet 1   buPROPion (WELLBUTRIN XL) 150 MG 24 hr tablet TAKE 1 TABLET BY MOUTH ONCE A DAY 90 tablet 1   fluticasone (FLONASE) 50 MCG/ACT nasal spray Place 2 sprays into both nostrils daily. 16 g 6   norethindrone-ethinyl estradiol-FE (BLISOVI FE 1/20) 1-20 MG-MCG tablet TAKE 1 TABLET BY MOUTH ONCE DAILY 84 tablet 2   ondansetron (ZOFRAN) 4 MG tablet TAKE ONE TABLET EVERY EIGHT HOURS AS NEEDED FOR NAUSEA / VOMITING 30 tablet 1   SUMAtriptan (IMITREX) 100 MG tablet TAKE ONE TABLET EVERY 2 HOURS AS NEEDED FOR MIRGRAINE MAY REPEAT IN 2HR 10 tablet 11   valACYclovir (VALTREX) 1000 MG tablet Take 2 pills by mouth twice daily for 1 day for a cold sore 4 tablet 3   omeprazole (PRILOSEC) 20 MG capsule Take 2 capsules (40 mg total) by mouth daily. 60 capsule 0   No current facility-administered medications on file prior to visit.    BP 132/86   Pulse 97  Temp (!) 97.2 F (36.2 C) (Temporal)   Ht 5' 1.5" (1.562 m)   Wt 187 lb (84.8 kg)   LMP  06/07/2023   SpO2 99%   BMI 34.76 kg/m  Objective:   Physical Exam Exam conducted with a chaperone present.  Cardiovascular:     Rate and Rhythm: Normal rate.  Pulmonary:     Effort: Pulmonary effort is normal.  Genitourinary:    Labia:        Right: No rash, tenderness or lesion.        Left: No rash, tenderness or lesion.      Vagina: Vaginal discharge present. No erythema.     Cervix: No cervical motion tenderness.     Comments: Small amount of thick, whitish cottage cheese like discharge to vaginal canal Neurological:     Mental Status: She is alert.           Assessment & Plan:  Vaginal discharge Assessment & Plan: HPI and PE suggestive of vaginal candidiasis v. bacterial vaginosis v. vaginal atopic dermatitis    Wet prep with C. Trachomatis/N. Gonorrheae RNA ordered - results pending   Treatment and recommendations pending results  Follow up with Ob-Gyn as previously scheduled  I evaluated patient, was consulted regarding treatment, and agree with assessment and plan per Julaine Fusi, MSN, FNP student.   Mayra Reel, NP-C   Orders: -     WET PREP BY MOLECULAR PROBE -     C. trachomatis/N. gonorrhoeae RNA        Doreene Nest, NP

## 2023-06-18 NOTE — Progress Notes (Signed)
Acute Office Visit  Subjective:     Patient ID: Yvonne Trujillo, female    DOB: [birth year], 38 y.o.   MRN: [id number]  Chief Complaint  Patient presents with   vaginal discomfort    Intermittent vaginal discomfort for 4-5 months Feels dryness, discharge, discomfort with sex Has appt next month with OB     HPI Yvonne Trujillo is a 38 year old female with hyperlipidemia, migraines, depression with anxiety, history of gestational hypertension, irregular menses who presents today to discuss intermittent vaginal discomfort for 4-5 months.   She reports internal vaginal irritation with dryness. There is no reported odor. There is no reported external genitalia irritation or itching. No dysuria. No pain, burning or itching with urination. Reports occasional discharge that appears white. She reports same partner x10 years and discomfort with sexual activity. Has not used lubricants. Reports sensitive skin but no recent changes to detergents or soaps.  She took Monistat x1 dose about 1 month ago for present symptoms and noticed temporary relief.   She reports history of yeast infections with antibiotic use. Last treatment was >6 months ago. She took fluconazole for this infection and had reaction with facial erythema and dryness, treated with oral steroids. She has taken this medication in the past without reaction.   LMP: 06/07/23 Wears pads during menstrual cycle  Birth control: Blisovi FE 1-20mg -mcg  OB-Gyn appointment scheduled next month.   Review of Systems  Constitutional: Negative.   Gastrointestinal: Negative.   Genitourinary:  Negative for dysuria, frequency and urgency.       Positive for vaginal discomfort, dryness, occasional discharge.         Objective:    BP 132/86   Pulse 97   Temp (!) 97.2 F (36.2 C) (Temporal)   Ht 5' 1.5" (1.562 m)   Wt 84.8 kg   LMP 06/07/2023   SpO2 99%   BMI 34.76 kg/m    Physical Exam Exam conducted with a chaperone present.  Constitutional:       Appearance: Normal appearance.  Cardiovascular:     Rate and Rhythm: Normal rate and regular rhythm.     Heart sounds: Normal heart sounds.  Pulmonary:     Effort: Pulmonary effort is normal.     Breath sounds: Normal breath sounds.  Genitourinary:    Exam position: Lithotomy position.     Pubic Area: No rash.      Labia:        Right: No rash or tenderness.        Left: No rash or tenderness.      Vagina: Vaginal discharge present. No erythema, bleeding or lesions.     Cervix: Normal. No erythema or cervical bleeding.     Comments: Thick, white, clumpy discharge Musculoskeletal:        General: Normal range of motion.     Cervical back: Normal range of motion and neck supple.  Skin:    General: Skin is warm and dry.  Neurological:     General: No focal deficit present.     Mental Status: She is alert and oriented to person, place, and time.     No results found for any visits on 06/18/23.      Assessment & Plan:   Problem List Items Addressed This Visit       Other   Vaginal discharge - Primary   HPI and PE suggestive of vaginal candidiasis v. bacterial vaginosis v. vaginal atopic dermatitis    Wet prep with C.  Trachomatis/N. Gonorrheae RNA ordered - results pending   Treatment and recommendations pending results  Follow up with Ob-Gyn as previously scheduled      Relevant Orders   WET PREP BY MOLECULAR PROBE   C. trachomatis/N. gonorrhoeae RNA    No orders of the defined types were placed in this encounter.   Follow up for routine physical examinations or sooner as needed   Lindell Spar, RN

## 2023-06-18 NOTE — Patient Instructions (Signed)
We will be in contact with your results once available.

## 2023-06-18 NOTE — Assessment & Plan Note (Addendum)
HPI and PE suggestive of vaginal candidiasis v. bacterial vaginosis v. vaginal atopic dermatitis    Wet prep with C. Trachomatis/N. Gonorrheae RNA ordered - results pending   Treatment and recommendations pending results  Follow up with Ob-Gyn as previously scheduled  I evaluated patient, was consulted regarding treatment, and agree with assessment and plan per Julaine Fusi, MSN, FNP student.   Mayra Reel, NP-C

## 2023-06-20 DIAGNOSIS — B3731 Acute candidiasis of vulva and vagina: Secondary | ICD-10-CM

## 2023-06-20 DIAGNOSIS — B9689 Other specified bacterial agents as the cause of diseases classified elsewhere: Secondary | ICD-10-CM

## 2023-06-21 ENCOUNTER — Other Ambulatory Visit: Payer: Self-pay | Admitting: Primary Care

## 2023-06-21 LAB — WET PREP BY MOLECULAR PROBE
Candida species: DETECTED — AB
MICRO NUMBER:: 16086261
SPECIMEN QUALITY:: ADEQUATE
Trichomonas vaginosis: NOT DETECTED

## 2023-06-21 LAB — C. TRACHOMATIS/N. GONORRHOEAE RNA
C. trachomatis RNA, TMA: NOT DETECTED
N. gonorrhoeae RNA, TMA: NOT DETECTED

## 2023-06-22 MED ORDER — METRONIDAZOLE 500 MG PO TABS: 500.0000 mg | ORAL_TABLET | Freq: Two times a day (BID) | ORAL | 0 refills | Status: AC

## 2023-06-22 MED ORDER — TERCONAZOLE 0.4 % VA CREA: 1.0000 | TOPICAL_CREAM | Freq: Every day | VAGINAL | 0 refills | Status: DC

## 2023-07-19 ENCOUNTER — Ambulatory Visit: Admitting: Family

## 2023-07-19 ENCOUNTER — Encounter: Payer: Self-pay | Admitting: Family

## 2023-07-19 VITALS — BP 116/70 | HR 92 | Temp 98.2°F | Ht 61.5 in | Wt 189.0 lb

## 2023-07-19 DIAGNOSIS — N898 Other specified noninflammatory disorders of vagina: Secondary | ICD-10-CM | POA: Diagnosis not present

## 2023-07-19 DIAGNOSIS — R109 Unspecified abdominal pain: Secondary | ICD-10-CM | POA: Diagnosis not present

## 2023-07-19 DIAGNOSIS — R319 Hematuria, unspecified: Secondary | ICD-10-CM | POA: Diagnosis not present

## 2023-07-19 LAB — POCT URINE DIPSTICK
Bilirubin, UA: NEGATIVE
Glucose, UA: NEGATIVE mg/dL
Ketones, POC UA: NEGATIVE mg/dL
Leukocytes, UA: NEGATIVE
Nitrite, UA: NEGATIVE
Spec Grav, UA: 1.025 (ref 1.010–1.025)
Urobilinogen, UA: 0.2 U/dL
pH, UA: 6 (ref 5.0–8.0)

## 2023-07-19 NOTE — Addendum Note (Signed)
 Addended by: Jaynee Eagles C on: 07/19/2023 08:48 AM   Modules accepted: Orders

## 2023-07-19 NOTE — Progress Notes (Signed)
 Established Patient Office Visit  Subjective:   Patient ID: Yvonne Trujillo, female    DOB: [birth year]  Age: 38 y.o. MRN: [id number]  CC:  Chief Complaint  Patient presents with   Acute Visit    Possible yeast infection or BV    HPI: Yvonne Trujillo is a 38 y.o. female presenting on 07/19/2023 for Acute Visit (Possible yeast infection or BV)  Positive BV and yeast 2/14 after office visit and pelvic exam, neg chlamydia or gonorrhea.  Completed metronidazole and terconazole (which she tolerated well, diflucan gives her facial redness) She does state that symptoms completely resolved after being treated.   Last week in feb she started feeling a bit 'off' again about two weeks ago.  Symptoms include lower pelvic cramping, vaginal odor more yeasty smell  No vaginal discharge, itching and or irritation.  No urinary frequency urgency or burning.  No flank pain.   Has not had sex since treatment in feb.  No new toiletries, cosmetics, wipes, toilet paper etc Doesn't use tampons, same unscented pads she always uses          ROS: Negative unless specifically indicated above in HPI.   Relevant past medical history reviewed and updated as indicated.   Allergies and medications reviewed and updated.   Current Outpatient Medications:    albuterol (VENTOLIN HFA) 108 (90 Base) MCG/ACT inhaler, Inhale 2 puffs into the lungs every 4 (four) hours as needed for wheezing or shortness of breath (cough, shortness of breath or wheezing.)., Disp: 18 g, Rfl: 3   ALPRAZolam (XANAX) 0.5 MG tablet, TAKE ONE TABLET TWICE DAILY AS NEEDED FOR ANXIETY, Disp: 30 tablet, Rfl: 0   atorvastatin (LIPITOR) 20 MG tablet, TAKE 1 TABLET BY MOUTH ONCE A DAY, Disp: 90 tablet, Rfl: 1   buPROPion (WELLBUTRIN XL) 150 MG 24 hr tablet, TAKE 1 TABLET BY MOUTH ONCE A DAY, Disp: 90 tablet, Rfl: 1   fluticasone (FLONASE) 50 MCG/ACT nasal spray, Place 2 sprays into both nostrils daily., Disp: 16 g, Rfl: 6   norethindrone-ethinyl  estradiol-FE (BLISOVI FE 1/20) 1-20 MG-MCG tablet, TAKE 1 TABLET BY MOUTH ONCE DAILY, Disp: 84 tablet, Rfl: 2   ondansetron (ZOFRAN) 4 MG tablet, TAKE ONE TABLET EVERY EIGHT HOURS AS NEEDED FOR NAUSEA / VOMITING, Disp: 30 tablet, Rfl: 1   SUMAtriptan (IMITREX) 100 MG tablet, TAKE ONE TABLET EVERY 2 HOURS AS NEEDED FOR MIRGRAINE MAY REPEAT IN 2HR, Disp: 10 tablet, Rfl: 11   valACYclovir (VALTREX) 1000 MG tablet, Take 2 pills by mouth twice daily for 1 day for a cold sore, Disp: 4 tablet, Rfl: 3   omeprazole (PRILOSEC) 20 MG capsule, Take 2 capsules (40 mg total) by mouth daily., Disp: 60 capsule, Rfl: 0  Allergies  Allergen Reactions   Delsym [Dextromethorphan]     Makes her feel bad   Diflucan [Fluconazole] Other (See Comments)    Facial redness    Objective:   BP 116/70 (BP Location: Left Arm, Patient Position: Sitting, Cuff Size: Large)   Pulse 92   Temp 98.2 F (36.8 C) (Temporal)   Ht 5' 1.5" (1.562 m)   Wt 189 lb (85.7 kg)   SpO2 98%   BMI 35.13 kg/m    Physical Exam Vitals reviewed.  Constitutional:      General: She is not in acute distress.    Appearance: Normal appearance. She is normal weight. She is not ill-appearing, toxic-appearing or diaphoretic.  Cardiovascular:     Rate and Rhythm: Normal  rate.  Pulmonary:     Effort: Pulmonary effort is normal.  Abdominal:     General: Abdomen is flat.     Tenderness: There is no abdominal tenderness. There is no right CVA tenderness or left CVA tenderness.  Neurological:     General: No focal deficit present.     Mental Status: She is alert and oriented to person, place, and time. Mental status is at baseline.     Motor: No weakness.  Psychiatric:        Mood and Affect: Mood normal.        Behavior: Behavior normal.        Thought Content: Thought content normal.        Judgment: Judgment normal.     Assessment & Plan:  Abdominal pain, unspecified abdominal location -     POCT URINE DIPSTICK  Hematuria,  unspecified type -     Urine Culture  Vaginal odor Assessment & Plan: Wet prep today  Pending results.  R/o BV yeast  Recent neg chlamydia gonorrhea        Follow up plan: Return for f/u PCP if no improvement in symptoms.  Mort Sawyers, FNP

## 2023-07-19 NOTE — Assessment & Plan Note (Signed)
 Wet prep today  Pending results.  R/o BV yeast  Recent neg chlamydia gonorrhea

## 2023-07-20 LAB — URINE CULTURE
MICRO NUMBER:: 16209706
Result:: NO GROWTH
SPECIMEN QUALITY:: ADEQUATE

## 2023-07-21 ENCOUNTER — Encounter: Payer: Self-pay | Admitting: Family

## 2023-07-21 DIAGNOSIS — R350 Frequency of micturition: Secondary | ICD-10-CM | POA: Insufficient documentation

## 2023-07-21 LAB — WET PREP BY MOLECULAR PROBE
Candida species: NOT DETECTED
Gardnerella vaginalis: NOT DETECTED
MICRO NUMBER:: 16209798
SPECIMEN QUALITY:: ADEQUATE
Trichomonas vaginosis: NOT DETECTED

## 2023-07-21 NOTE — Telephone Encounter (Signed)
 Please reach out to pt to drop off sample I put in order for lab urinalysis with reflex culture  Thanks

## 2023-07-21 NOTE — Telephone Encounter (Signed)
 Thanks for the heads up  Your tests were negative but given increasing symptoms I would like to get another urinalysis when you can come by and leave a sample

## 2023-07-22 ENCOUNTER — Ambulatory Visit: Admitting: Family Medicine

## 2023-07-26 NOTE — Telephone Encounter (Signed)
 LVM to schedule for Lab only apt

## 2023-07-26 NOTE — Telephone Encounter (Signed)
 Wasn't able to leave vm

## 2023-07-27 ENCOUNTER — Other Ambulatory Visit (HOSPITAL_COMMUNITY)
Admission: RE | Admit: 2023-07-27 | Discharge: 2023-07-27 | Disposition: A | Discharge status: Home / Self Care | Source: Ambulatory Visit | Attending: Obstetrics and Gynecology | Admitting: Obstetrics and Gynecology

## 2023-07-27 ENCOUNTER — Encounter: Payer: Self-pay | Admitting: Obstetrics and Gynecology

## 2023-07-27 ENCOUNTER — Ambulatory Visit: Payer: Commercial Managed Care - HMO | Admitting: Obstetrics and Gynecology

## 2023-07-27 VITALS — BP 134/86 | HR 112 | Ht 61.0 in | Wt 187.0 lb

## 2023-07-27 DIAGNOSIS — R1030 Lower abdominal pain, unspecified: Secondary | ICD-10-CM | POA: Diagnosis not present

## 2023-07-27 DIAGNOSIS — Z01419 Encounter for gynecological examination (general) (routine) without abnormal findings: Secondary | ICD-10-CM

## 2023-07-27 DIAGNOSIS — Z30011 Encounter for initial prescription of contraceptive pills: Secondary | ICD-10-CM

## 2023-07-27 LAB — POCT URINALYSIS DIPSTICK
Bilirubin, UA: NEGATIVE
Blood, UA: NEGATIVE
Glucose, UA: NEGATIVE
Ketones, UA: NEGATIVE
Leukocytes, UA: NEGATIVE
Nitrite, UA: NEGATIVE
Protein, UA: NEGATIVE
Spec Grav, UA: 1.01 (ref 1.010–1.025)

## 2023-07-27 MED ORDER — BLISOVI FE 1/20 1-20 MG-MCG PO TABS: 1.0000 | ORAL_TABLET | Freq: Every day | ORAL | 3 refills | Status: DC

## 2023-07-27 NOTE — Progress Notes (Signed)
 Patient presents for Annual.  LMP: 07/05/23 Monthly  Last pap:  06/07/20 WNL Will do pap today.  Contraception: OCP Junel wants to discuss a 3 month BC pill.  Mammogram: Not yet indicated No Family Hx of Breast Cancer STD Screening:  Already had done at PCP    CC:  last month had BV and yeast infection treated w/ Rx. Pt notes vaginal discomfort pt unsure if it's a UTI due to lower abdominal pain/ vaginal issue  notes vaginal odor that comes and goes. Had Urine Cx 07/19/23 pt has not been able to have intercourse due to discomfort.

## 2023-07-27 NOTE — Progress Notes (Signed)
 Subjective:     Yvonne Trujillo is a 38 y.o. female P1 with LMP 07/05/23 and BMI 35 who is here for a comprehensive physical exam. The patient reports vaginal discomfort. She was recently treated for BV and yeast infection. She also reports some urgency without frequency. She is sexually active using birth control pills for contraception. She denies any pain or abnormal discharge. She is without any other complaints.   Past Medical History:  Diagnosis Date   Asthma    Concussion    History of depression    Kidney infection    Kidney stone    Migraine    Supervision of normal first pregnancy, antepartum 06/23/2017    Clinic  [facility] Prenatal Labs Dating  Blood type:    Genetic Screen 1 Screen:    AFP:     Quad:     NIPS: Antibody:  Anatomic [place]  Rubella:   GTT Early:               Third trimester:  RPR:    Flu vaccine  HBsAg:    TDaP vaccine                                               Rhogam: HIV:   Baby Food        Breast                                        GBS: (For PCN allergy, check sensitivities) Cont   Past Surgical History:  Procedure Laterality Date   APPENDECTOMY     EXTRACORPOREAL SHOCK WAVE LITHOTRIPSY Left 07/09/2016   Procedure: EXTRACORPOREAL SHOCK WAVE LITHOTRIPSY (ESWL);  Surgeon: Vanna Scotland, MD;  Location: [facility] ORS;  Service: Urology;  Laterality: Left;   Family History  Problem Relation Age of Onset   Alcohol abuse Maternal Uncle    Hyperlipidemia Mother    Hypertension Mother    Heart disease Mother    Hyperlipidemia Father    Hypertension Father    Diabetes Cousin    Bladder Cancer Neg Hx    Kidney cancer Neg Hx     Social History   Socioeconomic History   Marital status: Single    Spouse name: Not on file   Number of children: Not on file   Years of education: Not on file   Highest education level: Associate degree: occupational, Scientist, product/process development, or vocational program  Occupational History   Not on file  Tobacco Use   Smoking status: Former    Types:  Cigarettes   Smokeless tobacco: Never  Vaping Use   Vaping status: Never Used  Substance and Sexual Activity   Alcohol use: Yes    Comment: occ   Drug use: Not Currently    Comment: occ   Sexual activity: Yes    Birth control/protection: None  Other Topics Concern   Not on file  Social History Narrative   Not on file   Social Drivers of Health   Financial Resource Strain: Low Risk  (06/18/2023)   Overall Financial Resource Strain (CARDIA)    Difficulty of Paying Living Expenses: Not hard at all  Food Insecurity: No Food Insecurity (06/18/2023)   Hunger Vital Sign    Worried About Running Out of Food in  the Last Year: Never true    Ran Out of Food in the Last Year: Never true  Transportation Needs: No Transportation Needs (06/18/2023)   PRAPARE - Administrator, Civil Service (Medical): No    Lack of Transportation (Non-Medical): No  Physical Activity: Inactive (06/18/2023)   Exercise Vital Sign    Days of Exercise per Week: 0 days    Minutes of Exercise per Session: 20 min  Stress: No Stress Concern Present (06/18/2023)   [employer] of Occupational Health - Occupational Stress Questionnaire    Feeling of Stress : Not at all  Social Connections: Moderately Integrated (06/18/2023)   Social Connection and Isolation Panel [NHANES]    Frequency of Communication with Friends and Family: More than three times a week    Frequency of Social Gatherings with Friends and Family: Once a week    Attends Religious Services: More than 4 times per year    Active Member of [employer] or Organizations: No    Attends Engineer, structural: Not on file    Marital Status: Living with partner  Intimate Partner Violence: Not At Risk (01/15/2018)   Humiliation, Afraid, Rape, and Kick questionnaire    Fear of Current or Ex-Partner: No    Emotionally Abused: No    Physically Abused: No    Sexually Abused: No   Health Maintenance  Topic Date Due   COVID-19 Vaccine (3 - Pfizer  risk series) 06/26/2019   INFLUENZA VACCINE  08/02/2023 (Originally 12/03/2022)   Cervical Cancer Screening (HPV/Pap Cotest)  06/07/2025   DTaP/Tdap/Td (3 - Td or Tdap) 10/29/2027   Hepatitis C Screening  Completed   HIV Screening  Completed   HPV VACCINES  Aged Out       Review of Systems Pertinent items noted in HPI and remainder of comprehensive ROS otherwise negative.   Objective:  Blood pressure 134/86, pulse (!) 112, height 5\' 1"  (1.549 m), weight 187 lb (84.8 kg), last menstrual period 07/05/2023.   GENERAL: Well-developed, well-nourished female in no acute distress.  HEENT: Normocephalic, atraumatic. Sclerae anicteric.  NECK: Supple. Normal thyroid.  LUNGS: Clear to auscultation bilaterally.  HEART: Regular rate and rhythm. BREASTS: Symmetric in size. No palpable masses or lymphadenopathy, skin changes, or nipple drainage. ABDOMEN: Soft, nontender, nondistended. No organomegaly. PELVIC: Normal external female genitalia. Vagina is pink and rugated.  Normal discharge. Normal appearing cervix. Uterus is normal in size. No adnexal mass or tenderness. Chaperone present during the pelvic exam EXTREMITIES: No cyanosis, clubbing, or edema, 2+ distal pulses.     Assessment:    Healthy female exam.      Plan:    Pap smear collected Vaginal swab collected Urine culture collected Patient will be contacted with abnormal results See After Visit Summary for Counseling Recommendations

## 2023-07-28 LAB — CERVICOVAGINAL ANCILLARY ONLY
Bacterial Vaginitis (gardnerella): NEGATIVE
Candida Glabrata: NEGATIVE
Candida Vaginitis: NEGATIVE
Comment: NEGATIVE
Comment: NEGATIVE
Comment: NEGATIVE

## 2023-07-29 ENCOUNTER — Encounter: Payer: Self-pay | Admitting: Obstetrics and Gynecology

## 2023-07-29 LAB — URINE CULTURE

## 2023-08-02 LAB — CYTOLOGY - PAP
Comment: NEGATIVE
Diagnosis: UNDETERMINED — AB
High risk HPV: NEGATIVE

## 2023-08-17 ENCOUNTER — Other Ambulatory Visit: Payer: Self-pay | Admitting: Family Medicine

## 2023-12-16 ENCOUNTER — Other Ambulatory Visit: Payer: Self-pay | Admitting: Family Medicine

## 2023-12-16 NOTE — Telephone Encounter (Unsigned)
 Copied from CRM [id number]. Topic: Clinical - Medication Refill >> Dec 16, 2023  1:28 PM Suzen RAMAN wrote: Medication: ondansetron  (ZOFRAN ) 4 MG tablet, SUMAtriptan  (IMITREX ) 100 MG tablet   Has the patient contacted their pharmacy? Yes  This is the patient's preferred pharmacy:  [facility] [place], [place] - [address] [place] [address] Phone: [phone] Fax: [phone]  Is this the correct pharmacy for this prescription? Yes If no, delete pharmacy and type the correct one.   Has the prescription been filled recently? No  Is the patient out of the medication? Yes  Has the patient been seen for an appointment in the last year OR does the patient have an upcoming appointment? Yes  Can we respond through MyChart? Yes  Agent: Please be advised that Rx refills may take up to 3 business days. We ask that you follow-up with your pharmacy.

## 2023-12-17 ENCOUNTER — Encounter: Payer: Self-pay | Admitting: Family Medicine

## 2023-12-17 MED ORDER — SUMATRIPTAN SUCCINATE 100 MG PO TABS: ORAL_TABLET | ORAL | 0 refills | Status: AC

## 2023-12-17 MED ORDER — ONDANSETRON HCL 4 MG PO TABS: ORAL_TABLET | ORAL | 0 refills | Status: AC

## 2023-12-17 NOTE — Telephone Encounter (Signed)
 Pt called to check status of refills. FYI to PCP

## 2023-12-17 NOTE — Telephone Encounter (Signed)
 Please schedule annual exam when able

## 2023-12-17 NOTE — Telephone Encounter (Signed)
 Last OV with PCP was a CPE on 11/27/22 (over a year), no future appts.,  Zofran  last filled on 01/15/22 and Imitrex  last filled on 09/03/20  Please review

## 2023-12-20 NOTE — Telephone Encounter (Signed)
 lvm for pt to call office to schedule appt.

## 2023-12-21 NOTE — Telephone Encounter (Unsigned)
 Copied from CRM [id number]. Topic: Appointments - Scheduling Inquiry for Clinic >> Dec 21, 2023 12:42 PM Mesmerise C wrote: Reason for CRM: Patient trying to schedule a yearly physical on chart shows a new physical done at [facility] on 3/25 when trying to schedule gives options of next year in March, patient stated that nurse told her needs a physical done before getting refill of her meds since last one was 11/27/22 patient can be reached at [phone]

## 2023-12-21 NOTE — Telephone Encounter (Signed)
 Lvmtcb, sent mychart message

## 2023-12-24 ENCOUNTER — Encounter (HOSPITAL_BASED_OUTPATIENT_CLINIC_OR_DEPARTMENT_OTHER): Payer: Self-pay

## 2023-12-24 ENCOUNTER — Other Ambulatory Visit: Payer: Self-pay

## 2023-12-24 ENCOUNTER — Emergency Department (HOSPITAL_BASED_OUTPATIENT_CLINIC_OR_DEPARTMENT_OTHER)
Admission: EM | Admit: 2023-12-24 | Discharge: 2023-12-24 | Disposition: A | Discharge status: Home / Self Care | Attending: Emergency Medicine | Admitting: Emergency Medicine

## 2023-12-24 ENCOUNTER — Emergency Department (HOSPITAL_BASED_OUTPATIENT_CLINIC_OR_DEPARTMENT_OTHER)

## 2023-12-24 DIAGNOSIS — J45909 Unspecified asthma, uncomplicated: Secondary | ICD-10-CM | POA: Diagnosis not present

## 2023-12-24 DIAGNOSIS — Z7951 Long term (current) use of inhaled steroids: Secondary | ICD-10-CM | POA: Diagnosis not present

## 2023-12-24 DIAGNOSIS — R110 Nausea: Secondary | ICD-10-CM | POA: Insufficient documentation

## 2023-12-24 DIAGNOSIS — R1011 Right upper quadrant pain: Secondary | ICD-10-CM | POA: Diagnosis not present

## 2023-12-24 DIAGNOSIS — R1013 Epigastric pain: Secondary | ICD-10-CM | POA: Insufficient documentation

## 2023-12-24 DIAGNOSIS — R197 Diarrhea, unspecified: Secondary | ICD-10-CM | POA: Diagnosis not present

## 2023-12-24 LAB — COMPREHENSIVE METABOLIC PANEL WITH GFR
ALT: 11 U/L (ref 0–44)
AST: 18 U/L (ref 15–41)
Albumin: 4.4 g/dL (ref 3.5–5.0)
Alkaline Phosphatase: 95 U/L (ref 38–126)
Anion gap: 12 (ref 5–15)
BUN: 10 mg/dL (ref 6–20)
CO2: 20 mmol/L — ABNORMAL LOW (ref 22–32)
Calcium: 9.5 mg/dL (ref 8.9–10.3)
Chloride: 103 mmol/L (ref 98–111)
Creatinine, Ser: 0.73 mg/dL (ref 0.44–1.00)
GFR, Estimated: >60 mL/min (ref >60)
Glucose, Bld: 104 mg/dL — ABNORMAL HIGH (ref 70–99)
Potassium: 4.1 mmol/L (ref 3.5–5.1)
Sodium: 136 mmol/L (ref 135–145)
Total Bilirubin: 0.5 mg/dL (ref 0.0–1.2)
Total Protein: 8.4 g/dL — ABNORMAL HIGH (ref 6.5–8.1)

## 2023-12-24 LAB — URINALYSIS, ROUTINE W REFLEX MICROSCOPIC
Bilirubin Urine: NEGATIVE
Glucose, UA: NEGATIVE mg/dL
Ketones, ur: NEGATIVE mg/dL
Leukocytes,Ua: NEGATIVE
Nitrite: NEGATIVE
Protein, ur: NEGATIVE mg/dL
Specific Gravity, Urine: 1.025 (ref 1.005–1.030)
pH: 5.5 (ref 5.0–8.0)

## 2023-12-24 LAB — URINALYSIS, MICROSCOPIC (REFLEX)

## 2023-12-24 LAB — CBC
HCT: 43.8 % (ref 36.0–46.0)
Hemoglobin: 15 g/dL (ref 12.0–15.0)
MCH: 29.5 pg (ref 26.0–34.0)
MCHC: 34.2 g/dL (ref 30.0–36.0)
MCV: 86.1 fL (ref 80.0–100.0)
Platelets: 424 K/uL — ABNORMAL HIGH (ref 150–400)
RBC: 5.09 MIL/uL (ref 3.87–5.11)
RDW: 11.5 % (ref 11.5–15.5)
WBC: 20.2 K/uL — ABNORMAL HIGH (ref 4.0–10.5)
nRBC: 0 % (ref 0.0–0.2)

## 2023-12-24 LAB — LIPASE, BLOOD: Lipase: 17 U/L (ref 11–51)

## 2023-12-24 LAB — TROPONIN T, HIGH SENSITIVITY: Troponin T High Sensitivity: <15 ng/L (ref 0–19)

## 2023-12-24 LAB — PREGNANCY, URINE: Preg Test, Ur: NEGATIVE

## 2023-12-24 MED ORDER — ALUM & MAG HYDROXIDE-SIMETH 200-200-20 MG/5ML PO SUSP
30.0000 mL | Freq: Once | ORAL | Status: AC
  Administered 2023-12-24: 30 mL via ORAL
  Filled 2023-12-24: qty 30

## 2023-12-24 MED ORDER — LOPERAMIDE HCL 2 MG PO CAPS
4.0000 mg | ORAL_CAPSULE | Freq: Once | ORAL | Status: AC
  Administered 2023-12-24: 4 mg via ORAL
  Filled 2023-12-24: qty 2

## 2023-12-24 NOTE — ED Notes (Signed)
 Pt ambulated to bathroom. States she had diarrhea again.

## 2023-12-24 NOTE — ED Notes (Signed)
 Pt provided water for PO challenge

## 2023-12-24 NOTE — ED Provider Notes (Signed)
 [facility] Provider Note   CSN: [id number] Arrival date & time: 12/24/23  9273     Patient presents with: Abdominal Pain   Yvonne Trujillo is a 38 y.o. female.  38 year old female presents to the ED with complaints of epigastric abdominal pain since last night.  Significant history of gallstones, appendectomy, lithotripsy.  Patient has associated nausea, chills, and diarrhea.  Patient is described as a burning sensation epigastrium area with no radiation.  Mild right upper quadrant pain currently but 10 out of 10 pain at 1230.  Patient has had 4 episodes of diarrhea since midnight.  She described them watery in nature.  Patient denies fever.  She has had lithotripsy in the past and gallstones in the past, patient reports pain feels like gallstones.  She is tried Zofran  and omeprazole  at home with some relief.  Medications and allergies reviewed.     Prior to Admission medications   Medication Sig Start Date End Date Taking? Authorizing Provider  albuterol  (VENTOLIN  HFA) 108 (90 Base) MCG/ACT inhaler Inhale 2 puffs into the lungs every 4 (four) hours as needed for wheezing or shortness of breath (cough, shortness of breath or wheezing.). 08/09/19   Arloa Suzen RAMAN, NP  ALPRAZolam  (XANAX ) 0.5 MG tablet TAKE ONE TABLET TWICE DAILY AS NEEDED FOR ANXIETY 09/03/20   Tower, Laine LABOR, MD  atorvastatin  (LIPITOR) 20 MG tablet TAKE ONE TABLET BY MOUTH ONCE A DAY 08/17/23   Tower, Laine LABOR, MD  buPROPion  (WELLBUTRIN  XL) 150 MG 24 hr tablet TAKE ONE TABLET BY MOUTH ONCE A DAY 08/17/23   Tower, Laine LABOR, MD  fluticasone  (FLONASE ) 50 MCG/ACT nasal spray Place 2 sprays into both nostrils daily. 12/20/18   Tower, Laine LABOR, MD  norethindrone-ethinyl estradiol -FE (BLISOVI  FE 1/20) 1-20 MG-MCG tablet Take 1 tablet by mouth daily. Skipping placebo pills, allowing for menses every 3 months 07/27/23   Constant, Peggy, MD  omeprazole  (PRILOSEC) 20 MG capsule Take 2 capsules (40 mg  total) by mouth daily. 10/20/21 11/27/22  Loeffler, Ronnald BROCKS, PA-C  ondansetron  (ZOFRAN ) 4 MG tablet TAKE ONE TABLET EVERY EIGHT HOURS AS NEEDED FOR NAUSEA / VOMITING 12/17/23   Tower, Laine LABOR, MD  SUMAtriptan  (IMITREX ) 100 MG tablet May repeat in 2 hours if headache persists or recurs.TAKE ONE TABLET EVERY 2 HOURS AS NEEDED FOR MIRGRAINE MAY REPEAT IN 2HR 12/17/23   Tower, Laine LABOR, MD  valACYclovir  (VALTREX ) 1000 MG tablet Take 2 pills by mouth twice daily for 1 day for a cold sore 09/23/18   Tower, Laine LABOR, MD    Allergies: Delsym [dextromethorphan] and Diflucan  [fluconazole ]    Review of Systems  Constitutional:  Positive for fatigue.  Gastrointestinal:  Positive for abdominal pain, diarrhea and nausea. Negative for blood in stool and vomiting.  All other systems reviewed and are negative.   Updated Vital Signs BP 117/85 (BP Location: Right Arm)   Pulse (!) 103   Temp 98.2 F (36.8 C) (Oral)   Resp 18   Ht 5' 1 (1.549 m)   Wt 88.5 kg   LMP 12/18/2023 (Exact Date)   SpO2 100%   BMI 36.84 kg/m   Physical Exam Vitals and nursing note reviewed.  Constitutional:      General: She is not in acute distress.    Appearance: Normal appearance. She is well-developed. She is not ill-appearing.  HENT:     Head: Normocephalic and atraumatic.  Eyes:     Extraocular Movements: Extraocular  movements intact.     Pupils: Pupils are equal, round, and reactive to light.  Cardiovascular:     Rate and Rhythm: Normal rate.  Pulmonary:     Effort: Pulmonary effort is normal. No respiratory distress.  Abdominal:     General: Bowel sounds are normal.     Palpations: Abdomen is soft.     Tenderness: There is abdominal tenderness in the right upper quadrant. There is no guarding. Negative signs include Murphy's sign.  Musculoskeletal:        General: Normal range of motion.     Cervical back: Normal range of motion.  Skin:    General: Skin is warm and dry.  Neurological:     General: No focal  deficit present.     Mental Status: She is alert.  Psychiatric:        Mood and Affect: Mood normal.        Behavior: Behavior normal.     (all labs ordered are listed, but only abnormal results are displayed) Labs Reviewed  COMPREHENSIVE METABOLIC PANEL WITH GFR - Abnormal; Notable for the following components:      Result Value   CO2 20 (*)    Glucose, Bld 104 (*)    Total Protein 8.4 (*)    All other components within normal limits  CBC - Abnormal; Notable for the following components:   WBC 20.2 (*)    Platelets 424 (*)    All other components within normal limits  URINALYSIS, ROUTINE W REFLEX MICROSCOPIC - Abnormal; Notable for the following components:   Hgb urine dipstick TRACE (*)    All other components within normal limits  URINALYSIS, MICROSCOPIC (REFLEX) - Abnormal; Notable for the following components:   Bacteria, UA RARE (*)    All other components within normal limits  LIPASE, BLOOD  PREGNANCY, URINE  TROPONIN T, HIGH SENSITIVITY    EKG: EKG Interpretation Date/Time:  Friday December 24 2023 08:03:21 EDT Ventricular Rate:  96 PR Interval:  157 QRS Duration:  93 QT Interval:  336 QTC Calculation: 425 R Axis:   37  Text Interpretation: Sinus rhythm Confirmed by Darra Chew [phone]) on 12/24/2023 8:04:50 AM  Radiology: US  Abdomen Limited RUQ (LIVER/GB) Result Date: 12/24/2023 CLINICAL DATA:  RIGHT upper quadrant abdominal pain. Diarrhea after eating pizza last night. EXAM: ULTRASOUND ABDOMEN LIMITED RIGHT UPPER QUADRANT COMPARISON:  None Available. FINDINGS: Gallbladder: Gallstones: None Sludge: None Gallbladder Wall: Within normal limits Pericholecystic fluid: None Sonographic Murphy's Sign: Negative per technologist Common bile duct: Diameter: 2 mm Liver: Parenchymal echogenicity: Within normal limits Contours: Normal Lesions: None Portal vein: Patent.  Hepatopetal flow Other: None. IMPRESSION: No significant sonographic abnormality of the liver or gallbladder.  Electronically Signed   By: Aliene Lloyd M.D.   On: 12/24/2023 08:37     Procedures   Medications Ordered in the ED  alum & mag hydroxide-simeth (MAALOX/MYLANTA) 200-200-20 MG/5ML suspension 30 mL (30 mLs Oral Given 12/24/23 9147)  loperamide  (IMODIUM ) capsule 4 mg (4 mg Oral Given 12/24/23 9076)    38 y.o. female presents to the ED for concern of Abdominal Pain     This involves an extensive number of treatment options, and is a complaint that carries with it a high risk of complications and morbidity.  The differential diagnosis prior to evaluation includes, but is not limited to: ACS, PE, aortic aneurysm/dissection, cholecystitis, gastritis, nephrolithiasis, bowel obstruction, GERD  This is not an exhaustive differential.   Past Medical History / Co-morbidities / Social  History: Hx of appendectomy, GERD, depression with anxiety, nephrolithiasis, asthma Social Determinants of Health include: Former smoker, social alcohol.  Additional History:  Obtained by chart review.  Notably primary care management of GERD depression anxiety and migraines.  Lab Tests: I ordered, and personally interpreted labs.  The pertinent results include:   Increased white count of 20  Imaging Studies: I ordered imaging studies including right upper quadrant ultrasound.   I independently visualized and interpreted imaging which showed no gallstones present or acute inflammation noted.  I agree with the radiologist interpretation.  Cardiac Monitoring: The patient was maintained on a cardiac monitor.  I personally viewed and interpreted the cardiac monitored which showed an underlying rhythm of: Sinus rhythm.   ED Course / Critical Interventions: Pt well-appearing on exam sitting comfortably on ED bed.  Patient has mild right upper quadrant pain and epigastric pain.  Negative Murphy signs.  Bowel sounds present.  No other tenderness throughout abdomen.  Patient reporting current 4 out of 10 pain in  epigastrium.  Denies shortness of breath lungs are clear to auscultation in all fields.  No current chest pain.  Patient is not currently complaining of any associated symptoms and previously took omeprazole  and Zofran  with some relief.   Upon reevaluation, patient complaining of any symptoms and has not had any nausea or vomiting while in ED.  Patient has mild tenderness in the epigastric area to palpation.  Heart rate has dropped to the 80s without intervention and patient has no shortness of breath or chest pain.  Troponin is negative and EKG shows no evidence of acute ischemia.  Patient had episode of diarrhea after p.o. challenge, patient was given Imodium  prior to discharge.  Patient was advised on precautions for returning to the ED and signs and symptoms to monitor for.  Patient agreed with treatment plan and was comfortable with discharge. I have reviewed the patients home medicines and have made adjustments as needed.  Disposition: Considered admission and after reviewing the patient's encounter today, I feel that the patient would benefit from discharge.  Discussed course of treatment with the patient, whom demonstrated understanding.  Patient in agreement and has no further questions.    I discussed this case with my attending, Dr. Darra, who agreed with the proposed treatment course and cosigned this note including patient's presenting symptoms, physical exam, and planned diagnostics and interventions.  Attending physician stated agreement with plan or made changes to plan which were implemented.     This chart was dictated using voice recognition software.  Despite best efforts to proofread, errors can occur which can change the documentation meaning.   Final diagnoses:  Epigastric pain  Diarrhea, unspecified type    ED Discharge Orders     None          Myriam Fonda GORMAN DEVONNA 12/24/23 1010    Darra Fonda MATSU, MD 12/24/23 1018

## 2023-12-24 NOTE — ED Triage Notes (Signed)
 Pt reports some upper abdominal pain since midnight. Reports cold chills, nausea and diarrhea. Reports that she took omeprazole  and it only helped for a short time.

## 2023-12-24 NOTE — Discharge Instructions (Addendum)
 Labs today were negative at your hospital visit besides elevated white count.  It is important to return to the ED for worsening symptoms including severe abdominal pain, chest pain, high fevers, shortness of breath, dizziness, weakness.  Take Zofran  for nausea and current medication regimen for acid reflux. You can also add Maalox over the counter for management of acid reflux.

## 2024-01-06 ENCOUNTER — Ambulatory Visit (INDEPENDENT_AMBULATORY_CARE_PROVIDER_SITE_OTHER): Admitting: Family Medicine

## 2024-01-06 ENCOUNTER — Encounter: Payer: Self-pay | Admitting: Family Medicine

## 2024-01-06 VITALS — BP 144/78 | HR 92 | Temp 98.8°F | Ht 61.0 in | Wt 193.5 lb

## 2024-01-06 DIAGNOSIS — N949 Unspecified condition associated with female genital organs and menstrual cycle: Secondary | ICD-10-CM | POA: Diagnosis not present

## 2024-01-06 DIAGNOSIS — Z23 Encounter for immunization: Secondary | ICD-10-CM | POA: Diagnosis not present

## 2024-01-06 LAB — POCT WET PREP (WET MOUNT)
Clue Cells Wet Prep Whiff POC: POSITIVE
Trichomonas Wet Prep HPF POC: ABSENT

## 2024-01-06 MED ORDER — METRONIDAZOLE 500 MG PO TABS: 500.0000 mg | ORAL_TABLET | Freq: Two times a day (BID) | ORAL | 0 refills | Status: AC

## 2024-01-06 NOTE — Assessment & Plan Note (Signed)
 Discomfort and some odor  History of BV in past with these symptoms Clue cells and positive whiff on wet prep  Reassuring exam  Will treat with oral metronidazole  500 mg bid 7d  Update if not starting to improve in a week or if worsening  Call back and Er precautions noted in detail today

## 2024-01-06 NOTE — Progress Notes (Signed)
 Subjective:    Patient ID: Yvonne Trujillo, female    DOB: 05-Feb-1986, 38 y.o.   MRN: [id number]  HPI  Wt Readings from Last 3 Encounters:  01/06/24 193 lb 8 oz (87.8 kg)  12/24/23 195 lb (88.5 kg)  07/27/23 187 lb (84.8 kg)   36.56 kg/m  Vitals:   01/06/24 0932  BP: (!) 144/78  Pulse: 92  Temp: 98.8 F (37.1 C)  SpO2: 100%    Pt presents with  Vaginal discomfort   Started this weekend A little odor  Some tenderness left side of vulva and inside  No discharge    No lumps   No STD exposure  No new sexual contacts  No vaginal dryness  History of cold sores but no genital herpes   On 1/20 oral contraceptive   Is prone to BV with similar symptoms   Results for orders placed or performed in visit on 01/06/24  POCT Wet Prep [facility])   Collection Time: 01/06/24 10:00 AM  Result Value Ref Range   Source Wet Prep POC vaginal    WBC, Wet Prep HPF POC few    Bacteria Wet Prep HPF POC Moderate (A) Few   Bacteria Wet Prep Morphology POC     Clue Cells Wet Prep HPF POC Moderate (A) None   Clue Cells Wet Prep Whiff POC Positive Whiff    Yeast Wet Prep HPF POC None None   KOH Wet Prep POC None None   Trichomonas Wet Prep HPF POC Absent Absent       Patient Active Problem List   Diagnosis Date Noted   Vaginal discomfort 01/06/2024   Urinary frequency 07/21/2023   Elevated platelet count 12/16/2022   Current use of proton pump inhibitor 11/27/2022   Irregular menses 11/13/2021   Morbid obesity (HCC) 12/20/2018   History of gestational hypertension 01/15/2018   Herpes simplex 01/18/2015   Hyperlipidemia 04/25/2013   Migraine 04/24/2013   Depression with anxiety 04/24/2013   Hirsutism 04/24/2013   Acne 04/24/2013   History of kidney stones 04/24/2013   Past Medical History:  Diagnosis Date   Allergy    Anxiety    Asthma    Concussion    Depression    History of depression    Kidney infection    Kidney stone    Migraine    Supervision of normal  first pregnancy, antepartum 06/23/2017    Clinic  [facility] Prenatal Labs Dating  Blood type:    Genetic Screen 1 Screen:    AFP:     Quad:     NIPS: Antibody:  Anatomic US   Rubella:   GTT Early:               Third trimester:  RPR:    Flu vaccine  HBsAg:    TDaP vaccine                                               Rhogam: HIV:   Baby Food        Breast                                        GBS: (For PCN allergy, check sensitivities) Cont   Past Surgical History:  Procedure Laterality  Date   APPENDECTOMY     EXTRACORPOREAL SHOCK WAVE LITHOTRIPSY Left 07/09/2016   Procedure: EXTRACORPOREAL SHOCK WAVE LITHOTRIPSY (ESWL);  Surgeon: Rosina Riis, MD;  Location: [facility] ORS;  Service: Urology;  Laterality: Left;   Social History   Tobacco Use   Smoking status: Former    Types: Cigarettes   Smokeless tobacco: Never  Vaping Use   Vaping status: Never Used  Substance Use Topics   Alcohol use: Yes    Comment: occ   Drug use: Not Currently    Comment: occ   Family History  Problem Relation Age of Onset   Alcohol abuse Maternal Uncle    Hyperlipidemia Mother    Hypertension Mother    Heart disease Mother    Hyperlipidemia Father    Hypertension Father    Diabetes Cousin    Bladder Cancer Neg Hx    Kidney cancer Neg Hx    Allergies  Allergen Reactions   Delsym [Dextromethorphan]     Makes her feel bad   Diflucan  [Fluconazole ] Other (See Comments)    Facial redness   Current Outpatient Medications on File Prior to Visit  Medication Sig Dispense Refill   albuterol  (VENTOLIN  HFA) 108 (90 Base) MCG/ACT inhaler Inhale 2 puffs into the lungs every 4 (four) hours as needed for wheezing or shortness of breath (cough, shortness of breath or wheezing.). 18 g 3   ALPRAZolam  (XANAX ) 0.5 MG tablet TAKE ONE TABLET TWICE DAILY AS NEEDED FOR ANXIETY 30 tablet 0   amphetamine-dextroamphetamine (ADDERALL) 10 MG tablet Take 10 mg by mouth 2 (two) times daily.     atorvastatin  (LIPITOR) 20 MG tablet TAKE  ONE TABLET BY MOUTH ONCE A DAY 90 tablet 1   buPROPion  (WELLBUTRIN  XL) 150 MG 24 hr tablet TAKE ONE TABLET BY MOUTH ONCE A DAY 90 tablet 1   fluticasone  (FLONASE ) 50 MCG/ACT nasal spray Place 2 sprays into both nostrils daily. 16 g 6   NON FORMULARY Inject 40 Units into the skin once a week. SEMAGLUTIDE/B12 3MG /0.5MG /ML INJECTION     norethindrone-ethinyl estradiol -FE (BLISOVI  FE 1/20) 1-20 MG-MCG tablet Take 1 tablet by mouth daily. Skipping placebo pills, allowing for menses every 3 months 84 tablet 3   omeprazole  (PRILOSEC) 20 MG capsule Take 2 capsules (40 mg total) by mouth daily. 60 capsule 0   ondansetron  (ZOFRAN ) 4 MG tablet TAKE ONE TABLET EVERY EIGHT HOURS AS NEEDED FOR NAUSEA / VOMITING 30 tablet 0   SUMAtriptan  (IMITREX ) 100 MG tablet May repeat in 2 hours if headache persists or recurs.TAKE ONE TABLET EVERY 2 HOURS AS NEEDED FOR MIRGRAINE MAY REPEAT IN 2HR 10 tablet 0   valACYclovir  (VALTREX ) 1000 MG tablet Take 2 pills by mouth twice daily for 1 day for a cold sore 4 tablet 3   No current facility-administered medications on file prior to visit.    Review of Systems  Constitutional:  Negative for chills, fatigue and fever.  HENT:  Negative for sore throat.   Gastrointestinal:  Negative for abdominal pain.  Genitourinary:  Positive for vaginal pain. Negative for dysuria, pelvic pain, vaginal bleeding and vaginal discharge.  Hematological:  Negative for adenopathy.       Objective:   Physical Exam Exam conducted with a chaperone present.  Constitutional:      General: She is not in acute distress.    Appearance: Normal appearance. She is obese. She is not ill-appearing or diaphoretic.  Eyes:     Conjunctiva/sclera: Conjunctivae normal.  Pupils: Pupils are equal, round, and reactive to light.  Cardiovascular:     Rate and Rhythm: Regular rhythm. Tachycardia present.  Pulmonary:     Effort: Pulmonary effort is normal. No respiratory distress.  Abdominal:     Comments:  No suprapubic tenderness or fullness    Genitourinary:    Exam position: Supine.     Pubic Area: No rash.      Labia:        Right: No rash, tenderness or lesion.        Left: No rash, tenderness or lesion.      Urethra: No prolapse or urethral lesion.     Vagina: No vaginal discharge.     Comments: Mild hyperemia of labia minora -moreso on left  Skin:    Findings: No erythema or rash.  Neurological:     Mental Status: She is alert.  Psychiatric:        Mood and Affect: Mood normal.           Assessment & Plan:   Problem List Items Addressed This Visit       Other   Vaginal discomfort - Primary   Discomfort and some odor  History of BV in past with these symptoms Clue cells and positive whiff on wet prep  Reassuring exam  Will treat with oral metronidazole  500 mg bid 7d  Update if not starting to improve in a week or if worsening  Call back and Er precautions noted in detail today        Relevant Orders   POCT Wet Prep [facility]) (Completed)   Other Visit Diagnoses       Need for influenza vaccination       Relevant Orders   Flu vaccine trivalent PF, 6mos and older(Flulaval,Afluria,Fluarix,Fluzone) (Completed)

## 2024-01-06 NOTE — Patient Instructions (Addendum)
 Flu shot today   Eat yogurt A probiotic over the counter may help also   Take the metronidazole  as directed for BV   Update if not starting to improve in a week or if worsening

## 2024-01-31 ENCOUNTER — Ambulatory Visit: Admitting: Family Medicine

## 2024-02-24 ENCOUNTER — Other Ambulatory Visit: Payer: Self-pay | Admitting: Family Medicine

## 2024-03-13 ENCOUNTER — Encounter: Payer: Self-pay | Admitting: Family Medicine

## 2024-03-13 ENCOUNTER — Ambulatory Visit (INDEPENDENT_AMBULATORY_CARE_PROVIDER_SITE_OTHER): Admitting: Family Medicine

## 2024-03-13 VITALS — BP 116/70 | HR 93 | Temp 99.2°F | Ht 61.0 in | Wt 195.4 lb

## 2024-03-13 DIAGNOSIS — R058 Other specified cough: Secondary | ICD-10-CM | POA: Diagnosis not present

## 2024-03-13 DIAGNOSIS — E7800 Pure hypercholesterolemia, unspecified: Secondary | ICD-10-CM | POA: Diagnosis not present

## 2024-03-13 MED ORDER — PREDNISONE 20 MG PO TABS: 20.0000 mg | ORAL_TABLET | Freq: Every day | ORAL | 0 refills | Status: DC

## 2024-03-13 MED ORDER — BENZONATATE 200 MG PO CAPS: 200.0000 mg | ORAL_CAPSULE | Freq: Three times a day (TID) | ORAL | 1 refills | Status: DC | PRN

## 2024-03-13 NOTE — Telephone Encounter (Signed)
Please print for me to sign  Thanks

## 2024-03-13 NOTE — Assessment & Plan Note (Signed)
 Disc goals for lipids and reasons to control them Rev last labs with pt Rev low sat fat diet in detail Taking atorvastatin  20 mg   Planning fasting lab then annual exam

## 2024-03-13 NOTE — Progress Notes (Signed)
 Subjective:    Patient ID: Yvonne Trujillo, female    DOB: 04-05-1986, 38 y.o.   MRN: [id number]  HPI  Here for  Cough Plan labs for cholesterol and PE  Wt Readings from Last 3 Encounters:  03/13/24 195 lb 6 oz (88.6 kg)  01/06/24 193 lb 8 oz (87.8 kg)  12/24/23 195 lb (88.5 kg)   36.92 kg/m  Vitals:   03/13/24 0802  BP: 116/70  Pulse: 93  Temp: 99.2 F (37.3 C)  SpO2: 99%    Immunization History  Administered Date(s) Administered   Influenza, Seasonal, Injecte, Preservative Fre 02/16/2016, 01/06/2024   Influenza,inj,Quad PF,6+ Mos 01/11/2018   Influenza-Unspecified 01/02/2017, 01/17/2020   PFIZER(Purple Top)SARS-COV-2 Vaccination 05/08/2019, 05/29/2019   PPD Test 10/17/2014   Tdap 04/24/2013, 10/28/2017    Health Maintenance Due  Topic Date Due   Hepatitis B Vaccines 19-59 Average Risk (1 of 3 - 19+ 3-dose series) Never done   HPV VACCINES (1 - 3-dose SCDM series) Never done    Was sick 6 wk ago (whole family) Her son ended up getting pneumonia   Cough Dry and barky sounding  Using inhaler Worse at night  No fever  No runny nose  Was wheezing-no longer does that  Comes and goes   No nasal symptoms  No sinus pain  Ears and throat are ok   No acid reflux/no longer on prilosec       Mood    03/13/2024    8:08 AM 01/06/2024    9:44 AM 07/27/2023    2:20 PM 06/18/2023   11:42 AM 11/27/2022    4:04 PM  Depression screen PHQ 2/9  Decreased Interest 0 0 0 0 0  Down, Depressed, Hopeless 0 0 0 0 0  PHQ - 2 Score 0 0 0 0 0  Altered sleeping 0 0 0 0 0  Tired, decreased energy 0 0 0 0 0  Change in appetite 0 0 0 0 0  Feeling bad or failure about yourself  0 0 0 0 0  Trouble concentrating 0 0 0 0 1  Moving slowly or fidgety/restless 0 0 0 0 0  Suicidal thoughts 0 0 0 0 0  PHQ-9 Score 0 0  0  0  1   Difficult doing work/chores Not difficult at all Not difficult at all Not difficult at all Not difficult at all Not difficult at all     Data saved with a  previous flowsheet row definition   Depression/anxiety Bupropion  xl 150 mg daily (300 did not work well for her)  Xanax  prn  Stable overall   Adderall from outside provider    Lab Results  Component Value Date   VITAMINB12 467 11/27/2022    Lab Results  Component Value Date   WBC 20.2 (H) 12/24/2023   HGB 15.0 12/24/2023   HCT 43.8 12/24/2023   MCV 86.1 12/24/2023   PLT 424 (H) 12/24/2023   Hyperlipidemia Lab Results  Component Value Date   CHOL 156 11/27/2022   HDL 42 (L) 11/27/2022   LDLCALC 92 11/27/2022   LDLDIRECT 223.2 04/24/2013   TRIG 121 11/27/2022   CHOLHDL 3.7 11/27/2022   Atorvastatin  20 mg daily   Needs to plan fasting labs      Patient Active Problem List   Diagnosis Date Noted   Post-viral cough syndrome 03/13/2024   Vaginal discomfort 01/06/2024   Urinary frequency 07/21/2023   Elevated platelet count 12/16/2022   Irregular menses 11/13/2021  Morbid obesity (HCC) 12/20/2018   History of gestational hypertension 01/15/2018   Herpes simplex 01/18/2015   Hyperlipidemia 04/25/2013   Migraine 04/24/2013   Depression with anxiety 04/24/2013   Hirsutism 04/24/2013   Acne 04/24/2013   History of kidney stones 04/24/2013   Past Medical History:  Diagnosis Date   Allergy    Anxiety    Asthma    Concussion    Depression    History of depression    Kidney infection    Kidney stone    Migraine    Supervision of normal first pregnancy, antepartum 06/23/2017    Clinic  [facility] Prenatal Labs Dating  Blood type:    Genetic Screen 1 Screen:    AFP:     Quad:     NIPS: Antibody:  Anatomic US   Rubella:   GTT Early:               Third trimester:  RPR:    Flu vaccine  HBsAg:    TDaP vaccine                                               Rhogam: HIV:   Baby Food        Breast                                        GBS: (For PCN allergy, check sensitivities) Cont   Past Surgical History:  Procedure Laterality Date   APPENDECTOMY     EXTRACORPOREAL  SHOCK WAVE LITHOTRIPSY Left 07/09/2016   Procedure: EXTRACORPOREAL SHOCK WAVE LITHOTRIPSY (ESWL);  Surgeon: Rosina Riis, MD;  Location: [facility] ORS;  Service: Urology;  Laterality: Left;   Social History   Tobacco Use   Smoking status: Former    Types: Cigarettes   Smokeless tobacco: Never  Vaping Use   Vaping status: Never Used  Substance Use Topics   Alcohol use: Yes    Comment: occ   Drug use: Not Currently    Comment: occ   Family History  Problem Relation Age of Onset   Alcohol abuse Maternal Uncle    Hyperlipidemia Mother    Hypertension Mother    Heart disease Mother    Hyperlipidemia Father    Hypertension Father    Diabetes Cousin    Bladder Cancer Neg Hx    Kidney cancer Neg Hx    Allergies  Allergen Reactions   Delsym [Dextromethorphan]     Makes her feel bad   Diflucan  [Fluconazole ] Other (See Comments)    Facial redness   Current Outpatient Medications on File Prior to Visit  Medication Sig Dispense Refill   albuterol  (VENTOLIN  HFA) 108 (90 Base) MCG/ACT inhaler Inhale 2 puffs into the lungs every 4 (four) hours as needed for wheezing or shortness of breath (cough, shortness of breath or wheezing.). 18 g 3   ALPRAZolam  (XANAX ) 0.5 MG tablet TAKE ONE TABLET TWICE DAILY AS NEEDED FOR ANXIETY 30 tablet 0   amphetamine-dextroamphetamine (ADDERALL) 10 MG tablet Take 10 mg by mouth 2 (two) times daily.     atorvastatin  (LIPITOR) 20 MG tablet TAKE ONE TABLET BY MOUTH ONCE A DAY 90 tablet 0   buPROPion  (WELLBUTRIN  XL) 150 MG 24 hr tablet TAKE ONE TABLET BY MOUTH ONCE A  DAY 90 tablet 0   cetirizine (ZYRTEC) 10 MG tablet Take 10 mg by mouth daily.     fluticasone  (FLONASE ) 50 MCG/ACT nasal spray Place 2 sprays into both nostrils daily. 16 g 6   NON FORMULARY Inject 40 Units into the skin once a week. SEMAGLUTIDE/B12 3MG /0.5MG /ML INJECTION     norethindrone-ethinyl estradiol -FE (BLISOVI  FE 1/20) 1-20 MG-MCG tablet Take 1 tablet by mouth daily. Skipping placebo pills,  allowing for menses every 3 months 84 tablet 3   ondansetron  (ZOFRAN ) 4 MG tablet TAKE ONE TABLET EVERY EIGHT HOURS AS NEEDED FOR NAUSEA / VOMITING 30 tablet 0   SUMAtriptan  (IMITREX ) 100 MG tablet May repeat in 2 hours if headache persists or recurs.TAKE ONE TABLET EVERY 2 HOURS AS NEEDED FOR MIRGRAINE MAY REPEAT IN 2HR 10 tablet 0   valACYclovir  (VALTREX ) 1000 MG tablet Take 2 pills by mouth twice daily for 1 day for a cold sore 4 tablet 3   No current facility-administered medications on file prior to visit.    Review of Systems  Constitutional:  Negative for activity change, appetite change, fatigue, fever and unexpected weight change.  HENT:  Negative for congestion, ear pain, rhinorrhea, sinus pressure and sore throat.   Eyes:  Negative for pain, redness and visual disturbance.  Respiratory:  Positive for cough and chest tightness. Negative for shortness of breath and wheezing.        Wheezing is better now   Cardiovascular:  Negative for chest pain and palpitations.  Gastrointestinal:  Negative for abdominal pain, blood in stool, constipation and diarrhea.  Endocrine: Negative for polydipsia and polyuria.  Genitourinary:  Negative for dysuria, frequency and urgency.  Musculoskeletal:  Negative for arthralgias, back pain and myalgias.  Skin:  Negative for pallor and rash.  Allergic/Immunologic: Negative for environmental allergies.  Neurological:  Negative for dizziness, syncope and headaches.  Hematological:  Negative for adenopathy. Does not bruise/bleed easily.  Psychiatric/Behavioral:  Negative for decreased concentration and dysphoric mood. The patient is not nervous/anxious.        Objective:   Physical Exam Constitutional:      General: She is not in acute distress.    Appearance: Normal appearance. She is well-developed. She is obese. She is not ill-appearing or diaphoretic.  HENT:     Head: Normocephalic and atraumatic.  Eyes:     Conjunctiva/sclera: Conjunctivae  normal.     Pupils: Pupils are equal, round, and reactive to light.  Neck:     Thyroid : No thyromegaly.     Vascular: No carotid bruit or JVD.  Cardiovascular:     Rate and Rhythm: Normal rate and regular rhythm.     Heart sounds: Normal heart sounds.     No gallop.  Pulmonary:     Effort: Pulmonary effort is normal. No respiratory distress.     Breath sounds: No stridor. Rhonchi present. No wheezing or rales.     Comments: Harsh bs Few scattered rhonchi No wheeze even on forced exp Chest:     Chest wall: No tenderness.  Abdominal:     General: There is no distension or abdominal bruit.     Palpations: Abdomen is soft.  Musculoskeletal:     Cervical back: Normal range of motion and neck supple.     Right lower leg: No edema.     Left lower leg: No edema.  Lymphadenopathy:     Cervical: No cervical adenopathy.  Skin:    General: Skin is warm and dry.  Coloration: Skin is not pale.     Findings: No rash.  Neurological:     Mental Status: She is alert.     Cranial Nerves: No cranial nerve deficit.     Coordination: Coordination normal.     Deep Tendon Reflexes: Reflexes are normal and symmetric. Reflexes normal.  Psychiatric:        Mood and Affect: Mood normal.           Assessment & Plan:   Problem List Items Addressed This Visit       Respiratory   Post-viral cough syndrome - Primary   After URI 6 weeks ago  Dry cough (had wheezing at first) , barky sounding  No other symptoms Already takes zyrtec for allergies -denies pnd or gerd symptoms   Suspect post viral cough/ bronchitis  Reassuring exam Will try and stop cough cycle  Prescription prednisone  20 mg daily 7d (aware of pot side effects) Benzonatate  200 mg tid  Delsym prn   Update if not starting to improve in a week or if worsening  Call back and Er precautions noted in detail today   Handout given           Other   Hyperlipidemia   Disc goals for lipids and reasons to control them Rev  last labs with pt Rev low sat fat diet in detail Taking atorvastatin  20 mg   Planning fasting lab then annual exam

## 2024-03-13 NOTE — Telephone Encounter (Signed)
Form printed and placed in your inbox for review  ?

## 2024-03-13 NOTE — Assessment & Plan Note (Signed)
 After URI 6 weeks ago  Dry cough (had wheezing at first) , barky sounding  No other symptoms Already takes zyrtec for allergies -denies pnd or gerd symptoms   Suspect post viral cough/ bronchitis  Reassuring exam Will try and stop cough cycle  Prescription prednisone  20 mg daily 7d (aware of pot side effects) Benzonatate  200 mg tid  Delsym prn   Update if not starting to improve in a week or if worsening  Call back and Er precautions noted in detail today   Handout given

## 2024-03-13 NOTE — Patient Instructions (Addendum)
 Continue zyrtec 10 mg at betime  Take prednisone  20 mg daily in the am   Benzonatate  pearles 200 mg three times daily as needed   Get delsym over the counter and take at night / or as needed   Schedule a physical with fasting labs prior

## 2024-03-14 ENCOUNTER — Ambulatory Visit: Admitting: Family Medicine

## 2024-03-14 NOTE — Telephone Encounter (Signed)
 Form was printed and placed in your inbox yesterday, please let me know if you do not have it and I can print another one

## 2024-03-16 NOTE — Telephone Encounter (Signed)
 Done and in IN box

## 2024-04-11 ENCOUNTER — Ambulatory Visit: Admitting: Family Medicine

## 2024-04-14 ENCOUNTER — Encounter: Payer: Self-pay | Admitting: Family Medicine

## 2024-04-14 ENCOUNTER — Ambulatory Visit: Admitting: Family Medicine

## 2024-04-14 VITALS — BP 114/68 | HR 89 | Temp 98.5°F | Ht 61.0 in | Wt 202.2 lb

## 2024-04-14 DIAGNOSIS — N949 Unspecified condition associated with female genital organs and menstrual cycle: Secondary | ICD-10-CM

## 2024-04-14 DIAGNOSIS — B9689 Other specified bacterial agents as the cause of diseases classified elsewhere: Secondary | ICD-10-CM | POA: Diagnosis not present

## 2024-04-14 DIAGNOSIS — N760 Acute vaginitis: Secondary | ICD-10-CM

## 2024-04-14 LAB — POCT WET PREP (WET MOUNT)
Clue Cells Wet Prep Whiff POC: POSITIVE
Trichomonas Wet Prep HPF POC: ABSENT

## 2024-04-14 MED ORDER — METRONIDAZOLE 0.75 % VA GEL: 1.0000 | VAGINAL | 3 refills | Status: AC

## 2024-04-14 MED ORDER — METRONIDAZOLE 500 MG PO TABS: 500.0000 mg | ORAL_TABLET | Freq: Two times a day (BID) | ORAL | 0 refills | Status: AC

## 2024-04-14 NOTE — Assessment & Plan Note (Signed)
 Recurrent Had flare in sept -did improve with oral metronizadole Irritation/ odor    Clue cells on wet prep Taking a probiotic Has tried over the counter boric acid in past   Will treat with oral flagyl  500 mg bid 7d (avoid etoh) Then for proph try metro gel vaginal 1 applic twice weekly - see if this helps/ 4-6 mo   Meds ordered this encounter  Medications   metroNIDAZOLE  (FLAGYL ) 500 MG tablet    Sig: Take 1 tablet (500 mg total) by mouth 2 (two) times daily for 7 days.    Dispense:  14 tablet    Refill:  0   metroNIDAZOLE  (METROGEL ) 0.75 % vaginal gel    Sig: Place 1 Applicatorful vaginally 2 (two) times a week.    Dispense:  70 g    Refill:  3   Update if not starting to improve in a week or if worsening  Call back and Er precautions noted in detail today

## 2024-04-14 NOTE — Patient Instructions (Signed)
 Keep using probiotics if they help   Take the oral flagyl  for this bout of BV  Then try the metro gel vaginal twice weekly to prevention    If symptoms do not improve let us  know

## 2024-04-14 NOTE — Progress Notes (Signed)
 Subjective:    Patient ID: Yvonne Trujillo, female    DOB: [birth year], 38 y.o.   MRN: [id number]  HPI  Wt Readings from Last 3 Encounters:  04/14/24 202 lb 4 oz (91.7 kg)  03/13/24 195 lb 6 oz (88.6 kg)  01/06/24 193 lb 8 oz (87.8 kg)   38.21 kg/m  Vitals:   04/14/24 0844  BP: 114/68  Pulse: 89  Temp: 98.5 F (36.9 C)  SpO2: 100%    Pt presents with c/o   Vaginal symptoms - started last week  Discomfort (on and off) - pain   Faint odor  No discharge   No lesions  No bleeding  No risk of STDs    No low abd pain or cramping   Taking some oral probiotics   No boric acid suppositories (used to get over the counter and unsure if it helped)   Results for orders placed or performed in visit on 04/14/24  POCT Wet Prep [employer])   Collection Time: 04/14/24  9:04 AM  Result Value Ref Range   Source Wet Prep POC vaginal    WBC, Wet Prep HPF POC few    Bacteria Wet Prep HPF POC Moderate (A) Few   Bacteria Wet Prep Morphology POC     Clue Cells Wet Prep HPF POC Moderate (A) None   Clue Cells Wet Prep Whiff POC Positive Whiff    Yeast Wet Prep HPF POC None None   KOH Wet Prep POC None None   Trichomonas Wet Prep HPF POC Absent Absent     Patient Active Problem List   Diagnosis Date Noted   Post-viral cough syndrome 03/13/2024   Bacterial vaginosis 01/06/2024   Urinary frequency 07/21/2023   Elevated platelet count 12/16/2022   Irregular menses 11/13/2021   Morbid obesity (HCC) 12/20/2018   History of gestational hypertension 01/15/2018   Herpes simplex 01/18/2015   Hyperlipidemia 04/25/2013   Migraine 04/24/2013   Depression with anxiety 04/24/2013   Hirsutism 04/24/2013   Acne 04/24/2013   History of kidney stones 04/24/2013   Past Medical History:  Diagnosis Date   Allergy    Anxiety    Asthma    Concussion    Depression    History of depression    Kidney infection    Kidney stone    Migraine    Supervision of normal first pregnancy, antepartum  06/23/2017    Clinic  [facility] Prenatal Labs Dating  Blood type:    Genetic Screen 1 Screen:    AFP:     Quad:     NIPS: Antibody:  Anatomic US   Rubella:   GTT Early:               Third trimester:  RPR:    Flu vaccine  HBsAg:    TDaP vaccine                                               Rhogam: HIV:   Baby Food        Breast                                        GBS: (For PCN allergy, check sensitivities) Cont   Past Surgical History:  Procedure Laterality  Date   APPENDECTOMY     EXTRACORPOREAL SHOCK WAVE LITHOTRIPSY Left 07/09/2016   Procedure: EXTRACORPOREAL SHOCK WAVE LITHOTRIPSY (ESWL);  Surgeon: Rosina Riis, MD;  Location: [facility];  Service: Urology;  Laterality: Left;   Social History[1] Family History  Problem Relation Age of Onset   Alcohol abuse Maternal Uncle    Hyperlipidemia Mother    Hypertension Mother    Heart disease Mother    Hyperlipidemia Father    Hypertension Father    Diabetes Cousin    Bladder Cancer Neg Hx    Kidney cancer Neg Hx    Allergies[2] Medications Ordered Prior to Encounter[3]  Review of Systems  Constitutional:  Negative for fatigue and fever.  Genitourinary:  Positive for vaginal pain. Negative for decreased urine volume, difficulty urinating, dysuria, vaginal bleeding and vaginal discharge.  Skin:  Negative for rash.       Objective:   Physical Exam Exam conducted with a chaperone present.  Constitutional:      General: She is not in acute distress.    Appearance: Normal appearance. She is obese. She is not ill-appearing or diaphoretic.  Genitourinary:    Exam position: Supine.     Pubic Area: No rash.      Labia:        Right: No rash, tenderness, lesion or injury.        Left: No rash, tenderness, lesion or injury.      Urethra: No prolapse.     Comments: Normal appearing vaginal mucosa   Wet prep obtained Clue cells  Positive whiff Skin:    Findings: No rash.  Neurological:     Mental Status: She is alert.  Psychiatric:         Mood and Affect: Mood normal.           Assessment & Plan:   Problem List Items Addressed This Visit       Genitourinary   Bacterial vaginosis - Primary   Recurrent Had flare in sept -did improve with oral metronizadole Irritation/ odor    Clue cells on wet prep Taking a probiotic Has tried over the counter boric acid in past   Will treat with oral flagyl  500 mg bid 7d (avoid etoh) Then for proph try metro gel vaginal 1 applic twice weekly - see if this helps/ 4-6 mo   Meds ordered this encounter  Medications   metroNIDAZOLE  (FLAGYL ) 500 MG tablet    Sig: Take 1 tablet (500 mg total) by mouth 2 (two) times daily for 7 days.    Dispense:  14 tablet    Refill:  0   metroNIDAZOLE  (METROGEL ) 0.75 % vaginal gel    Sig: Place 1 Applicatorful vaginally 2 (two) times a week.    Dispense:  70 g    Refill:  3   Update if not starting to improve in a week or if worsening  Call back and Er precautions noted in detail today          Relevant Medications   metroNIDAZOLE  (FLAGYL ) 500 MG tablet      [1]  Social History Tobacco Use   Smoking status: Former    Types: Cigarettes   Smokeless tobacco: Never  Vaping Use   Vaping status: Never Used  Substance Use Topics   Alcohol use: Yes    Comment: occ   Drug use: Not Currently    Comment: occ  [2]  Allergies Allergen Reactions   Delsym [Dextromethorphan]     Makes  her feel bad   Diflucan  [Fluconazole ] Other (See Comments)    Facial redness  [3]  Current Outpatient Medications on File Prior to Visit  Medication Sig Dispense Refill   albuterol  (VENTOLIN  HFA) 108 (90 Base) MCG/ACT inhaler Inhale 2 puffs into the lungs every 4 (four) hours as needed for wheezing or shortness of breath (cough, shortness of breath or wheezing.). 18 g 3   ALPRAZolam  (XANAX ) 0.5 MG tablet TAKE ONE TABLET TWICE DAILY AS NEEDED FOR ANXIETY 30 tablet 0   amphetamine-dextroamphetamine (ADDERALL) 10 MG tablet Take 10 mg by mouth 2 (two)  times daily.     atorvastatin  (LIPITOR) 20 MG tablet TAKE ONE TABLET BY MOUTH ONCE A DAY 90 tablet 0   buPROPion  (WELLBUTRIN  XL) 150 MG 24 hr tablet TAKE ONE TABLET BY MOUTH ONCE A DAY 90 tablet 0   cetirizine (ZYRTEC) 10 MG tablet Take 10 mg by mouth daily.     fluticasone  (FLONASE ) 50 MCG/ACT nasal spray Place 2 sprays into both nostrils daily. 16 g 6   NON FORMULARY Inject 40 Units into the skin once a week. SEMAGLUTIDE/B12 3MG /0.5MG /ML INJECTION     norethindrone-ethinyl estradiol -FE (BLISOVI  FE 1/20) 1-20 MG-MCG tablet Take 1 tablet by mouth daily. Skipping placebo pills, allowing for menses every 3 months 84 tablet 3   ondansetron  (ZOFRAN ) 4 MG tablet TAKE ONE TABLET EVERY EIGHT HOURS AS NEEDED FOR NAUSEA / VOMITING 30 tablet 0   SUMAtriptan  (IMITREX ) 100 MG tablet May repeat in 2 hours if headache persists or recurs.TAKE ONE TABLET EVERY 2 HOURS AS NEEDED FOR MIRGRAINE MAY REPEAT IN 2HR 10 tablet 0   valACYclovir  (VALTREX ) 1000 MG tablet Take 2 pills by mouth twice daily for 1 day for a cold sore 4 tablet 3   No current facility-administered medications on file prior to visit.

## 2024-05-01 ENCOUNTER — Other Ambulatory Visit: Payer: Self-pay | Admitting: Obstetrics and Gynecology

## 2024-05-01 DIAGNOSIS — Z30011 Encounter for initial prescription of contraceptive pills: Secondary | ICD-10-CM

## 2024-05-05 ENCOUNTER — Encounter: Payer: Self-pay | Admitting: Family Medicine

## 2024-05-05 ENCOUNTER — Encounter: Payer: Self-pay | Admitting: Obstetrics and Gynecology

## 2024-05-05 DIAGNOSIS — Z30011 Encounter for initial prescription of contraceptive pills: Secondary | ICD-10-CM

## 2024-05-05 MED ORDER — BLISOVI FE 1/20 1-20 MG-MCG PO TABS: 1.0000 | ORAL_TABLET | Freq: Every day | ORAL | 0 refills | Status: AC

## 2024-05-10 ENCOUNTER — Encounter: Payer: Self-pay | Admitting: Family Medicine

## 2024-05-12 ENCOUNTER — Other Ambulatory Visit: Payer: Self-pay | Admitting: *Deleted

## 2024-05-12 MED ORDER — NORETHINDRONE ACET-ETHINYL EST 1-20 MG-MCG PO TABS: 1.0000 | ORAL_TABLET | Freq: Every day | ORAL | 3 refills | Status: AC

## 2024-05-12 NOTE — Progress Notes (Signed)
 erroneous

## 2024-05-24 ENCOUNTER — Other Ambulatory Visit: Payer: Self-pay | Admitting: Family Medicine

## 2024-05-25 NOTE — Telephone Encounter (Signed)
 Patient has not had fasting labs in over a year ok to refill?

## 2024-05-25 NOTE — Telephone Encounter (Signed)
 Please schedule annual exam when able winter/spring Fasting labs prior or that day, her choice Thanks

## 2024-06-20 ENCOUNTER — Other Ambulatory Visit

## 2024-06-27 ENCOUNTER — Encounter: Admitting: Family Medicine

## 2024-07-27 ENCOUNTER — Encounter: Admitting: Family Medicine
